# Patient Record
Sex: Male | Born: 1935 | Race: White | Hispanic: No | Marital: Married | Smoking: Former smoker
Health system: Southern US, Community
[De-identification: ages and names within clinical notes are randomized; demographics above are authoritative.]

## PROBLEM LIST (undated history)

## (undated) DIAGNOSIS — R011 Cardiac murmur, unspecified: Secondary | ICD-10-CM

## (undated) DIAGNOSIS — K219 Gastro-esophageal reflux disease without esophagitis: Secondary | ICD-10-CM

## (undated) DIAGNOSIS — D332 Benign neoplasm of brain, unspecified: Secondary | ICD-10-CM

## (undated) DIAGNOSIS — E785 Hyperlipidemia, unspecified: Secondary | ICD-10-CM

## (undated) DIAGNOSIS — I10 Essential (primary) hypertension: Secondary | ICD-10-CM

## (undated) DIAGNOSIS — J302 Other seasonal allergic rhinitis: Secondary | ICD-10-CM

## (undated) DIAGNOSIS — M1990 Unspecified osteoarthritis, unspecified site: Secondary | ICD-10-CM

## (undated) DIAGNOSIS — J449 Chronic obstructive pulmonary disease, unspecified: Secondary | ICD-10-CM

## (undated) HISTORY — DX: Benign neoplasm of brain, unspecified: D33.2

## (undated) HISTORY — PX: ORIF ANKLE FRACTURE: SUR919

## (undated) HISTORY — PX: HERNIA REPAIR: SHX51

## (undated) HISTORY — DX: Hyperlipidemia, unspecified: E78.5

---

## 2002-01-05 LAB — HM COLONOSCOPY

## 2006-10-29 ENCOUNTER — Emergency Department: Payer: Self-pay | Admitting: Emergency Medicine

## 2006-10-29 ENCOUNTER — Other Ambulatory Visit: Payer: Self-pay

## 2006-12-09 ENCOUNTER — Emergency Department: Payer: Self-pay | Admitting: Emergency Medicine

## 2008-07-22 ENCOUNTER — Ambulatory Visit: Payer: Self-pay | Admitting: Family Medicine

## 2008-10-18 ENCOUNTER — Ambulatory Visit: Payer: Self-pay | Admitting: Family Medicine

## 2012-03-15 ENCOUNTER — Ambulatory Visit: Payer: Self-pay | Admitting: Internal Medicine

## 2012-03-20 ENCOUNTER — Ambulatory Visit: Payer: Self-pay | Admitting: Family Medicine

## 2012-03-21 ENCOUNTER — Inpatient Hospital Stay: Payer: Self-pay | Admitting: Student

## 2012-03-21 ENCOUNTER — Ambulatory Visit: Payer: Self-pay | Admitting: Family Medicine

## 2012-03-21 LAB — CBC WITH DIFFERENTIAL/PLATELET
Eosinophil %: 1.4 %
Lymphocyte %: 14.4 %
Monocyte %: 8.2 %
Neutrophil %: 75.6 %
WBC: 8.6 10*3/uL (ref 3.8–10.6)

## 2012-03-21 LAB — URINALYSIS, COMPLETE
Bilirubin,UR: NEGATIVE
Glucose,UR: NEGATIVE mg/dL (ref 0–75)
Nitrite: NEGATIVE
Ph: 8 (ref 4.5–8.0)
Protein: NEGATIVE
Specific Gravity: 1.004 (ref 1.003–1.030)
WBC UR: 1 /HPF (ref 0–5)

## 2012-03-21 LAB — HEPATIC FUNCTION PANEL A (ARMC)
Albumin: 3.3 g/dL — ABNORMAL LOW (ref 3.4–5.0)
Alkaline Phosphatase: 98 U/L (ref 50–136)
Bilirubin, Direct: 0.2 mg/dL (ref 0.00–0.20)
Bilirubin,Total: 0.6 mg/dL (ref 0.2–1.0)
SGOT(AST): 16 U/L (ref 15–37)
SGPT (ALT): 12 U/L
Total Protein: 7.4 g/dL (ref 6.4–8.2)

## 2012-03-21 LAB — BASIC METABOLIC PANEL
Calcium, Total: 8.6 mg/dL (ref 8.5–10.1)
Chloride: 103 mmol/L (ref 98–107)
Co2: 27 mmol/L (ref 21–32)
Creatinine: 2.17 mg/dL — ABNORMAL HIGH (ref 0.60–1.30)
EGFR (African American): 33 — ABNORMAL LOW
Osmolality: 282 (ref 275–301)
Potassium: 3.5 mmol/L (ref 3.5–5.1)

## 2012-03-22 ENCOUNTER — Ambulatory Visit: Payer: Self-pay | Admitting: Urology

## 2012-03-22 LAB — CBC WITH DIFFERENTIAL/PLATELET
Eosinophil %: 2.5 %
Lymphocyte %: 23.5 %
MCH: 30 pg (ref 26.0–34.0)
Monocyte #: 0.9 x10 3/mm (ref 0.2–1.0)
Monocyte %: 11.4 %
Neutrophil #: 4.7 10*3/uL (ref 1.4–6.5)
Platelet: 149 10*3/uL — ABNORMAL LOW (ref 150–440)

## 2012-03-22 LAB — BASIC METABOLIC PANEL
Anion Gap: 9 (ref 7–16)
Calcium, Total: 8.5 mg/dL (ref 8.5–10.1)
Chloride: 106 mmol/L (ref 98–107)
EGFR (African American): 33 — ABNORMAL LOW
EGFR (Non-African Amer.): 28 — ABNORMAL LOW
Glucose: 94 mg/dL (ref 65–99)
Osmolality: 281 (ref 275–301)

## 2012-03-23 LAB — BASIC METABOLIC PANEL
Chloride: 109 mmol/L — ABNORMAL HIGH (ref 98–107)
Co2: 23 mmol/L (ref 21–32)
EGFR (African American): 33 — ABNORMAL LOW
EGFR (Non-African Amer.): 29 — ABNORMAL LOW
Potassium: 3.4 mmol/L — ABNORMAL LOW (ref 3.5–5.1)

## 2012-03-24 LAB — BASIC METABOLIC PANEL
Anion Gap: 9 (ref 7–16)
BUN: 25 mg/dL — ABNORMAL HIGH (ref 7–18)
Chloride: 107 mmol/L (ref 98–107)
Co2: 25 mmol/L (ref 21–32)
EGFR (African American): 32 — ABNORMAL LOW
EGFR (Non-African Amer.): 28 — ABNORMAL LOW
Glucose: 97 mg/dL (ref 65–99)
Osmolality: 286 (ref 275–301)
Potassium: 3.7 mmol/L (ref 3.5–5.1)
Sodium: 141 mmol/L (ref 136–145)

## 2012-03-24 LAB — PSA: PSA: 2.3 ng/mL (ref 0.0–4.0)

## 2012-03-25 ENCOUNTER — Other Ambulatory Visit: Payer: Self-pay | Admitting: Gastroenterology

## 2012-03-25 LAB — BASIC METABOLIC PANEL
Anion Gap: 7 (ref 7–16)
Chloride: 107 mmol/L (ref 98–107)
EGFR (Non-African Amer.): 28 — ABNORMAL LOW
Glucose: 108 mg/dL — ABNORMAL HIGH (ref 65–99)
Potassium: 3.8 mmol/L (ref 3.5–5.1)

## 2012-03-26 LAB — BASIC METABOLIC PANEL
Anion Gap: 8 (ref 7–16)
Osmolality: 281 (ref 275–301)
Potassium: 4.1 mmol/L (ref 3.5–5.1)

## 2012-03-27 ENCOUNTER — Encounter (HOSPITAL_COMMUNITY)
Admission: RE | Disposition: A | Discharge status: Home / Self Care | Payer: Self-pay | Source: Ambulatory Visit | Attending: Gastroenterology

## 2012-03-27 ENCOUNTER — Ambulatory Visit (HOSPITAL_COMMUNITY)
Admission: RE | Admit: 2012-03-27 | Discharge: 2012-03-27 | Disposition: A | Discharge status: Home / Self Care | Payer: Medicare Other | Source: Ambulatory Visit | Attending: Gastroenterology | Admitting: Gastroenterology

## 2012-03-27 ENCOUNTER — Encounter (HOSPITAL_COMMUNITY): Payer: Self-pay | Admitting: *Deleted

## 2012-03-27 ENCOUNTER — Ambulatory Visit (HOSPITAL_COMMUNITY): Payer: Medicare Other | Admitting: *Deleted

## 2012-03-27 ENCOUNTER — Encounter (HOSPITAL_COMMUNITY): Payer: Self-pay | Admitting: Gastroenterology

## 2012-03-27 DIAGNOSIS — R109 Unspecified abdominal pain: Secondary | ICD-10-CM | POA: Insufficient documentation

## 2012-03-27 DIAGNOSIS — K219 Gastro-esophageal reflux disease without esophagitis: Secondary | ICD-10-CM | POA: Insufficient documentation

## 2012-03-27 DIAGNOSIS — K869 Disease of pancreas, unspecified: Secondary | ICD-10-CM | POA: Insufficient documentation

## 2012-03-27 DIAGNOSIS — I10 Essential (primary) hypertension: Secondary | ICD-10-CM | POA: Insufficient documentation

## 2012-03-27 DIAGNOSIS — R634 Abnormal weight loss: Secondary | ICD-10-CM | POA: Insufficient documentation

## 2012-03-27 DIAGNOSIS — J4489 Other specified chronic obstructive pulmonary disease: Secondary | ICD-10-CM | POA: Insufficient documentation

## 2012-03-27 DIAGNOSIS — J449 Chronic obstructive pulmonary disease, unspecified: Secondary | ICD-10-CM | POA: Insufficient documentation

## 2012-03-27 DIAGNOSIS — R933 Abnormal findings on diagnostic imaging of other parts of digestive tract: Secondary | ICD-10-CM

## 2012-03-27 DIAGNOSIS — I779 Disorder of arteries and arterioles, unspecified: Secondary | ICD-10-CM | POA: Insufficient documentation

## 2012-03-27 HISTORY — DX: Cardiac murmur, unspecified: R01.1

## 2012-03-27 HISTORY — PX: FINE NEEDLE ASPIRATION: SHX5430

## 2012-03-27 HISTORY — DX: Unspecified osteoarthritis, unspecified site: M19.90

## 2012-03-27 HISTORY — PX: EUS: SHX5427

## 2012-03-27 HISTORY — DX: Chronic obstructive pulmonary disease, unspecified: J44.9

## 2012-03-27 HISTORY — DX: Gastro-esophageal reflux disease without esophagitis: K21.9

## 2012-03-27 HISTORY — DX: Other seasonal allergic rhinitis: J30.2

## 2012-03-27 HISTORY — DX: Essential (primary) hypertension: I10

## 2012-03-27 LAB — COMPREHENSIVE METABOLIC PANEL
ALT: 12 U/L (ref 0–53)
AST: 14 U/L (ref 0–37)
Alkaline Phosphatase: 79 U/L (ref 39–117)
CO2: 22 mEq/L (ref 19–32)
GFR calc Af Amer: 32 mL/min — ABNORMAL LOW (ref >90)
Glucose, Bld: 106 mg/dL — ABNORMAL HIGH (ref 70–99)
Potassium: 3.8 mEq/L (ref 3.5–5.1)
Sodium: 136 mEq/L (ref 135–145)
Total Protein: 7.5 g/dL (ref 6.0–8.3)

## 2012-03-27 LAB — CBC
Hemoglobin: 11.4 g/dL — ABNORMAL LOW (ref 13.0–17.0)
RBC: 3.85 MIL/uL — ABNORMAL LOW (ref 4.22–5.81)

## 2012-03-27 SURGERY — UPPER ENDOSCOPIC ULTRASOUND (EUS) LINEAR
Anesthesia: Monitor Anesthesia Care

## 2012-03-27 MED ORDER — LIDOCAINE HCL (CARDIAC) 20 MG/ML IV SOLN
INTRAVENOUS | Status: DC | PRN
  Administered 2012-03-27: 30 mg via INTRAVENOUS

## 2012-03-27 MED ORDER — ONDANSETRON HCL 4 MG/2ML IJ SOLN
INTRAMUSCULAR | Status: DC | PRN
  Administered 2012-03-27: 4 mg via INTRAVENOUS

## 2012-03-27 MED ORDER — KETAMINE HCL 10 MG/ML IJ SOLN
INTRAMUSCULAR | Status: DC | PRN
  Administered 2012-03-27 (×7): 5 mg via INTRAVENOUS
  Administered 2012-03-27: 10 mg via INTRAVENOUS
  Administered 2012-03-27 (×3): 5 mg via INTRAVENOUS
  Administered 2012-03-27: 15 mg via INTRAVENOUS
  Administered 2012-03-27 (×4): 5 mg via INTRAVENOUS
  Administered 2012-03-27: 30 mg via INTRAVENOUS

## 2012-03-27 MED ORDER — PROPOFOL 10 MG/ML IV EMUL
INTRAVENOUS | Status: DC | PRN
  Administered 2012-03-27: 300 ug/kg/min via INTRAVENOUS

## 2012-03-27 MED ORDER — LACTATED RINGERS IV SOLN
INTRAVENOUS | Status: DC | PRN
  Administered 2012-03-27 (×2): via INTRAVENOUS

## 2012-03-27 MED ORDER — BUTAMBEN-TETRACAINE-BENZOCAINE 2-2-14 % EX AERO
INHALATION_SPRAY | CUTANEOUS | Status: DC | PRN
  Administered 2012-03-27: 2 via TOPICAL

## 2012-03-27 MED ORDER — GLYCOPYRROLATE 0.2 MG/ML IJ SOLN
INTRAMUSCULAR | Status: DC | PRN
  Administered 2012-03-27 (×4): .025 mg via INTRAVENOUS

## 2012-03-27 MED ORDER — LACTATED RINGERS IV SOLN
INTRAVENOUS | Status: DC
  Administered 2012-03-27: 1000 mL via INTRAVENOUS

## 2012-03-27 NOTE — Anesthesia Postprocedure Evaluation (Signed)
  Anesthesia Post-op Note  Patient: Mark Sanchez  Procedure(s) Performed: Procedure(s) (LRB): UPPER ENDOSCOPIC ULTRASOUND (EUS) LINEAR (N/A) FINE NEEDLE ASPIRATION (FNA) LINEAR (N/A)  Patient Location: PACU  Anesthesia Type: MAC  Level of Consciousness: awake and alert   Airway and Oxygen Therapy: Patient Spontanous Breathing  Post-op Pain: mild  Post-op Assessment: Post-op Vital signs reviewed, Patient's Cardiovascular Status Stable, Respiratory Function Stable, Patent Airway and No signs of Nausea or vomiting  Post-op Vital Signs: stable  Complications: No apparent anesthesia complications

## 2012-03-27 NOTE — Discharge Instructions (Signed)

## 2012-03-27 NOTE — Anesthesia Preprocedure Evaluation (Addendum)
Anesthesia Evaluation  Patient identified by MRN, date of birth, ID band Patient awake    Reviewed: Allergy & Precautions, H&P , NPO status , Patient's Chart, lab work & pertinent test results  Airway Mallampati: III TM Distance: >3 FB Neck ROM: Full    Dental No notable dental hx.    Pulmonary neg pulmonary ROS, COPD breath sounds clear to auscultation  Pulmonary exam normal       Cardiovascular hypertension, Pt. on medications negative cardio ROS  Rhythm:Regular Rate:Normal     Neuro/Psych negative neurological ROS  negative psych ROS   GI/Hepatic negative GI ROS, Neg liver ROS, GERD-  ,  Endo/Other  negative endocrine ROS  Renal/GU negative Renal ROS  negative genitourinary   Musculoskeletal negative musculoskeletal ROS (+)   Abdominal   Peds negative pediatric ROS (+)  Hematology negative hematology ROS (+)   Anesthesia Other Findings Crowns upper front  Reproductive/Obstetrics negative OB ROS                           Anesthesia Physical Anesthesia Plan  ASA: II  Anesthesia Plan: MAC   Post-op Pain Management:    Induction: Intravenous  Airway Management Planned: Simple Face Mask  Additional Equipment:   Intra-op Plan:   Post-operative Plan:   Informed Consent: I have reviewed the patients History and Physical, chart, labs and discussed the procedure including the risks, benefits and alternatives for the proposed anesthesia with the patient or authorized representative who has indicated his/her understanding and acceptance.   Dental advisory given  Plan Discussed with: CRNA  Anesthesia Plan Comments:         Anesthesia Quick Evaluation

## 2012-03-27 NOTE — Op Note (Signed)
[facility] [address] [place], [place]  [address]  ENDOSCOPIC ULTRASOUND PROCEDURE REPORT  PATIENT:  Mark Sanchez, Mark Sanchez  MR#:  [id number] BIRTHDATE:  05/16/1933  GENDER:  male ENDOSCOPIST:  Rachael Fee, MD REFERRED BY:  Janese Banks, M.D. PROCEDURE DATE:  03/27/2012 PROCEDURE:  Upper EUS w/FNA ASA CLASS:  Class III INDICATIONS:  abd pain for 2-3 months, weight loss, recent PET scan showing PET avid soft tissue around aorta and PET avid "mass" in body, tail of pancreas MEDICATIONS:   MAC sedation, administered by CRNA  DESCRIPTION OF PROCEDURE:   After the risks benefits and alternatives of the procedure were  explained, informed consent was obtained. The patient was then placed in the left, lateral, decubitus postion and IV sedation was administered. Throughout the procedure, the patient's blood pressure, pulse and oxygen saturations were monitored continuously.  Under direct visualization, the 110036 endoscope was introduced through the mouth and advanced to the second portion of the duodenum.  Water was used as necessary to provide an acoustic interface.  Upon completion of the imaging, water was removed and the patient was sent to the recovery room in satisfactory condition. <<PROCEDUREIMAGES>>  Endoscopic findings (with radial and linear echoendoscopes): 1. Normal UGI tract  EUS findings: 1. Poorly defined hypoechoic mass in body, tail of pancreas. This measures at least 2.5cm but perhaps as much as 5cm (borders of mass very ill-defined).  This surround splenic vessles but does not clearly involve the portal vein, SMV, celiac trunk or SMA. The mass was sampled with 5 passes of a 19 gauge BS EUS FNA needle using color doppler to avoid significant vessels. 2. Main pancreatic duct was normal; non-dilated 3. No peripancreatic or celiac adenopathy 4. There was soft tissue surrounding abdominal aorta, 6-31mm thick. 5. Limited views of liver, spleen, portal  and splenic vessels were all normal  Impression: Poorly defined mass in body, tail of pancreas that measures 2.5cm to 5cm (indistinct borers).  This surrounds splenic vesses but does not appear to involve SMV, PV, SMA, or celiac trunk.  No peripancreatic or celiac adenopathy. Preliminary cytology review shows no clearly malignant cells. Await final cytology.  ______________________________ Rachael Fee, MD  n. eSIGNED:   Rachael Fee at 03/27/2012 10:26 AM  [name], [id number]

## 2012-03-27 NOTE — Transfer of Care (Signed)
Immediate Anesthesia Transfer of Care Note  Patient: Mark Sanchez  Procedure(s) Performed: Procedure(s) (LRB): UPPER ENDOSCOPIC ULTRASOUND (EUS) LINEAR (N/A)  Patient Location: PACU and Endoscopy Unit  Anesthesia Type: MAC  Level of Consciousness: sedated, ventilating well with OAW  Airway & Oxygen Therapy: Patient Spontanous Breathing  Post-op Assessment: Report given to PACU RN, Post -op Vital signs reviewed and stable and Patient moving all extremities  Post vital signs: Reviewed and stable  Complications: No apparent anesthesia complications

## 2012-03-27 NOTE — Preoperative (Signed)
Beta Blockers   Reason not to administer Beta Blockers:Not Applicable, pt took BB at home this am

## 2012-03-27 NOTE — H&P (Signed)
  HPI: This is a nice 78 yo man with abd pain (3-4 months) weight loss, abnormal PET uptake in pancreas    Past Medical History  Diagnosis Date  . Hypertension   . COPD (chronic obstructive pulmonary disease)   . Seasonal allergies   . Heart murmur   . GERD (gastroesophageal reflux disease)   . Arthritis     left ankle    Past Surgical History  Procedure Date  . Hernia repair   . Orif ankle fracture     Current Facility-Administered Medications  Medication Dose Route Frequency Provider Last Rate Last Dose  . lactated ringers infusion   Intravenous Continuous Rachael Fee, MD 20 mL/hr at 03/27/12 0744 1,000 mL at 03/27/12 0744    Allergies as of 03/25/2012  . (Not on File)    No family history on file.  History   Social History  . Marital Status: Married    Spouse Name: N/A    Number of Children: N/A  . Years of Education: N/A   Occupational History  . Not on file.   Social History Main Topics  . Smoking status: Not on file  . Smokeless tobacco: Not on file  . Alcohol Use:   . Drug Use:   . Sexually Active:    Other Topics Concern  . Not on file   Social History Narrative  . No narrative on file      Physical Exam: BP 153/76  Temp 98.2 F (36.8 C) (Oral)  Resp 15  Ht 5\' 11"  (1.803 m)  Wt 136 lb (61.689 kg)  BMI 18.97 kg/m2  SpO2 99% Constitutional: generally well-appearing Psychiatric: alert and oriented x3 Abdomen: soft, nontender, nondistended, no obvious ascites, no peritoneal signs, normal bowel sounds     Assessment and plan: 78 y.o. male with PET avid panacreas, weight loss, abd pain   for upper EUS, fna today

## 2012-03-28 ENCOUNTER — Encounter (HOSPITAL_COMMUNITY): Payer: Self-pay | Admitting: Gastroenterology

## 2012-04-01 ENCOUNTER — Ambulatory Visit: Payer: Self-pay | Admitting: Internal Medicine

## 2012-04-01 LAB — COMPREHENSIVE METABOLIC PANEL
Bilirubin,Total: 0.3 mg/dL (ref 0.2–1.0)
Co2: 26 mmol/L (ref 21–32)
EGFR (Non-African Amer.): 46 — ABNORMAL LOW
Potassium: 4.4 mmol/L (ref 3.5–5.1)
SGPT (ALT): 20 U/L
Total Protein: 7.5 g/dL (ref 6.4–8.2)

## 2012-04-01 LAB — CBC CANCER CENTER
Basophil %: 0.8 %
Eosinophil #: 0.7 x10 3/mm (ref 0.0–0.7)
Eosinophil %: 7 %
HCT: 33.1 % — ABNORMAL LOW (ref 40.0–52.0)
Lymphocyte #: 1.4 x10 3/mm (ref 1.0–3.6)
Lymphocyte %: 15.1 %
MCH: 29.5 pg (ref 26.0–34.0)
MCHC: 32.6 g/dL (ref 32.0–36.0)
MCV: 91 fL (ref 80–100)
Monocyte #: 0.9 x10 3/mm (ref 0.2–1.0)
Monocyte %: 9.9 %
Neutrophil #: 6.3 x10 3/mm (ref 1.4–6.5)
Neutrophil %: 67.2 %
Platelet: 200 x10 3/mm (ref 150–440)
WBC: 9.4 x10 3/mm (ref 3.8–10.6)

## 2012-04-01 LAB — SEDIMENTATION RATE: Erythrocyte Sed Rate: 33 mm/hr — ABNORMAL HIGH (ref 0–20)

## 2012-04-01 LAB — APTT: Activated PTT: 35.6 secs (ref 23.6–35.9)

## 2012-04-02 LAB — CEA: CEA: 0.7 ng/mL (ref 0.0–4.7)

## 2012-04-04 ENCOUNTER — Ambulatory Visit: Payer: Self-pay | Admitting: General Surgery

## 2012-04-07 LAB — PATHOLOGY REPORT

## 2012-04-14 ENCOUNTER — Ambulatory Visit: Payer: Self-pay | Admitting: Internal Medicine

## 2012-06-23 ENCOUNTER — Ambulatory Visit: Payer: Self-pay | Admitting: Rheumatology

## 2012-07-03 ENCOUNTER — Ambulatory Visit: Payer: Self-pay | Admitting: Urology

## 2012-07-03 LAB — CBC WITH DIFFERENTIAL/PLATELET
Bands: 10 %
Comment - H1-Com1: NORMAL
HGB: 12.3 g/dL — ABNORMAL LOW (ref 13.0–18.0)
Lymphocytes: 8 %
MCH: 31.3 pg (ref 26.0–34.0)
Metamyelocyte: 2 %
Monocytes: 1 %
Myelocyte: 3 %
Platelet: 164 10*3/uL (ref 150–440)
RBC: 3.94 10*6/uL — ABNORMAL LOW (ref 4.40–5.90)
Segmented Neutrophils: 76 %
WBC: 13.6 10*3/uL — ABNORMAL HIGH (ref 3.8–10.6)

## 2012-07-03 LAB — BASIC METABOLIC PANEL
Anion Gap: 7 (ref 7–16)
BUN: 23 mg/dL — ABNORMAL HIGH (ref 7–18)
Creatinine: 0.73 mg/dL (ref 0.60–1.30)
Glucose: 118 mg/dL — ABNORMAL HIGH (ref 65–99)
Potassium: 4 mmol/L (ref 3.5–5.1)

## 2012-07-07 ENCOUNTER — Ambulatory Visit: Payer: Self-pay | Admitting: Urology

## 2012-07-08 ENCOUNTER — Ambulatory Visit: Payer: Self-pay | Admitting: Internal Medicine

## 2012-07-15 ENCOUNTER — Ambulatory Visit: Payer: Self-pay | Admitting: Internal Medicine

## 2012-08-15 ENCOUNTER — Ambulatory Visit: Payer: Self-pay | Admitting: Internal Medicine

## 2012-09-19 ENCOUNTER — Ambulatory Visit: Payer: Self-pay | Admitting: Rheumatology

## 2013-01-05 ENCOUNTER — Ambulatory Visit: Payer: Self-pay | Admitting: Oncology

## 2013-01-06 LAB — CBC CANCER CENTER
Basophil #: 0.1 x10 3/mm (ref 0.0–0.1)
Basophil %: 0.6 %
Eosinophil %: 4.3 %
HCT: 39.6 % — ABNORMAL LOW (ref 40.0–52.0)
HGB: 13.5 g/dL (ref 13.0–18.0)
Lymphocyte %: 16.8 %
MCH: 30.2 pg (ref 26.0–34.0)
MCHC: 34 g/dL (ref 32.0–36.0)
MCV: 89 fL (ref 80–100)
Monocyte #: 0.6 x10 3/mm (ref 0.2–1.0)
Neutrophil #: 6.4 x10 3/mm (ref 1.4–6.5)
Platelet: 162 x10 3/mm (ref 150–440)
WBC: 8.9 x10 3/mm (ref 3.8–10.6)

## 2013-01-06 LAB — COMPREHENSIVE METABOLIC PANEL
Alkaline Phosphatase: 88 U/L (ref 50–136)
Anion Gap: 6 — ABNORMAL LOW (ref 7–16)
Bilirubin,Total: 0.4 mg/dL (ref 0.2–1.0)
Calcium, Total: 8.7 mg/dL (ref 8.5–10.1)
Chloride: 105 mmol/L (ref 98–107)
EGFR (African American): >60
EGFR (Non-African Amer.): >60
Osmolality: 281 (ref 275–301)
Potassium: 4.6 mmol/L (ref 3.5–5.1)
SGOT(AST): 17 U/L (ref 15–37)
SGPT (ALT): 22 U/L (ref 12–78)
Sodium: 139 mmol/L (ref 136–145)
Total Protein: 7.2 g/dL (ref 6.4–8.2)

## 2013-01-13 ENCOUNTER — Ambulatory Visit: Payer: Self-pay | Admitting: Oncology

## 2013-07-24 ENCOUNTER — Ambulatory Visit: Payer: Self-pay | Admitting: Rheumatology

## 2013-08-27 IMAGING — US ULTRASOUND AORTA
1 series · 13 of 21 positions shown · non-contrast
Comparison: None

REASON FOR EXAM: CR  [id number]  Post Void Residual Volume for US Kidney Abn
CT Eval Aortic Aneu...
COMMENTS:

PROCEDURE:     KYROLLOS - KYROLLOS AORTA  - March 21, 2012 [DATE]
RESULT:     Ultrasound aorta
INDICATION: Evaluate for AAA
TECHNIQUE: Multiple gray-scale and color Doppler images of the abdominal
aorta obtained.

[Series 1: ultrasound aorta · 0.28mm/px · 13 of 21 slices shown]
[im 1/21]
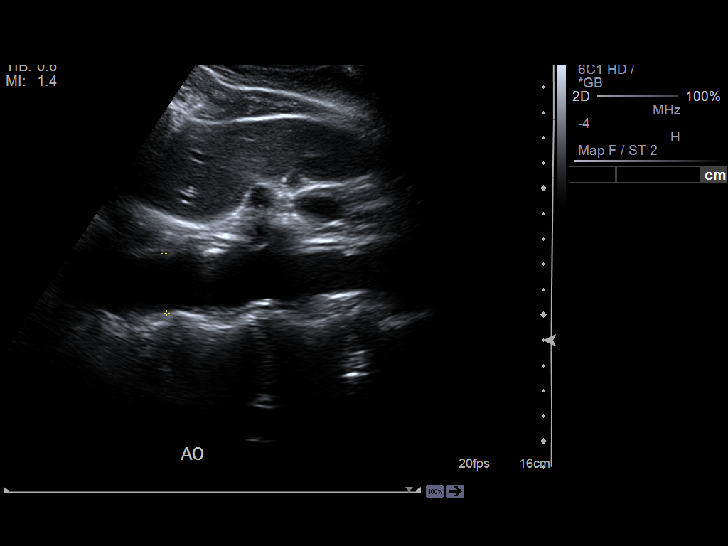
[im 3/21]
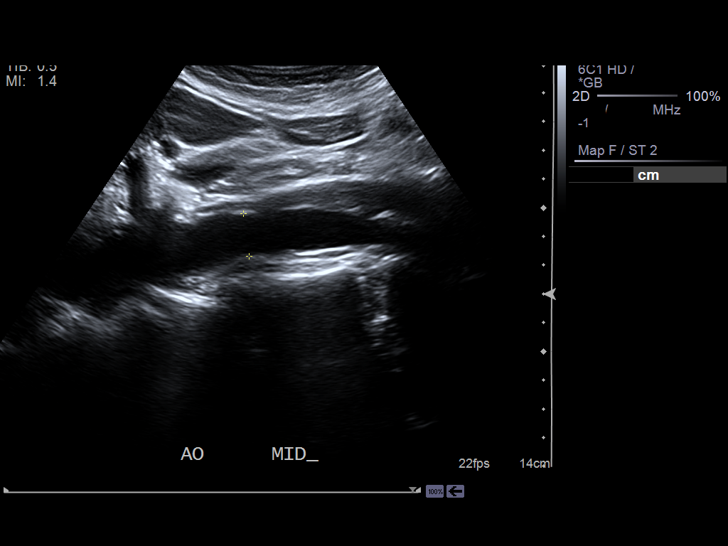
[im 5/21]
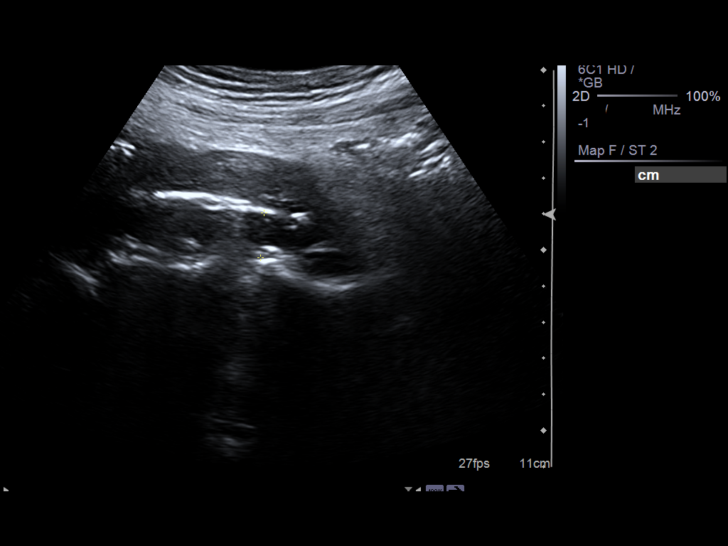
[im 6/21]
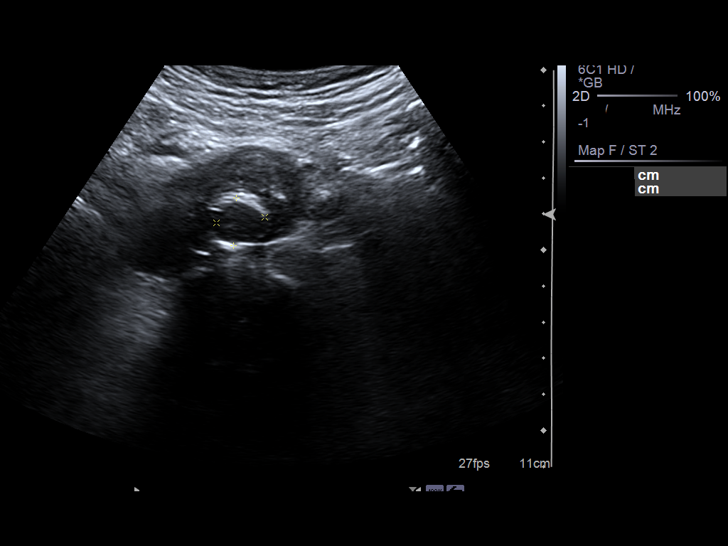
[im 8/21]
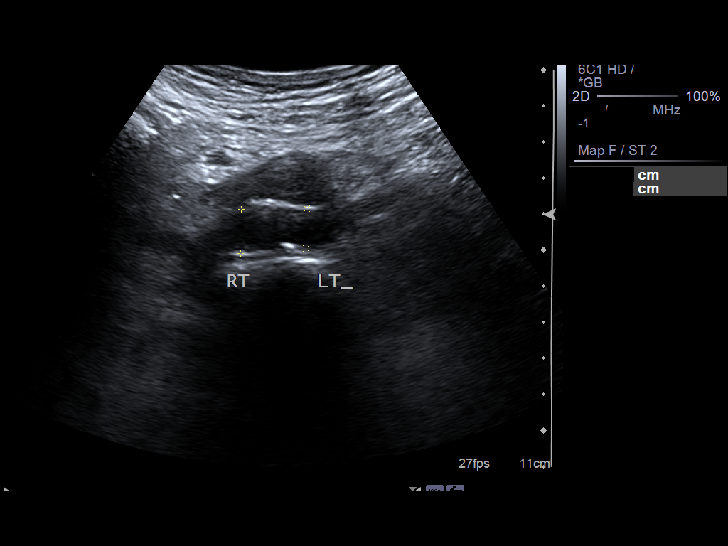
[im 9/21]
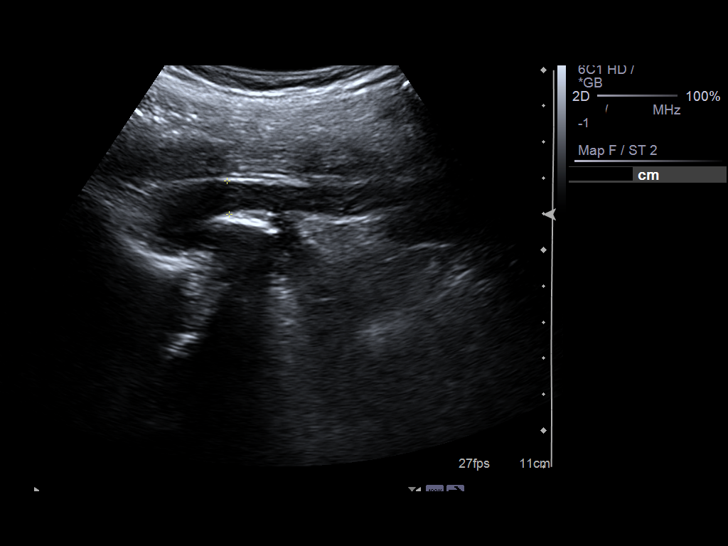
[im 11/21]
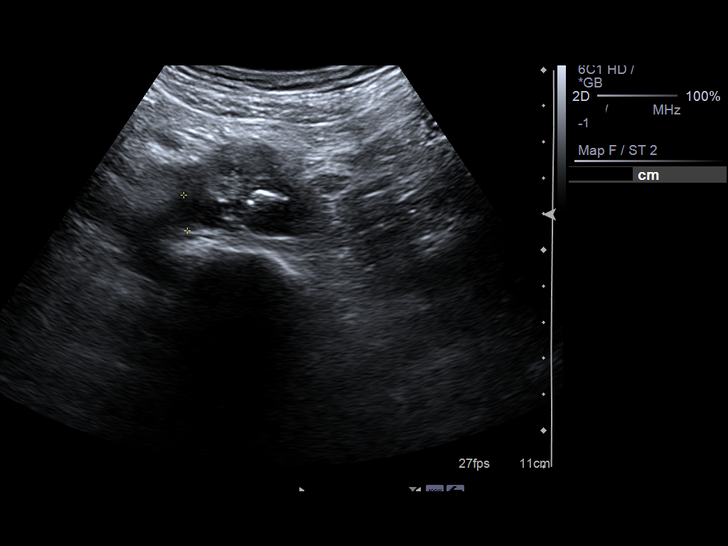
[im 13/21]
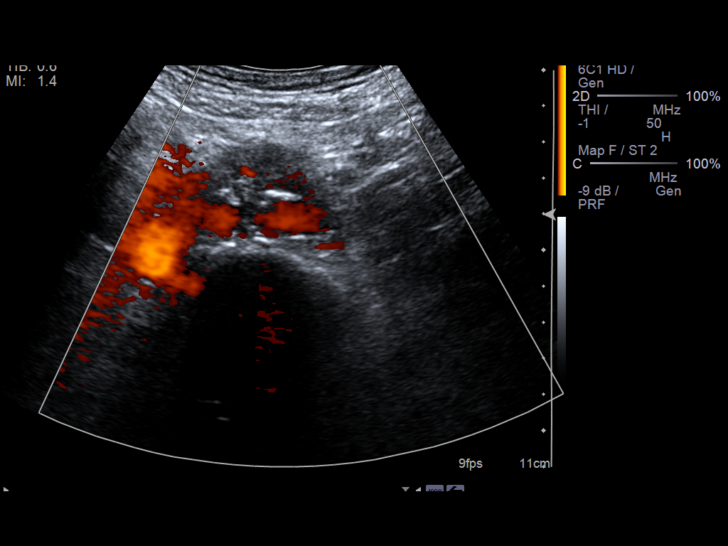
[im 14/21]
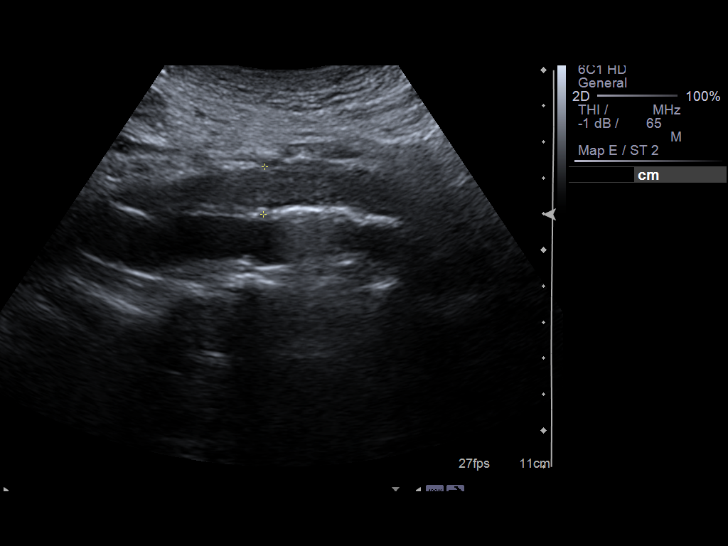
[im 16/21]
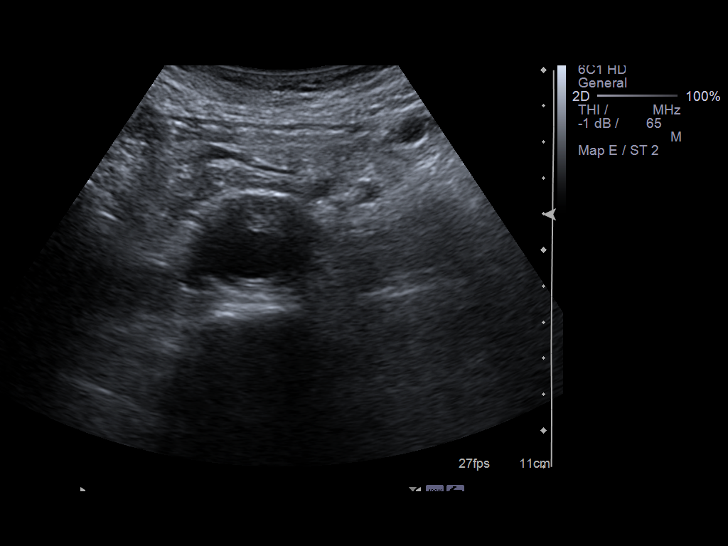
[im 17/21]
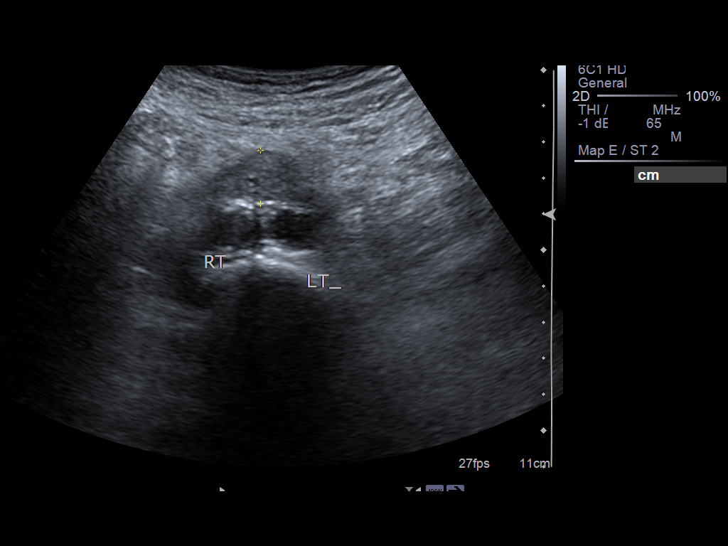
[im 19/21]
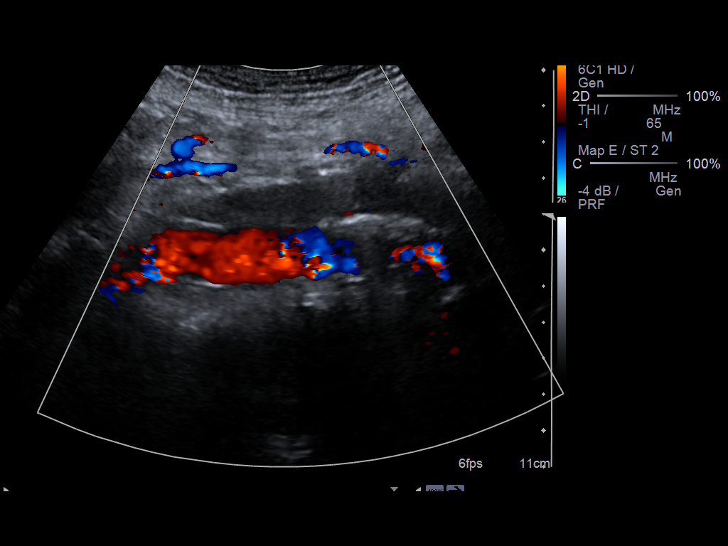
[im 21/21]
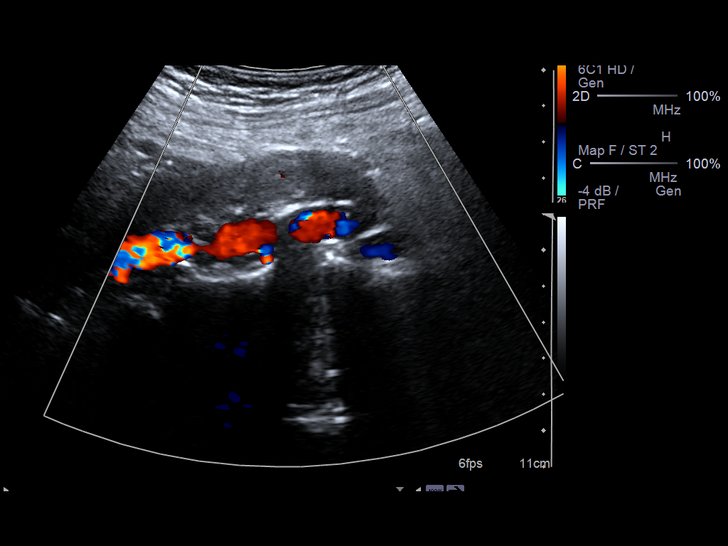

[13 of 21 positions shown; findings below may reference images not displayed]

FINDINGS: There is no evidence of abdominal aortic aneurysm. The proximal
segment of the abdominal aorta measures 2.3 x 2.5 cm, the mid abdominal
aorta measures 1.5 x 1.9 cm, and the distal abdominal aorta measures 1.2 x
1.3 cm in axial dimensions.

The common iliac arteries are normal in caliber without evidence of focal
aneurysmal dilatation. The right common iliac artery measures 1.2 cm. The
left common iliac artery measures 1.1 cm.

There is hypoechoic soft tissue a in oriented in a longitudinal fashion
surrounding the aorta down to the level of the iliac bifurcation with a
relative homogeneous appearance.
IMPRESSION: 1. No sonographic evidence of abdominal aortic aneurysm or aneurysmal
dilatation of the iliac arteries.

2. There is hypoechoic soft tissue a in oriented in a longitudinal fashion
surrounding the aorta down to the level of the iliac bifurcation with a
relative homogeneous appearance. This appearance can be seen with
retroperitoneal fibrosis versus lymphadenopathy. There is mild-moderate
bilateral hydronephrosis which may be resulting from this process.

## 2013-08-30 IMAGING — CT NM PET TUM IMG LTD AREA
5 series · 23 of 25 positions shown · non-contrast
Comparison: none

REASON FOR EXAM: retroperitoneal lymphadenopathy, unintentional weight
loss
COMMENTS:

[Series 3: ct wb 3.0 b30f · axial · 3.0mm · 0.98mm/px · z∈[-1482,-496]mm · 10 of 494 slices shown]
[im 1/494  soft-tissue]
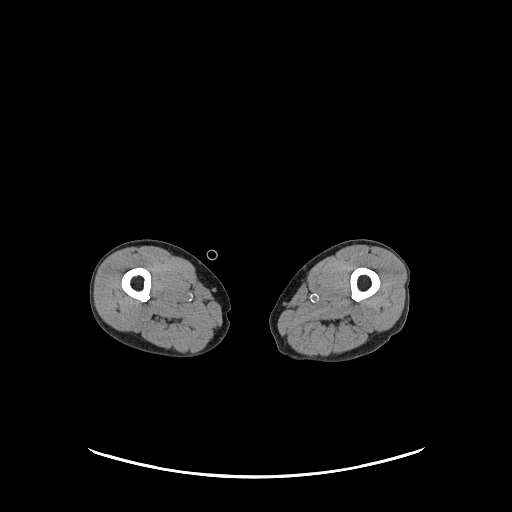
[im 50/494  soft-tissue]
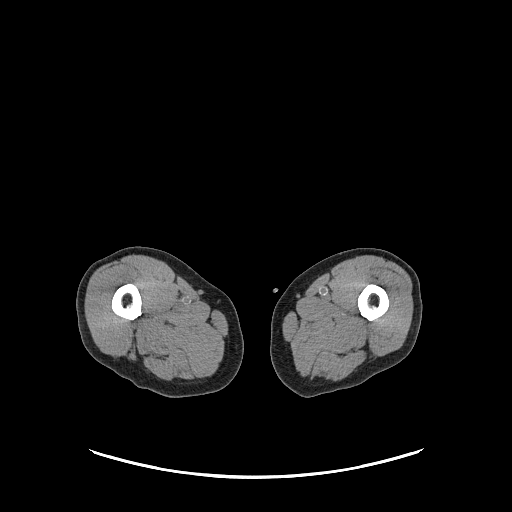
[im 99/494  soft-tissue]
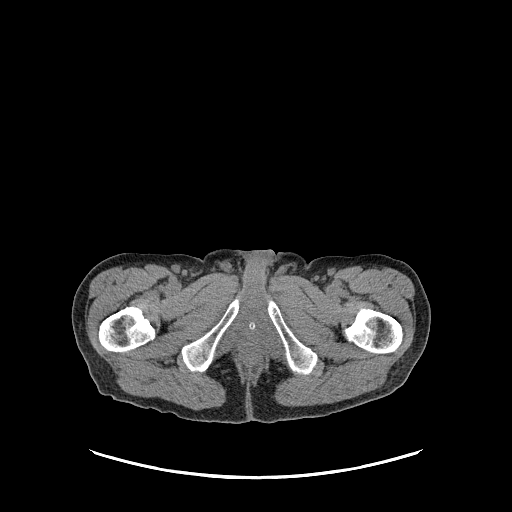
[im 148/494  soft-tissue]
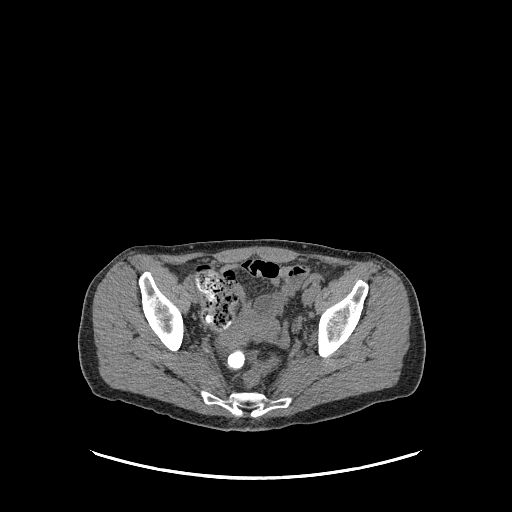
[im 198/494  soft-tissue]
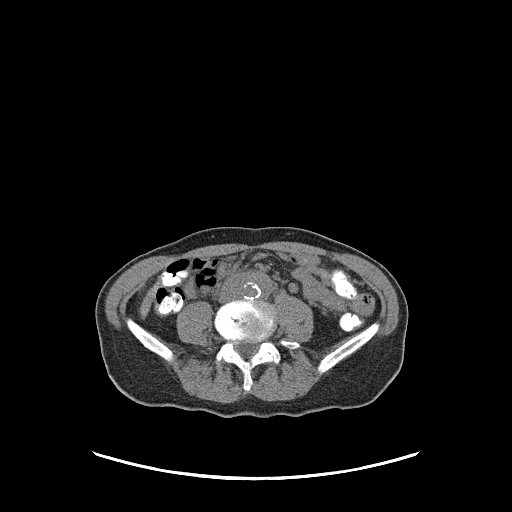
[im 247/494  soft-tissue]
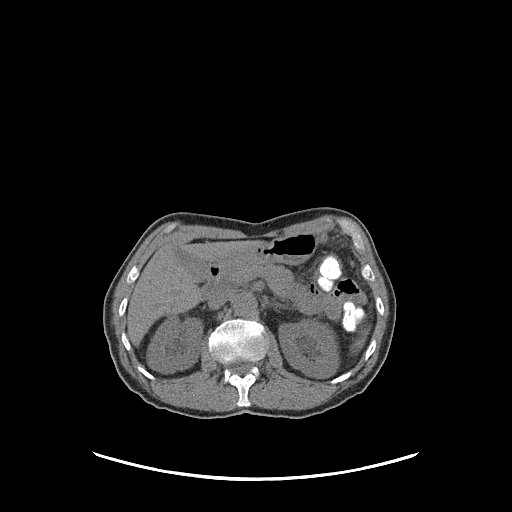
[im 346/494  soft-tissue]
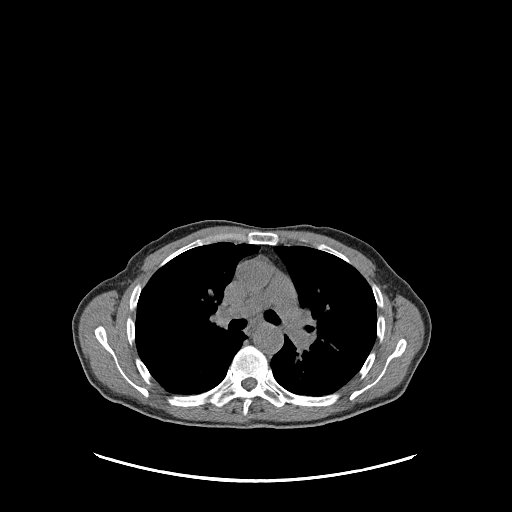
[im 395/494  soft-tissue]
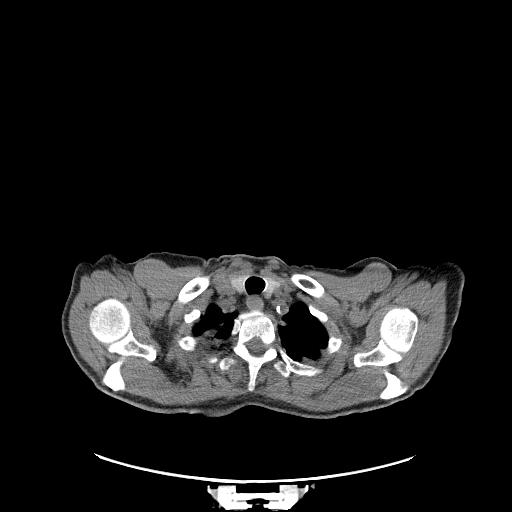
[im 444/494  soft-tissue]
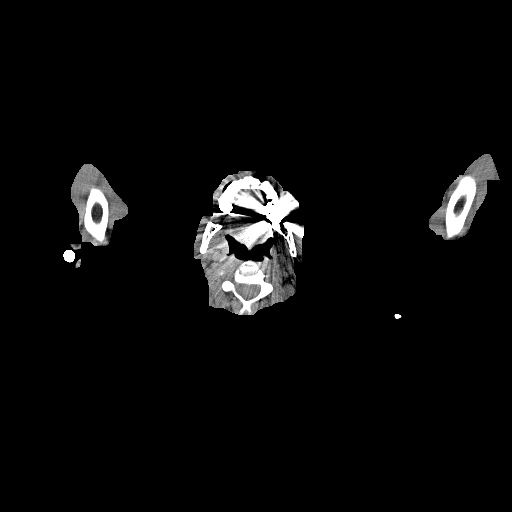
[im 494/494  soft-tissue]
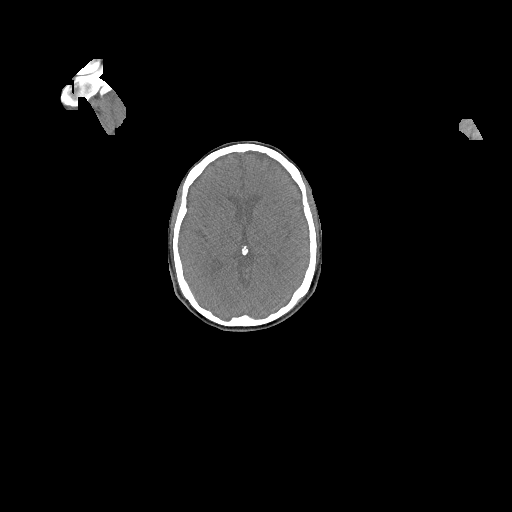

[Series 102: pet wb · axial · 5.0mm · 4.07mm/px · z∈[-1480,-496]mm · 7 of 329 slices shown]
[im 1/329]
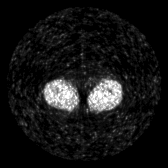
[im 55/329]
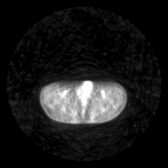
[im 110/329]
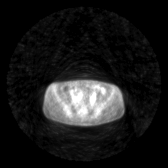
[im 165/329]
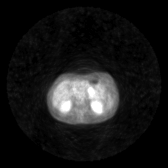
[im 219/329]
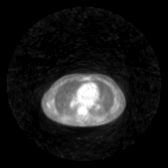
[im 274/329]
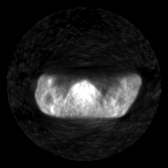
[im 329/329]
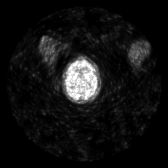

[Series 803: pet axial · 3 of 193 slices shown]
[im 65/193]
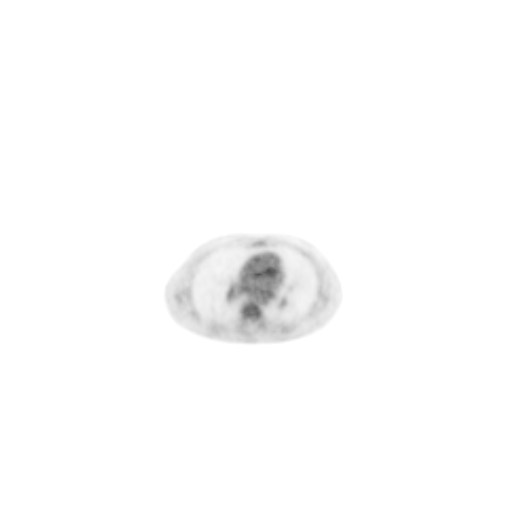
[im 129/193]
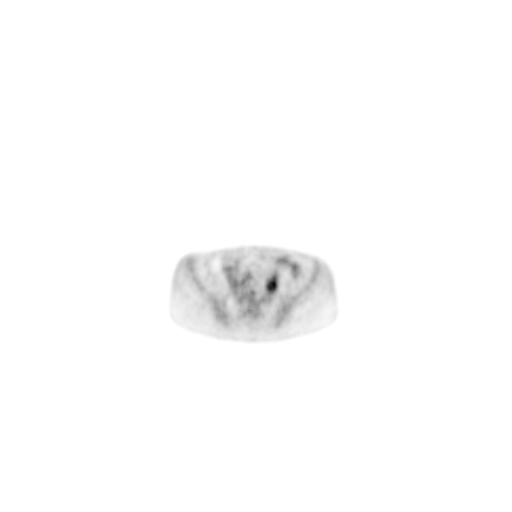
[im 193/193]
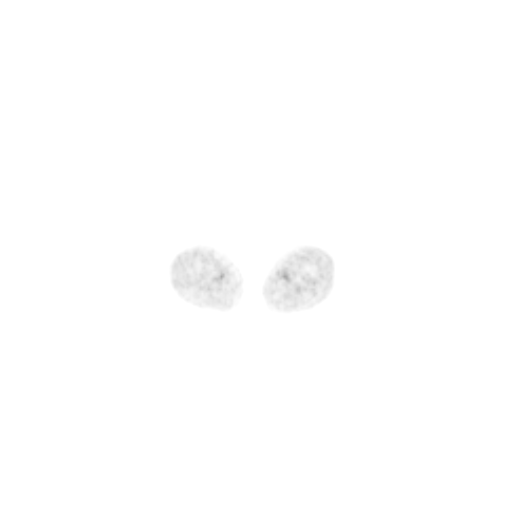

[Series 804: pet coronal · 1 of 59 slices shown]
[im 1/59]
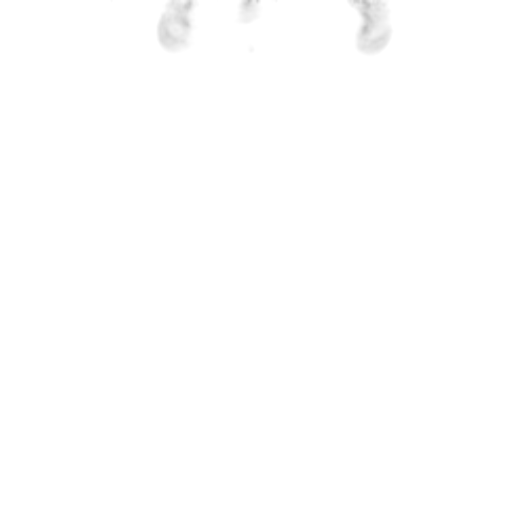

[Series 805: pet sagittal · 2 of 69 slices shown]
[im 1/69]
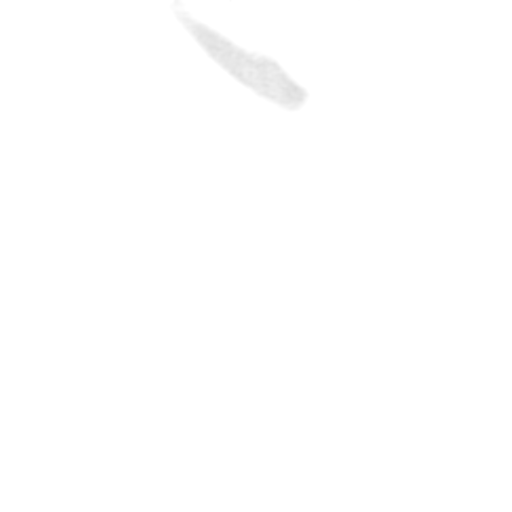
[im 69/69]
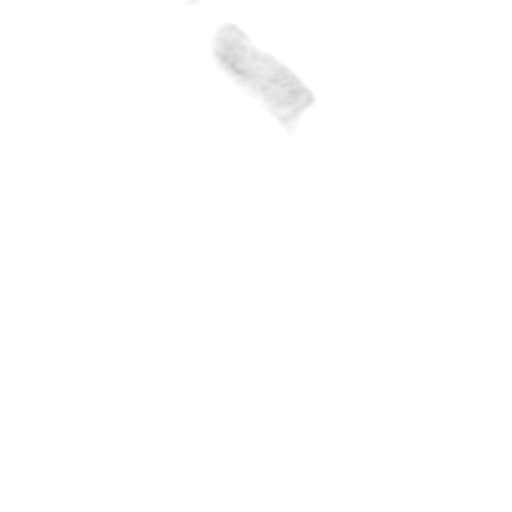

[23 of 25 positions shown; findings below may reference images not displayed]

PROCEDURE:     PET - PET/CT DX LYMPHOMA  - March 24, 2012  [DATE]

RESULT:     The patient has a fasting blood glucose level of 117 mg/dL. The
patient received an injection of 12.82 mCi of fluorine 18 labeled
fluorodeoxyglucose in the left forearm at [DATE] p.m. with imaging obtained
from the base of the brain into the thighs between [DATE] p.m. and [DATE] p.m.
Noncontrast low-dose CT is performed over the same regions for the purposes
of attenuation correction and fusion. Correlation is made with previous
diagnostic images of the chest, abdomen and pelvis dated March 20, 2012.

The periaortic soft tissue density shows accumulation of F-18 FDG consistent
with adenopathy. This extends into the common iliac regions to a mild
degree. Foley balloon is seen within the urinary bladder. There is some
persistent prominence of the renal collecting systems and ureters. SUV
representative measurement in the periaortic region shows a maximum uptake
of 4.85 with a mean of 4.31. Uptake is also present bilaterally in the
inguinal region. Coils are present from previous hernia repairs. The area of
uptake is superior to the inguinal lymph nodes. This could be artifact from
the coils in this region. There is also abnormal localization within the
pancreatic tail and body region. This has a maximum uptake value of
with a mean of 4.68 SUV. The neck and thoracic regions appear unremarkable.
There is some minimal uptake along the posterior aspect of the ascending
thoracic aorta with a maximum Hounsfield reading of 3.95 and mean of
SUV. This could be within a lymph node in the area or could be along the
wall of the aorta in an area of active atherosclerotic inflammation. No
significant hilar abnormal localization is seen. No axillary abnormal
localization is evident.
IMPRESSION: Soft tissue density in the periaortic and common iliac region shows evidence
of F-18 FDG accumulation. There is also abnormal accumulation within the
pancreatic body and tail concerning for malignancy rather than inflammation
but inflammation is not completely excluded. Peripancreatic fatty tissues
appear unremarkable.

[REDACTED]

## 2013-10-30 LAB — LIPID PANEL
Cholesterol: 153 mg/dL (ref 0–200)
HDL: 58 mg/dL (ref 35–70)
LDL Cholesterol: 90 mg/dL
Triglycerides: 61 mg/dL (ref 40–160)

## 2013-10-30 LAB — BASIC METABOLIC PANEL
BUN: 15 mg/dL (ref 4–21)
Creatinine: 0.8 mg/dL (ref 0.6–1.3)
GLUCOSE: 98 mg/dL
Potassium: 3.9 mmol/L (ref 3.4–5.3)
Sodium: 139 mmol/L (ref 137–147)

## 2013-10-30 LAB — HEPATIC FUNCTION PANEL
ALT: 20 U/L (ref 10–40)
AST: 11 U/L — AB (ref 14–40)
Alkaline Phosphatase: 93 U/L (ref 25–125)
Bilirubin, Total: 0.5 mg/dL

## 2013-10-30 LAB — CBC AND DIFFERENTIAL
HEMATOCRIT: 43 % (ref 41–53)
HEMOGLOBIN: 13.8 g/dL (ref 13.5–17.5)
Platelets: 160 10*3/uL (ref 150–399)
WBC: 8.4 10*3/mL

## 2013-10-30 LAB — TSH: TSH: 2.7 u[IU]/mL (ref 0.41–5.90)

## 2014-01-08 ENCOUNTER — Ambulatory Visit: Payer: Self-pay | Admitting: Oncology

## 2014-01-08 LAB — PSA: PSA: 2.8

## 2014-01-11 LAB — CBC CANCER CENTER
Basophil #: 0.1 x10 3/mm (ref 0.0–0.1)
Basophil %: 0.8 %
EOS ABS: 1 x10 3/mm — AB (ref 0.0–0.7)
Eosinophil %: 10.8 %
HCT: 42.2 % (ref 40.0–52.0)
HGB: 13.8 g/dL (ref 13.0–18.0)
LYMPHS ABS: 2.7 x10 3/mm (ref 1.0–3.6)
LYMPHS PCT: 28.7 %
MCH: 29.5 pg (ref 26.0–34.0)
MCHC: 32.6 g/dL (ref 32.0–36.0)
MCV: 91 fL (ref 80–100)
MONO ABS: 0.8 x10 3/mm (ref 0.2–1.0)
Monocyte %: 8.8 %
NEUTROS ABS: 4.7 x10 3/mm (ref 1.4–6.5)
Neutrophil %: 50.9 %
Platelet: 185 x10 3/mm (ref 150–440)
RBC: 4.66 10*6/uL (ref 4.40–5.90)
RDW: 13.9 % (ref 11.5–14.5)
WBC: 9.3 x10 3/mm (ref 3.8–10.6)

## 2014-01-11 LAB — COMPREHENSIVE METABOLIC PANEL
ANION GAP: 7 (ref 7–16)
AST: 15 U/L (ref 15–37)
Albumin: 3.5 g/dL (ref 3.4–5.0)
Alkaline Phosphatase: 81 U/L
BUN: 15 mg/dL (ref 7–18)
Bilirubin,Total: 0.5 mg/dL (ref 0.2–1.0)
Calcium, Total: 8.8 mg/dL (ref 8.5–10.1)
Chloride: 105 mmol/L (ref 98–107)
Co2: 29 mmol/L (ref 21–32)
Creatinine: 0.93 mg/dL (ref 0.60–1.30)
GLUCOSE: 95 mg/dL (ref 65–99)
OSMOLALITY: 282 (ref 275–301)
POTASSIUM: 4 mmol/L (ref 3.5–5.1)
SGPT (ALT): 16 U/L (ref 12–78)
Sodium: 141 mmol/L (ref 136–145)
Total Protein: 7.2 g/dL (ref 6.4–8.2)

## 2014-01-12 LAB — PROT IMMUNOELECTROPHORES(ARMC)

## 2014-01-12 LAB — KAPPA/LAMBDA FREE LIGHT CHAINS (ARMC)

## 2014-01-13 ENCOUNTER — Ambulatory Visit: Payer: Self-pay | Admitting: Oncology

## 2014-02-05 ENCOUNTER — Ambulatory Visit: Payer: Self-pay | Admitting: Rheumatology

## 2014-11-08 DIAGNOSIS — I340 Nonrheumatic mitral (valve) insufficiency: Secondary | ICD-10-CM | POA: Insufficient documentation

## 2014-11-08 DIAGNOSIS — K2100 Gastro-esophageal reflux disease with esophagitis, without bleeding: Secondary | ICD-10-CM | POA: Insufficient documentation

## 2014-11-19 DIAGNOSIS — I480 Paroxysmal atrial fibrillation: Secondary | ICD-10-CM | POA: Insufficient documentation

## 2014-11-21 DIAGNOSIS — I071 Rheumatic tricuspid insufficiency: Secondary | ICD-10-CM | POA: Insufficient documentation

## 2015-01-07 ENCOUNTER — Ambulatory Visit
Admit: 2015-01-07 | Disposition: A | Discharge status: Home / Self Care | Payer: Self-pay | Attending: Oncology | Admitting: Oncology

## 2015-01-10 LAB — CBC CANCER CENTER
BASOS PCT: 0.6 %
Basophil #: 0.1 x10 3/mm (ref 0.0–0.1)
Eosinophil #: 0.8 x10 3/mm — ABNORMAL HIGH (ref 0.0–0.7)
Eosinophil %: 8.6 %
HCT: 41.7 % (ref 40.0–52.0)
HGB: 14.1 g/dL (ref 13.0–18.0)
Lymphocyte #: 2.1 x10 3/mm (ref 1.0–3.6)
Lymphocyte %: 23.7 %
MCH: 30.2 pg (ref 26.0–34.0)
MCHC: 33.8 g/dL (ref 32.0–36.0)
MCV: 89 fL (ref 80–100)
Monocyte #: 0.7 x10 3/mm (ref 0.2–1.0)
Monocyte %: 7.6 %
NEUTROS PCT: 59.5 %
Neutrophil #: 5.3 x10 3/mm (ref 1.4–6.5)
Platelet: 190 x10 3/mm (ref 150–440)
RBC: 4.66 10*6/uL (ref 4.40–5.90)
RDW: 14.1 % (ref 11.5–14.5)
WBC: 8.8 x10 3/mm (ref 3.8–10.6)

## 2015-01-10 LAB — COMPREHENSIVE METABOLIC PANEL
ALK PHOS: 65 U/L
ANION GAP: 5 — AB (ref 7–16)
Albumin: 3.8 g/dL
BILIRUBIN TOTAL: 0.6 mg/dL
BUN: 18 mg/dL
CALCIUM: 8.4 mg/dL — AB
CO2: 26 mmol/L
CREATININE: 0.97 mg/dL
Chloride: 107 mmol/L
EGFR (Non-African Amer.): >60
Glucose: 106 mg/dL — ABNORMAL HIGH
Potassium: 3.8 mmol/L
SGOT(AST): 24 U/L
SGPT (ALT): 18 U/L
Sodium: 138 mmol/L
Total Protein: 6.7 g/dL

## 2015-01-10 LAB — LACTATE DEHYDROGENASE: LDH: 74 U/L — AB

## 2015-01-11 LAB — PROT IMMUNOELECTROPHORES(ARMC)

## 2015-01-14 ENCOUNTER — Ambulatory Visit
Admit: 2015-01-14 | Disposition: A | Discharge status: Home / Self Care | Payer: Self-pay | Attending: Oncology | Admitting: Oncology

## 2015-02-01 NOTE — Op Note (Signed)
PATIENT NAME:  Mark Sanchez, Mark Sanchez MR#:  [id number] DATE OF BIRTH:  Mar 02, 1933  DATE OF PROCEDURE:  07/07/2012  PREOPERATIVE DIAGNOSIS: Retroperitoneal fibrosis.   POSTOPERATIVE DIAGNOSIS: Retroperitoneal fibrosis.  PROCEDURE: Cystoscopy with bilateral stent retrieval.   SURGEON: Otelia Limes. Yves Dill, MD   ANESTHETISTS: Dr. Kayleen Memos and surgeon    ANESTHETIC METHOD: General per Dr. Kayleen Memos and local per Dr. Yves Dill    INDICATIONS: See the dictated history and physical. After informed consent, the patient requests the above procedure.   OPERATIVE SUMMARY: After adequate general anesthesia had been obtained, the patient was placed into dorsal lithotomy position and the perineum was prepped and draped in the usual fashion. The 21 French cystoscope was coupled with the camera and then visually advanced into the bladder. Both ureteral orifices were identified and had stents present. No bladder lesions were identified. At this point alligator forceps were passed through the scope and the right stent was removed. Next, the left stent was removed. Bladder was drained. 10 mL of viscous Xylocaine was instilled within the urethra and the bladder. The procedure was then terminated and the patient was transferred to the recovery room in stable condition.   ____________________________ Otelia Limes. Yves Dill, MD [email or web address] D: 07/07/2012 11:58:39 ET T: 07/07/2012 12:29:41 ET JOB#: [id number]  cc: Otelia Limes. Yves Dill, MD, <Dictator> Royston Cowper MD ELECTRONICALLY SIGNED 07/07/2012 13:42

## 2015-02-01 NOTE — H&P (Signed)
PATIENT NAME:  Mark, Sanchez MR#:  [id number] DATE OF BIRTH:  1933-09-22  DATE OF ADMISSION:  07/07/2012  CHIEF COMPLAINT: Bilateral hydronephrosis.   HISTORY OF PRESENT ILLNESS: Mark Sanchez is a 81 year old white male with retroperitoneal  fibrosis which was managed with stent placement and steroids. His fibrosis has resolved sufficiently that a rheumatologist has recommended stent retrieval. Creatinine is also normal now. He has no complaints of flank pain at this time.   ALLERGIES: No drug allergies.   CURRENT MEDICATIONS: Prednisone, Travatan, brimonidine eyedrops, amlodipine, aspirin, dorzolamide, timolol, fluticasone, latanoprost, losartan, metoprolol, ranitidine and terazosin.    PAST SURGICAL HISTORY:  1. Repair of dislocated left shoulder.  2. Left ankle fracture repair.  3. Bilateral inguinal herniorrhaphies.   SOCIAL HISTORY: The patient quit smoking cigars 10 years ago. He denied cigarette use. He consumes beer occasionally.   FAMILY HISTORY: Noncontributory.   PAST AND CURRENT MEDICAL CONDITIONS:  1. Retroperitoneal fibrosis with hydronephrosis.  2. Hypertension.  3. Osteoarthritis.  4. Benign prostatic hypertrophy. 5. Varicose veins.  6. Gastroesophageal reflux disease. 7. Right bundle branch block with supraventricular tachycardia.  8. Glaucoma. 9. Chronic obstructive pulmonary disease. 10. Allergic rhinitis.  REVIEW OF SYSTEMS: The patient denied shortness of breath, chest pain, diabetes, or stroke.   PHYSICAL EXAMINATION:  GENERAL: Well-nourished white male in no acute distress.   HEENT: Sclerae were clear. Pupils were equally round and reactive to light and accommodation. Extraocular movements were intact.   NECK: Supple. No palpable cervical adenopathy.   LUNGS: Clear to auscultation.   CARDIOVASCULAR: Regular rate and rhythm without audible murmurs.   ABDOMEN: Soft, nontender abdomen.   NEUROMUSCULAR: Alert and oriented x3. Nonfocal.    IMPRESSION: Bilateral hydronephrosis secondary to retroperitoneal fibrosis.   PLAN: Cystoscopy with stent retrieval.    ____________________________ Otelia Limes. Yves Dill, MD [email or web address] D: 07/03/2012 11:43:11 ET T: 07/03/2012 12:20:52 ET JOB#: [id number]  cc: Otelia Limes. Yves Dill, MD, <Dictator> Royston Cowper MD ELECTRONICALLY SIGNED 07/04/2012 9:36

## 2015-02-06 NOTE — Consult Note (Signed)
PATIENT NAME:  Mark Sanchez, Mark Sanchez MR#:  [id number] DATE OF BIRTH:  08-21-1933  DATE OF CONSULTATION:  03/22/2012  REFERRING PHYSICIAN:  Vivien Presto, MD CONSULTING PHYSICIAN:  Darcella Cheshire, MD  PRIMARY CARE PHYSICIAN: Miguel Aschoff, MD  REASON FOR CONSULTATION: Bilateral hydronephrosis with acute on chronic renal insufficiency.   HISTORY OF PRESENT ILLNESS: The patient is a 81 year old white male who was admitted to [facility] on 03/21/2012 for THE further evaluation and management of progressive weight loss, abdominal pain, and hydronephrosis with a retroperitoneal mass on an abdominal CT scan. Six weeks prior to admission, the patient reports a 48 hour "stomach virus" with marked nausea and vomiting with diarrhea. Since then the patient has developed a progressive sense of abdominal fullness with anorexia with intermittent nausea and hypersalivation. For the past two weeks, the patient has developed progressive lower back pain radiating to the abdomen diffusely described as gassy in nature with nocturnal exacerbations reaching a 9 on a scale of 10 lasting for up to 90 minutes. The pain occurs only at night when he is supine. The patient denies any pain during the daytime when he is upright. At night, when the patient experiences the pain, he reports that it is relieved with walking around and belching or passing flatus if possible. The patient also reports a one week history of constipation requiring suppositories x2 with what sounds like magnesium citrate p.o. x2 two to 3 days prior to admission with resultant small liquid brown stools with relief of rectal urgency. The patient denies any mucus or blood in the liquid stool. The patient does report one episode of emesis following his CT scan of the abdomen, on 03/20/2012. The patient also reports a six month history of an intermittent decrease in the caliber of his stools but denies any gross blood per rectum or dark tarry stools.  The patient reports a 14 pound weight loss over the past six months going from 150 pounds to a current weight of 136 pounds. The patient denies any dysuria, gross hematuria, fevers or chills, night sweats. The patient does endorse a slight increase in urinary frequency with voids at approximately every two hours with a progressive sense of incomplete bladder emptying. The patient had a CT scan on 03/20/2012 of the chest, abdomen and pelvis without contrast due to renal insufficiency. There was evidence of emphysema on the chest CT. On the abdominal CT, there was mild prominence of the collecting systems bilaterally with a moderate amount of urine in the bladder raising the question of voiding insufficiency. There was also a soft tissue density noted in the periaortic areas raising the question of a possible aneurysm versus adenopathy. 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 Mild to moderate hydronephrosis was appreciated along with a 2 cm interpolar left renal complex cystic mass. Bladder volume calculation prevoid was 45 mL with 0.6 mL post void. Upon admission, on 03/21/2012, an initial bladder scan was obtained which reported 354 mL, however, on an in and out catheterization only 80 mL of urine was obtained. Follow-up bladder scan at 8:50 p.m. revealed 115 mL and another follow-up bladder scan at 9:10 a.m., on 03/22/2012, revealed 53 mL. Serum creatinine on admission was 2.17, which was elevated compared to 1.56 in Dr. Alben Spittle office, on  03/17/2012. Follow-up creatinine on 03/22/2012, at 4:55 a.m., was 2.19. The patient was noted to be anemic on admission with a hematocrit of 33.5 and a white blood cell count of 8.6 thousand and a platelet count of 157. Follow up  hematocrit on 03/22/2012 was 32.6 with a white count of 7.6 thousand.   PAST MEDICAL HISTORY:  1. Hypertension with an acute increase with difficulty obtaining control over the past three months requiring the recent addition of losartan.  2. Post traumatic osteoarthritis of the right ankle.  3. History of benign prostatic hyperplasia.  4. Varicose veins.  5. Gastroesophageal reflux disease.  6. History of right bundle branch block with supraventricular tachycardia treated with metoprolol.  7. Glaucoma managed with three eyedrops.  8. Chronic obstructive pulmonary disease managed with a steroid inhaler at bedtime.  9. Allergic rhinitis.   MEDICATIONS:  1. Travatan ophthalmic at bedtime, 0.004%.  2. Brimonidine tartrate ophthalmic solution 0.2% one drop to each eye three times daily. 3. Amlodipine 5 mg p.o. daily.  4. Aspirin 81 mg p.o. daily.  5. Dorzolamide/timolol ophthalmic solution 2% twice a day, one drop.  6. Fluticasone 50 mcg one spray nasally at bedtime.  7. Latanoprost 0.005% ophthalmic solution one drop to each eye at bedtime.  8. Losartan 100 mg p.o. daily. (Held on admission). 9. Metoprolol tartrate 12.5 mg p.o. b.i.d.  10. Ranitidine 150 mg p.o. b.i.d.  11. Terazosin 5 mg p.o. at bedtime.  DRUG ALLERGIES: The patient reports a history of a possible rash to sulfa in the past   PAST SURGICAL HISTORY:  1. Left shoulder dislocation reduction under anesthesia.  2. Left ankle fracture repair utilizing bone graft 15 years prior to admission.  3. Bilateral inguinal hernia repairs 10 years prior to admission.   FAMILY HISTORY: Significant for prostate cancer in his father who was diagnosed at 25.   SOCIAL HISTORY: Tobacco - the patient is a former cigar smoker with 2 to 3 cigars a week for 40 years, which was discontinued 10 years ago. The patient denies any cigarette smoking. Alcohol - the patient reports having one half of beer per week. 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 14 pound weight loss over the past six months. HEENT: The patient reports occasional headaches, but no recent upper respiratory infections. CARDIOVASCULAR: The patient denies any chest pain or dyspnea on exertion. RESPIRATORY: The patient reports a history of mild chronic obstructive pulmonary disease with exertional cough, but no shortness of breath. GASTROINTESTINAL: As per the history of present illness. GENITOURINARY: As per the history of present illness plus the patient reports a history of urolithiasis 10 years prior to admission. MUSCULOSKELETAL: Chronic post traumatic arthritis of the left ankle. NEUROLOGIC: The patient denies any lateralizing weakness or history of stroke or transient ischemic attacks. PSYCHIATRIC: The patient denies any anxiety or depression. HEMATOLOGIC: The patient denies any abnormal bruising or bleeding tendency. LYMPHATIC: The patient denies any adenopathy. SKIN: The patient denies any rashes or lesions.   PHYSICAL EXAMINATION:   VITAL SIGNS: At 2:06 p.m. temperature 98.2 degrees Fahrenheit, pulse 89 and regular, respirations 20 and unlabored, blood pressure 130/57, and pulse oximetry 95% on room air.   GENERAL: The patient is a well-developed, well-nourished white male in no apparent distress.   HEENT: Normocephalic and atraumatic. Anicteric. Extraocular movements intact.   NECK: No masses or bruits.   LUNGS: Clear to auscultation  with normal respiratory effort.   HEART: Regular rate and rhythm with a grade 3/6 systolic murmur heard best at the left lower sternal border. No gallops or rubs. 1+ radial pulses bilaterally. Abdominal bruits are auscultated in the periumbilical region.   ABDOMEN: Soft and nontender and nondistended. No palpable abdominal masses. No costovertebral angle tenderness.   GENITOURINARY: Normal circumcised phallus without lesion or discharge.  Bilaterally descended testes, soft, nontender, and without masses. No inguinal hernias appreciated. Digital rectal examination of the prostate, normal sphincter tone, normal size prostate, nontender without nodules, and empty rectal fossa without masses or stool.   EXTREMITIES: Trace ankle edema bilaterally.   NEUROLOGIC: Nonfocal.   PSYCHIATRIC: Alert and oriented x4, pleasant and appropriate.   SKIN: No rashes or lesions about the head or neck.   LYMPHATIC: No cervical or supraclavicular or axillary or inguinal adenopathy.   LABS/STUDIES: Laboratory, diagnostic and radiological data as per the history of present illness   ASSESSMENT:  1. Bilateral hydronephrosis secondary to a retroperitoneal mass of uncertain etiology. The patient has bilateral periaortic soft tissue densities on CT scan and abdominal ultrasound consistent with either retroperitoneal fibrosis or lymphoma. The patient does have mild to moderate hydronephrosis with ureterectasis to the level of the soft tissue density with the abdominal CT scan being reviewed personally by myself. Currently the patient has no abdominal or flank pain or nausea or vomiting.  2. Acute on chronic renal insufficiency. This is likely multifactorial with the patient being started on losartan last week in addition to his amlodipine and metoprolol for difficult to control hypertension. The patient does have bilateral abdominal bruits raising the question of possible renal artery stenosis. The patient's creatinine was 1.56 on 03/17/2012 at the time of initiation of the losartan. The patient has also had two weeks of diffuse abdominal pain with anorexia and may have been dehydrated prior to admission. The possibility of intermittent bladder outlet obstruction with bladder distention was raised by appearance of the patient's bladder on CT scanning and the postvoid residual of 354 on admission, however, no significant residual urine was encountered on in and out  catheterization and subsequent bladder scans.  3. Lower urinary tract symptoms without palpable enlargement on examination of the prostate. The patient has been on terazosin 5 mg p.o. at bedtime for the past six years for nocturia with a good response with improvement in the nocturia decreasing from 3 to 4 episodes per night to 1 to 2. The patient does report though a recent increase in his urinary frequency to approximately every two hours with an increase sense of incomplete bladder emptying.   RECOMMENDATIONS:  1. Agree with holding the losartan.  2. We will place a Foley catheter to straight drainage while monitoring the patient's acute renal insufficiency.  3. If the renal insufficiency progresses despite holding the losartan and the Foley catheter decompression, consideration for bilateral ureteral stents may be entertained.  4. We will defer management of the retroperitoneal mass to oncology.  5. Long-term urology follow-up and management per Dr. Maryan Puls whom the patient has identified as his preferred urologist in the local area as Dr. Yves Dill has treated the patient in the past.   Thank you very much for the opportunity to participate in the care of this most pleasant gentleman.  ____________________________ Darcella Cheshire, MD [email or web address] D: 03/22/2012 15:05:23 ET T: 03/23/2012 14:00:17 ET JOB#: [id number]  cc: Darcella Cheshire, MD, <Dictator> Darcella Cheshire MD ELECTRONICALLY SIGNED 04/06/2012 12:58

## 2015-02-06 NOTE — Op Note (Signed)
PATIENT NAME:  Mark Sanchez, Mark Sanchez MR#:  [id number] DATE OF BIRTH:  Dec 29, 1932  DATE OF PROCEDURE:  04/04/2012  PREOPERATIVE DIAGNOSIS: Bilateral hydronephrosis.   POSTOPERATIVE DIAGNOSIS: Bilateral hydronephrosis.   PROCEDURES:  1. Cystoscopy with bilateral double pigtail stent placement.  2. Fluoroscopy.   SURGEON: Otelia Limes. Yves Dill, MD   ANESTHESIOLOGIST: Dr. Kayleen Memos   ANESTHETIC METHOD: General.   INDICATIONS: See the dictated history and physical. After informed consent, the patient requests the above procedure.   OPERATIVE SUMMARY: After adequate general anesthesia had been obtained, the patient was placed into dorsal lithotomy position and the perineum was prepped and draped in the usual fashion. A C-arm was brought into the surgical field. The 17 French cystoscope was then coupled with the camera and then visually advanced into the bladder. Bladder was thoroughly inspected. No bladder tumors were identified. Both ureteral orifices were identified and had clear efflux. At this point, a 0.035 glidewire was advanced up the right ureter under fluoroscopic guidance. Cystoscope was removed and the sheath exchanged for a 21 French sheath. A 6 x 26 cm double pigtail stent was then advanced over the guidewire and positioned in the ureter under fluoroscopic guidance. Guidewire was then removed taking care to leave the stent in position. The procedure was repeated in an identical fashion on the left side. At this point bladder was drained and cystoscope was removed. This portion of the procedure was then terminated and the patient tolerated procedure well.   ____________________________ Otelia Limes. Yves Dill, MD [id number] D: 04/04/2012 08:23:12 ET T: 04/04/2012 12:46:15 ET JOB#: [id number] Otelia Limes WOLFF MD ELECTRONICALLY SIGNED 04/07/2012 7:33

## 2015-02-06 NOTE — Consult Note (Signed)
PATIENT NAME:  Mark Sanchez, Mark Sanchez MR#:  [id number] DATE OF BIRTH:  [birth year]  DATE OF CONSULTATION:  03/25/2012  REFERRING PHYSICIAN:  Vivien Presto, MD CONSULTING PHYSICIAN:  Otelia Limes. Yves Dill, MD  REASON FOR CONSULTATION: Bilateral hydronephrosis with acute on chronic renal insufficiency.   HISTORY OF PRESENT ILLNESS: Mark Sanchez is a 81 year old Caucasian male admitted to the hospital on 03/21/2012 for further evaluation of weight loss, abdominal pain, and retroperitoneal mass with hydronephrosis. He was initially seen in consultation by Dr. Delma Officer on 03/22/2012 and he very eloquently  documented the present illness and implications. I reviewed Dr. Julianne Rice consultation as well as further laboratory studies and radiographic studies in the chart.   IMPRESSION:  1. Bilateral hydronephrosis.  2. 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  3. Acute on chronic renal insufficiency with creatinine relatively stable for the last 3 to 4 days.   RECOMMENDATIONS/PLAN: I had a 60 minute discussion with the patient, his wife and son regarding hydronephrosis in the face of a retroperitoneal mass and possible placement of stents. They are undecided whether to proceed with stents or even chemotherapy in the future. I would suggest reconsulting urology if they wish to proceed with bilateral stents.  ____________________________ Otelia Limes. Yves Dill, MD [email or web address] D: 03/25/2012 13:01:40 ET T: 03/25/2012 13:23:07 ET JOB#: [id number]  cc: Otelia Limes. Yves Dill, MD, <Dictator> Royston Cowper MD ELECTRONICALLY SIGNED 03/25/2012 14:42

## 2015-02-06 NOTE — Consult Note (Signed)
PATIENT NAME:  Mark Sanchez, Mark Sanchez MR#:  [id number] DATE OF BIRTH:  1933-06-15  DATE OF CONSULTATION:  03/26/2012  REFERRING PHYSICIAN:  Vivien Presto, MD CONSULTING PHYSICIAN:  Norva Bowe Lilian Kapur, MD  REASON FOR CONSULTATION: Acute renal failure with history of hydronephrosis.   HISTORY OF PRESENT ILLNESS: The patient is a very pleasant 81 year old Caucasian male with past medical history of hypertension, osteoarthritis, benign prostatic hypertrophy, varicose veins, gastroesophageal reflux disease, history of right bundle branch block, glaucoma, asthma, and allergic rhinitis who presented to [facility] with weight loss, abdominal pain, and abnormal CT scan. He is normally followed by Dr. Rosanna Randy as an outpatient. He reports to me that he has had a 15 pound weight loss over the past 2 to 3 weeks. He has also had some mid epigastric abdominal pain. He has also had some nausea and decreased appetite. These symptoms led to a CT scan of the abdomen and pelvis which demonstrated abnormal periaortic region below the renal arteries. This subsequently led to a PET scan which demonstrated abnormal appearance of the pancreas as well as FDG positive periaortic lymph nodes. The patient will apparently be having an endoscopic ultrasound at [facility] tomorrow. We are asked to see the patient for acute renal failure in the setting of known hydronephrosis. It appears that his baseline creatinine is 0.8 from June 2008. His creatinine now is 2.2. Upon admission, on 03/21/2012, his creatinine was 2.17. The patient has a Foley catheter in place. The patient has also been seen by Dr. Yves Dill. The patient has decided with proceeding with bilateral ureteral stents to treat his underlying obstruction. The patient has also been seen by oncology.   PAST MEDICAL HISTORY:  1. Hypertension.  2. Osteoarthritis.  3. Benign prostatic hypertrophy.  4. Varicose veins.  5. Gastroesophageal reflux  disease. 6. History of right bundle branch block.  7. Glaucoma.  8. Asthma.  9. Allergic rhinitis.  10. Retroperitoneal lymphadenopathy.  11. Abnormal appearance of pancreas on PET scan.  12. Bilateral mild to moderate hydronephrosis.  13. Recent acute renal failure.   ALLERGIES: Sulfa drugs.   CURRENT INPATIENT MEDICATIONS:  1. D5 half normal saline at 50 mL/h.  2. Tylenol 650 mg p.o. every four hours p.r.n. pain.  3. Amlodipine 5 mg daily.  4. Aspirin 81 mg daily.  5. Brimonidine 0.2% ophthalmic solution one drop to both eyes three times daily. 6. Dorzolamide one drop to both eyes three times daily. 7. Labetalol 10 mg IV every four hours p.r.n. hypertension.  8. Latanoprost 0.005% one drop to both eyes at bedtime.  9. Losartan 100 mg p.o. daily, which is currently on hold.  10. Metoprolol 12.5 mg p.o. twice a day. 11. Asmanex one puff inhaled at bedtime.  12. Zofran 4 mg IV every four hours p.r.n.  13. Pravachol 20 mg p.o. at bedtime.  14. Zantac 150 mg p.o. twice a day. 15. Hytrin 5 mg p.o. at bedtime.  16. Fluticasone one spray to both nostrils at bedtime.  17. Levofloxacin 500 mg IV x1.   SOCIAL HISTORY: The patient is married. He has a son who works as a Software engineer. The patient currently works as a Art gallery manager. The patient smokes cigars. He quit about 10 years ago. The patient drinks alcohol occasionally. No illicit drug use.   FAMILY HISTORY: The patient's sister had history of breast cancer. No reported family history of pancreatic cancer.   REVIEW OF SYSTEMS: CONSTITUTIONAL: The patient denies fevers or chills. The patient does  report malaise and weight loss. EYES: Denies diplopia currently. He has a history of glaucoma. HEENT: Denies headaches and hearing loss. Denies epistaxis or sore throat. PULMONARY: Denies cough, shortness of breath, or hemoptysis. CARDIOVASCULAR: Denies chest pain, palpitations, PND, or orthopnea. GASTROINTESTINAL: Reports nausea, abdominal pain, and  weight loss. GU: Denies frequency or urgency. Reports that he is still passing urine. NEUROLOGIC: Denies focal extremity numbness, weakness, or tingling. MUSCULOSKELETAL: Denies joint pain, swelling, or redness. ENDOCRINE: Denies polyuria, polydipsia, or polyphagia. HEMATOLOGIC/LYMPHATIC: Denies easy bruisability, bleeding, or swollen lymph nodes. Does have retroperitoneal lymphadenopathy. INTEGUMENTARY: Denies skin rashes or lesions. PSYCHIATRIC: Denies depression or bipolar disorder. ALLERGY/IMMUNOLOGIC: The patient does have history of seasonal allergies and allergic rhinitis. Denies history of immunodeficiency.   PHYSICAL EXAMINATION:   VITAL SIGNS: Temperature 97.6, pulse 78, respirations 18, blood pressure 146/64, and pulse oximetry 96%.   GENERAL: 58 Caucasian male who appears his stated age, currently in no acute distress.   HEENT: Normocephalic, atraumatic. Extraocular movements are intact. Pupils equal, round, and reactive to light. No scleral icterus. Conjunctivae are pink. No epistaxis noted. Gross hearing intact. Oral mucosa moist.   NECK: Supple and without JVD or lymphadenopathy.   LUNGS: Clear to auscultation bilaterally with normal respiratory effort.   HEART: S1 and S2 regular rate and rhythm. No murmurs, rubs, or gallops appreciated.   ABDOMEN: Soft, nontender, and nondistended. Bowel sounds positive. No rebound or guarding. No obvious hepatosplenomegaly appreciated at this time.   EXTREMITIES: No clubbing, cyanosis, or edema.   NEUROLOGIC: The patient is alert and oriented to time, person, and place. Strength is 5/5 in both upper and lower extremities.   MUSCULOSKELETAL: No joint redness, swelling, or tenderness appreciated.   SKIN: Warm and dry. No rashes noted.   GU: No suprapubic tenderness is noted at this time.   PSYCHIATRIC: The patient is with appropriate affect and appears to have good insight into his current illness.   LABORATORY, DIAGNOSTIC AND  RADIOLOGIC DATA: Sodium 139, potassium 4.1, chloride 107, CO2 24, BUN 23, creatinine 2.2, and glucose 99. CA-19-9 was 13.  PET scan showed soft tissue density in the periaortic and common iliac regions that showed evidence of F-18 FDG accumulation. There was also abnormal accumulation within the pancreatic body and tail concerning for malignancy rather than inflammation.   PSA 2.3. CBC shows WBC 7.6, hemoglobin 10.8, hematocrit 32, and platelets 149. LDH 139. LFTs: Total bilirubin 0.6, direct bilirubin 0.2, alkaline phosphatase 98, ALT 12, AST 16, total protein 7.4, and albumin 3.3.   Urinalysis shows no protein in the urine, 1 RBC per high-power field, and 1 WBC per high-power field.   Renal ultrasound shows mild to moderate bilateral hydronephrosis. There was also a small 2 cm complex cystic left renal mass.   IMPRESSION: This is a 81 year old Caucasian male with past medical history of hypertension, osteoarthritis, benign prostatic hypertrophy, varicose veins, GERD, history of right bundle branch block, glaucoma, asthma, and allergic rhinitis who has been experiencing weight loss, abdominal pain. The patient ultimately was found to have an abnormal PET scan with abnormal appearing pancreas along with periaortic lymph nodes. The patient was also found to have bilateral hydronephrosis and mild anemia.   PROBLEM LIST:  1. Acute renal failure, creatinine 2.2.  2. Bilateral hydronephrosis, likely secondary to periaortic lymphadenopathy.  3. Periaortic lymphadenopathy.  4. Abnormal PET appearance of body and tail of the pancreas.  5. Anemia, not otherwise specified.  6. 2 cm complex cystic left renal mass seen on ultrasound  but not visualized on CT scan of the abdomen and pelvis.   PLAN: The patient presents with a very interesting case. He has had some weight loss as well as abdominal pain. He has an abnormal PET scan that seems to involve the body and tail of his pancreas and also appears to have  lymphadenopathy in the periaortic region. The periaortic lymphadenopathy is likely leading to his mild to moderate hydronephrosis now. The hydronephrosis is the likely cause of his underlying acute renal failure. However, we recommend further work-up which can be performed as an outpatient including SPEP, UPEP, ANA, and urine for eosinophils. I agree with plans for endoscopic ultrasound with biopsy to determine what the etiology of the pancreatic lesion is. These findings are suspicious for malignancy, however. The patient also appears to be mildly anemic. Dr. Leia Alf is following. As above, however, we would recommend SPEP and UPEP for further work-up of the anemia. We will plan to see the patient back in the office in one week. We will be in touch with the patient and his family for this appointment.   I would like to thank Dr. Bridgette Habermann for this kind referral. Further plan as the patient progresses. ____________________________ Tama High, MD [email or web address] D: 03/26/2012 15:15:26 ET T: 03/26/2012 15:43:47 ET JOB#: [id number]  cc: Tama High, MD, <Dictator> Tama High MD ELECTRONICALLY SIGNED 04/29/2012 19:31

## 2015-02-06 NOTE — Discharge Summary (Signed)
PATIENT NAME:  Mark Sanchez, Mark Sanchez MR#:  [id number] DATE OF BIRTH:  Apr 20, 1933  DATE OF ADMISSION:  03/21/2012 DATE OF DISCHARGE:  03/26/2012  PRIMARY CARE PHYSICIAN: Dr. Rosanna Randy   CONSULTANTS:  1. Dr. Maudie Mercury and Dr. Yves Dill, urology. 2. Dr. Ma Hillock, oncology.  3. Dr. Holley Raring, nephrology.   CHIEF COMPLAINT: Referred for direct admission by her PCP for weight loss, abdominal pain, and abnormal CT scan.   DISCHARGE DIAGNOSES:  1. 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  2. Urinary retention and hydronephrosis.  3. Renal failure, likely acute.  4. Hypokalemia.   SECONDARY DIAGNOSES:  1. Hypertension.  2. Osteoarthritis.  3. Benign prostatic hypertrophy.  4. History of varicose veins.  5. Gastroesophageal reflux disease.  6. History of right bundle branch block.  7. Glaucoma.  8. Asthma.  9. Allergic rhinitis.   DISCHARGE MEDICATIONS:  1. Metoprolol 12.5 mg 2 times a day.  2. Asmanex 220 mcg inhaled aerosol powder 1 puff once a day in the evening.  3. Dorzolamide/timolol 2.23%/0.68% ophthalmic solution, one drop to each affected eye two times a day. 4. Terazosin 5 mg at bedtime.  5. Fluticasone 50 mcg nasal spray once a day at bedtime.  6. Ranitidine 150 mg 2 times a day.  7. Amlodipine 5 mg daily.  8. Bayer Aspirin 81 mg daily, hold until ureteral stenting is done by Dr. Yves Dill on Monday. Brimonidine 0.2% ophthalmic solution, one drop to each affected eye three times daily. 9. Latanoprost 0.005% ophthalmic solution, one drop to each affected eye once a day.  10. Ambien 5 mg p.o. at bedtime as needed for sleep aid.  The patient will be discharged with a Foley leg bag until seen by Dr. Yves Dill.   DIET: Low sodium, renal diet.   ACTIVITY: As tolerated.   FOLLOWUP:  1. Please follow up with Dr. Ardis Hughs tomorrow at 7:00 a.m. for your endoscopic ultrasound.  2. Please follow up with Dr. Yves Dill tomorrow at 2:15  p.m.  3. Please follow up with Dr. Ma Hillock and Dr. Rosanna Randy within 1 to 2 weeks and check a BMP within a week.  4. Please follow up with Dr. Holley Raring within 1 to 2 weeks. An appointment will be made by him for you and given to you.   DISPOSITION: Home.   HISTORY OF PRESENT ILLNESS: For full details of the history and physical, please see the dictation on 03/21/2012 by Dr. Bridgette Habermann, but briefly this is a pleasant 81 year old male with glaucoma, hypertension, and other medical conditions described above who has been having weight loss and abdominal pain. He was being worked up as an outpatient by his primary care physician. He had a CT of the chest, abdomen, and pelvis showing possible abdominal aneurysm versus adenopathy with some urinary retention and was referred to the hospital for further evaluation and management. 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 He did have mild bilateral hydronephrosis at that time. On arrival, he was found to have renal failure with creatinine above 2 and admitted to the hospitalist service.   LABORATORY, DIAGNOSTIC, AND RADIOLOGICAL DATA: Creatinine has been stable in  the low 2s.  GFR has been 28-29. Initial sodium 139, potassium 3.5, chloride 103. LFTs: Albumin 3.3, otherwise within normal limits. Initial hemoglobin 11.3, hematocrit 33.5, platelets 157, WBC 8.6. Urinalysis not suggestive of infection. PSA is 2.3, within normal limits. CA19-9 is 13. CT of the chest, abdomen, and pelvis done on 06/06,  the day prior to admission, showing abnormal appearance of the periaortic region. Aneurysm or adenopathy are differential considerations. Moderate amount of urine in the urinary bladder with fullness in both renal collecting system and ureters, suggesting mild obstructive change. Bilateral inguinal hernia repair. Granulomatosis calcification in the liver. PET scan done on 06/10 showing soft tissue density in the periaortic and common  iliac regions showing evidence of accumulation, abnormal accumulation within the pancreatic body and tail concerning for malignancy rather than inflammation, but inflammation is not completely excluded. Ultrasound of the aorta on the day of admission as well as the kidneys bilaterally showing no abdominal aortic aneurysm or aneurysmal dilation of the iliac arteries. There is hypoechoic soft tissue density seen with retroperitoneal fibrosis versus lymphadenopathy. Mild to moderate bilateral hydronephrosis, which may be resulting from this process.   HOSPITAL COURSE:  1. The patient was admitted to the hospitalist service and oncology services were consulted. The patient was seen by Dr. Ma Hillock while in the hospital. The patient was started on IV fluids and p.r.n. nausea medicines. The patient does have abdominal symptoms including weight loss with the CT scan showing possible mass, and therefore a PET scan was ordered. The PET scan did show accumulation in the periaortic region as well as pancreas and this could be seen from a malignancy. LDH, PSA, and CA19-9 have not been significantly elevated. At this point, he has an appointment with Dr. Ardis Hughs in [place] for endoscopic ultrasound and biopsy of the pancreatic lesion.  2. Urinary retention: The patient did have significant urinary retention on arrival and although he does have the ability to urinate, did need placement of Foley per recommendation from urologist. He was seen by Dr. Maudie Mercury and then followed by Dr. Yves Dill.  The hydronephrosis seems to be possibly secondary to the abdominal masses. He has an appointment with Dr. Yves Dill tomorrow and is scheduled for ureteral stenting on Monday. He did have renal failure. His creatinine has been stable around 2.2, without worsening in the last several days through hospitalization. Nephrology consult has been made and he was seen by Dr. Holley Raring. Per verbal conversation with him, he could be discharged with outpatient  followup and lab work if needed. Of note, the patient's creatinine was in the mid 1s last week per outpatient records. Here he did not have significant improvement with a Foley as well as his IV fluids. His ARB has been held.   While hospitalized his other outpatient medications were continued including his eyedrops and allergic rhinitis medications. At this point he will be discharged with outpatient followup as described above.   CODE STATUS: FULL CODE.   DISPOSITION: Home.  TOTAL TIME SPENT: 40 minutes.    ____________________________ Vivien Presto, MD sa:bjt D: 03/26/2012 14:45:43 ET T: 03/27/2012 10:38:45 ET JOB#: [id number]  cc: Vivien Presto, MD, <Dictator> Richard L. Rosanna Randy, MD Tama High, MD Sandeep R. Ma Hillock, MD Otelia Limes. Yves Dill, MD Vivien Presto MD ELECTRONICALLY SIGNED 04/04/2012 64:33

## 2015-02-06 NOTE — H&P (Signed)
PATIENT NAME:  Mark Sanchez, Mark Sanchez MR#:  [id number] DATE OF BIRTH:  08-27-1933  DATE OF ADMISSION:  03/21/2012  REFERRING PHYSICIAN: Dr. Rosanna Randy   PRIMARY CARE PHYSICIAN: Dr. Rosanna Randy   REASON FOR ADMISSION: Weight loss, abdominal pain, abnormal CAT scan.   HISTORY OF PRESENT ILLNESS: The patient is a 81 year old pleasant Caucasian male with history of glaucoma and hypertension who was referred to [facility] for direct admission by Dr. Alben Spittle office. Apparently the patient has been having weight loss of up to 13 pounds in the last month and a half with abdominal pain that is off and on, can last several hours without any alleviating or aggravating factors. He initially had a bout of what he describes as a viral gastroenteritis with nausea, vomiting, and diarrhea a month and a half ago which subsequently resolved after several days. He has had bouts of nausea more recently in the last week or so. He cannot keep it his food down depending on what he eats. He has had no fevers, chills, or night sweats but he does feel cold. He has no UTI symptoms. There is no radiation of his abdominal pain. Of note, there were CAT scans and ultrasounds done in the last two days as an outpatient. The CAT scan of the chest, abdomen, and pelvis without contrast shows possible abdominal aneurysm versus adenopathy with some urinary retention. An ultrasound of aorta and kidneys bilaterally was performed which showed possible homogeneous appearance in the area of the aorta which could be retroperitoneal fibrosis versus lymphadenopathy and mild to moderate bilateral hydronephrosis. Therefore, he was asked to be admitted for further observation and evaluation.   PAST MEDICAL HISTORY:  1. Hypertension. 2. Osteoarthritis. 3. Benign prostatic hypertrophy. 4. Varicose veins.  5. Gastroesophageal reflux disease.  6. History of right bundle branch block. 7. Glaucoma. 8. Asthma. 9. Allergic rhinitis.  MEDICATIONS: 1. Travatan  ophthalmic at bedtime 0.004% solution. 2. Dorzolamide 2% solution ophthalmic apply to eyes. 3. Brimonidine tartrate ophthalmic solution 0.2% one drop to each affected eye three times a day. 4. Amlodipine 5 mg daily.  5. Aspirin 81 mg daily.  6. Dorzolamide/timolol ophthalmic two times a day apply to eyes one drop. 7. Fluticasone 50 mcg one spray nasally at bedtime.  8. Latanoprost 0.005% ophthalmic solution one drop each affected eye once a day at bedtime.  9. Losartan 100 mg daily.  10. Metoprolol tartrate 12.5 mg b.i.d.  11. Ranitidine 150 mg 2 times a day.  12. Terazosin 5 mg at bedtime.   ALLERGIES: No known drug allergies.   PAST SURGICAL HISTORY:  1. Shoulder surgery. 2. Ankle fracture surgery. 3. Bilateral inguinal hernia repair.   SOCIAL HISTORY: No current tobacco, however, he did smoke cigar 2 to 3 times a week for a long time, quit a decade ago. Occasional alcohol. No drug use. He is a Hospital doctor, actively works currently.   REVIEW OF SYSTEMS: CONSTITUTIONAL: No fever. There is overall weakness and 13 pound weight loss. EYES: Has glaucoma. ENT: Occasional headaches which is sinus-like. No sore throat or runny nose. He has allergic rhinitis. CARDIOVASCULAR: Has a history of right bundle branch block. No chest pains. No orthopnea. No lower extremity edema. Has history of hypertension with uncontrolled blood pressure more recently. RESPIRATORY: No cough, wheezing, shortness of breath, or COPD. ABDOMEN: Has got bloating. Has off and on abdominal pain more in the lower epigastric area and below the umbilical area without radiation. No black tarry stools. Has had nausea and vomiting  episodes in the last week. No diarrhea. No constipation. No melena. GU: Has history of benign prostatic hypertrophy. Denies dysuria or hematuria. MUSCULOSKELETAL: Has chronic arthritis. NEUROLOGIC: Denies focal weakness or numbness. No history of stroke or TIA. PSYCH: No anxiety or insomnia. HEME/LYMPH:  Denies anemia or easy bruising.   PHYSICAL EXAMINATION:   VITAL SIGNS: Temperature 99, respiratory rate 20, blood pressure 159/70, pulse 80, oxygen sat 97% on room air.   GENERAL: The patient is a very pleasant Caucasian male sitting in bed in no obvious distress talking in full sentences.   HEENT: Normocephalic, atraumatic. Pupils are equal and reactive. Dry mucous membranes. Anicteric sclerae.   NECK: Supple. No thyroid tenderness. No cervical lymphadenopathy.   CARDIOVASCULAR: S1, S2 regular rhythm and rate. No murmurs, rubs, or gallops.   LUNGS: Clear to auscultation without wheezing, rhonchi, or rales.   ABDOMEN: Soft, nontender, nondistended. No rebound or guarding. No organomegaly appreciated.   EXTREMITIES: No significant lower extremity edema.   NEUROLOGICAL: Cranial nerves II through XII grossly intact. Strength 5/5 in all extremities.   PSYCH: Awake, alert, oriented x3, pleasant, conversant, cooperative.  SKIN: Warm. No cyanosis or clubbing.   LABORATORY, DIAGNOSTIC, AND RADIOLOGICAL DATA: No recent labs but CT of chest, abdomen, and pelvis without contrast from yesterday shows abnormal appearance of the periaortic region. Aneurysm or adenopathy are differential considerations. Moderate amount of urine in the urinary bladder with fullness in both renal collecting systems and ureter suggesting of mild obstructive change or possible urinary retention. Bilateral inguinal hernia repair. Granulomatous calcification in the liver. Mildly thickened loops of small bowel, correlate for signs of enteritis. Ultrasound of the aorta and kidneys bilaterally showing no sonographic evidence of abdominal aortic aneurysm or aneurysmal dilation of the iliac arteries. There is hypoechoic soft tissue oriented in a longitudinal fashion surrounding the aorta down to the level of iliac bifurcation with relatively homogeneous appearance, lymphadenopathy versus retroperitoneal fibrosis. Also, mild to  moderate bilateral hydronephrosis which may be resulting from this process.   ASSESSMENT AND PLAN: We have a 81 year old pleasant Caucasian male with abdominal complaints of ongoing nausea, vomiting, off and on abdominal pain with CT evidence of possible lymphadenopathy versus peritoneal fibrosis. He will be directly admitted per primary care physician's request for further follow-up. We would start the patient on gentle IV fluids and Zofran p.r.n. for his nausea and get Oncology consult. I have discussed the case with Dr. Ma Hillock who will see the patient. The patient has had no fevers, chills, night sweats. There are no respiratory or cardiac symptoms. As there are no PET scans over the weekend, one will be ordered for Monday for further work-up. Also, check an LDH in the morning. Get CBC and complete metabolic panel work-up.   In regards to high blood pressure, will continue outpatient medications and add labetalol IV p.r.n.   I would resume his glaucoma medications.   His asthma appears to be stable without any wheezing.   The patient does appear to have some urinary retention currently with a positive bladder scan showing greater than 300 mL postvoid. Would do an in and out cath and if persistent consider a Foley and Urology consult. This likely is compressive effects from the above lymphadenopathy. Would resume the patient's other medications including aspirin, Terazosin, and statin.   CODE STATUS: The patient is FULL CODE.   TOTAL TIME SPENT: 60 minutes.   ____________________________ Vivien Presto, MD sa:drc D: 03/21/2012 16:07:43 ET T: 03/21/2012 16:31:20 ET JOB#: [id number]  cc: [id number]  Bridgette Habermann, MD, <Dictator> Richard L. Rosanna Randy, MD Vivien Presto MD ELECTRONICALLY SIGNED 04/04/2012 20:07

## 2015-02-06 NOTE — Consult Note (Signed)
ONCOLOGY followup note - feels better overall. No N/V/abd painno fevers. No diarrhea.NAD, afebrile          lungs - b/l good air entry          abd - soft, NT          ext - no edema Impression/Recommendations: 81 year old gentleman with CT scan of the abdomen and pelvis shows abnormalities suggestive of retroperitoneal adenopathy versus other etiology like fibrosis or aneurysm. PET scan shows abnormal uptake in retroperitoneal nodes, alongwith PET-positive mass in tail of pancreas. PSA is normal, CA 19-9 pending. Above radiological findings raise possibility of pancreatic malignancy. Per d/w radiology Dr.Cooper they recommend EUS and bx, if patient gets discharged by tomorrow, will try to get appt with Dr.Daniel Ardis Hughs on 6/13. Will follow-up after this is done. The patient and wife were explained above, agreeable to this plan.    Electronic Signatures: Jonn Shingles (MD)  (Signed on 11-Jun-13 23:53)  Authored  Last Updated: 11-Jun-13 23:53 by Jonn Shingles (MD)

## 2015-02-06 NOTE — Consult Note (Signed)
Chief Complaint:   Subjective/Chief Complaint Hosp Day #2, Day #2 Foley Pt without new complaints. Continues to feel better subjectively. No abd/flank pain or nausea. Appetite improving - tolerating regular diet well. Had a nL formed BM this AM ("like a [employer]"). Tolerating foley well.   VITAL SIGNS/ANCILLARY NOTES: **Vital Signs.:   09-Jun-13 04:57   Vital Signs Type Routine   Temperature Temperature (F) 98.1   Celsius 36.7   Temperature Source oral   Pulse Pulse 68   Pulse source per vital sign device   Respirations Respirations 20   Systolic BP Systolic BP 030   Diastolic BP (mmHg) Diastolic BP (mmHg) 81   Mean BP 104   BP Source vital sign device   Pulse Ox % Pulse Ox % 96   Pulse Ox Activity Level  At rest   Oxygen Delivery Room Air/ 21 %  *Intake and Output.:   09-Jun-13 04:56   Grand Totals Intake:   Output:  550    Net:  -550 24 Hr.:  -670   Urine ml     Out:  550   Urinary Method  Foley    06:35   Grand Totals Intake:   Output:      Net:   24 Hr.:  -670   Weight Type daily   Weight Method Bed   Current Weight (lbs) (lbs) 141.7   Current Weight (kg) (kg) 64.2   Height (ft) (feet) 5   Height (in) (in) 11   Height (cm) centimeters 180.3   BSA (m2) 1.8   BMI (kg/m2) 19.7    Shift 07:00   Grand Totals Intake:   Output:  550    Net:  -550 24 Hr.:  -670   Urine ml     Out:  550   Length of Stay Totals Intake:  980 Output:  2155    Net:  -1175    Daily 07:00   Grand Totals Intake:  480 Output:  1150    Net:  -670 24 Hr.:  -670   Oral Intake      In:  480   Urine ml     Out:  1150   Length of Stay Totals Intake:  980 Output:  2155    Net:  -0923    08:15   Grand Totals Intake:  240 Output:      Net:  300 76 Hr.:  240   Oral Intake      In:  240   Percentage of Meal Eaten  100    Shift 15:00   Grand Totals Intake:  240 Output:      Net:  226 33 Hr.:  240   Oral Intake      In:  240   Length of Stay Totals Intake:  1220 Output:   2155    Net:  -354   Brief Assessment:   Respiratory normal resp effort    Gastrointestinal Normal    Gastrointestinal details normal Soft  Nontender  Nondistended   Lab Results: Hepatic:  07-Jun-13 17:31    Bilirubin, Total 0.6   Bilirubin, Direct 0.2 (Result(s) reported on 21 Mar 2012 at 06:12PM.)   Alkaline Phosphatase 98   SGPT (ALT) 12 (12-78 NOTE: NEW REFERENCE RANGE 09/07/2011)   SGOT (AST) 16   Total Protein, Serum 7.4   Albumin, Serum  3.3  Routine Chem:  07-Jun-13 17:31    Glucose, Serum  141   BUN 18   Creatinine (comp)  2.17   Sodium, Serum 139   Potassium, Serum 3.5   Chloride, Serum 103   CO2, Serum 27   Calcium (Total), Serum 8.6   Anion Gap 9   Osmolality (calc) 282   eGFR (African American)  33   eGFR (Non-African American)  29 (eGFR values <32mL/min/1.73 m2 may be an indication of chronic kidney disease (CKD). Calculated eGFR is useful in patients with stable renal function. The eGFR calculation will not be reliable in acutely ill patients when serum creatinine is changing rapidly. It is not useful in  patients on dialysis. The eGFR calculation may not be applicable to patients at the low and high extremes of body sizes, pregnant women, and vegetarians.)  08-Jun-13 04:55    Glucose, Serum 94   BUN  19   Creatinine (comp)  2.19   Sodium, Serum 140   Potassium, Serum 3.6   Chloride, Serum 106   CO2, Serum 25   Calcium (Total), Serum 8.5   Anion Gap 9   Osmolality (calc) 281   eGFR (African American)  33   eGFR (Non-African American)  28 (eGFR values <13mL/min/1.73 m2 may be an indication of chronic kidney disease (CKD). Calculated eGFR is useful in patients with stable renal function. The eGFR calculation will not be reliable in acutely ill patients when serum creatinine is changing rapidly. It is not useful in  patients on dialysis. The eGFR calculation may not be applicable to patients at the low and high extremes of body sizes,  pregnant women, and vegetarians.)   LDH, Serum 139 (Result(s) reported on 22 Mar 2012 at 07:13AM.)  09-Jun-13 05:56    Glucose, Serum 95   BUN  20   Creatinine (comp)  2.17   Sodium, Serum 143   Potassium, Serum  3.4   Chloride, Serum  109   CO2, Serum 23   Calcium (Total), Serum  8.3   Anion Gap 11   Osmolality (calc) 287   eGFR (African American)  33   eGFR (Non-African American)  29 (eGFR values <62mL/min/1.73 m2 may be an indication of chronic kidney disease (CKD). Calculated eGFR is useful in patients with stable renal function. The eGFR calculation will not be reliable in acutely ill patients when serum creatinine is changing rapidly. It is not useful in  patients on dialysis. The eGFR calculation may not be applicable to patients at the low and high extremes of body sizes, pregnant women, and vegetarians.)  Routine Hem:  07-Jun-13 17:31    WBC (CBC) 8.6   RBC (CBC)  3.74   Hemoglobin (CBC)  11.3   Hematocrit (CBC)  33.5   Platelet Count (CBC) 157   MCV 90   MCH 30.3   MCHC 33.8   RDW 13.2   Neutrophil % 75.6   Lymphocyte % 14.4   Monocyte % 8.2   Eosinophil % 1.4   Basophil % 0.4   Neutrophil # 6.5   Lymphocyte # 1.2   Monocyte # 0.7   Eosinophil # 0.1   Basophil # 0.0 (Result(s) reported on 21 Mar 2012 at 05:54PM.)  08-Jun-13 04:55    WBC (CBC) 7.6   RBC (CBC)  3.60   Hemoglobin (CBC)  10.8   Hematocrit (CBC)  32.6   Platelet Count (CBC)  149   MCV 90   MCH 30.0   MCHC 33.2   RDW 13.4   Neutrophil % 61.8   Lymphocyte % 23.5   Monocyte % 11.4   Eosinophil %  2.5   Basophil % 0.8   Neutrophil # 4.7   Lymphocyte # 1.8   Monocyte # 0.9   Eosinophil # 0.2   Basophil # 0.1 (Result(s) reported on 22 Mar 2012 at 06:59AM.)   Assessment/Plan:  Invasive Device Daily Assessment of Necessity:   Does the patient currently have any of the following indwelling devices? foley    Indwelling Urinary Catheter continued, requirement due to    Reason to  continue Indwelling Urinary Catheter for strict Intake/Output monitoring for hemodynamic instability  inability to void as documented by bladder scanning   Assessment/Plan:   Assessment 1. 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 Acute on CRI -stable Cr off Losartan and with foley in place with good urine output  3. LUTS (nL-sized prostate on DRE) with recent increase in freq/sense of incomplete emptying -see #2    Plan 1. Remains without indication for acute urologic intervention. -may require stenting if renal function worsens or if improvement in the retroperitoneal process is not anticipated over the next several weeks or if treatment of the retroperitoneal process requires maximization of renal function  2. Evaluation of the retroperitoneal mass ongoing per Oncology.  3. Pt requests Dr. Maryan Puls (was treated for prostatitis by Dr. Yves Dill in the past) for long-term Urologic care/management.   Electronic Signatures: Darcella Cheshire (MD)  (Signed 09-Jun-13 10:41)  Authored: Chief Complaint, VITAL SIGNS/ANCILLARY NOTES, Brief Assessment, Lab Results, Assessment/Plan   Last Updated: 09-Jun-13 10:41 by Darcella Cheshire (MD)

## 2015-02-06 NOTE — Consult Note (Signed)
PATIENT NAME:  Mark Sanchez, LLAMAS MR#:  [id number] DATE OF BIRTH:  11-May-1933  DATE OF CONSULTATION:  03/22/2012  REFERRING PHYSICIAN:  Vivien Presto, MD CONSULTING PHYSICIAN:  Preston Garabedian R. Ma Hillock, MD  REASON FOR CONSULTATION: Abdominal lymphadenopathy and weight loss.   HISTORY OF PRESENT ILLNESS: The patient is a 81 year old gentleman with past medical history significant for hypertension and glaucoma who has been having 13 pound unintentional weight loss in the last 1-1/2 months along with persistent abdominal pain, accompanied by intermittent nausea and now admitted with dehydration. The patient states that he had a stomach virus about two months ago when he had an episode of nausea, vomiting, and diarrhea, although symptoms  improved but currently he is not eating well and is losing weight. He has also noticed some low back pain. CT scan of the abdomen/pelvis is suggestive of retroperitoneal lymphadenopathy versus retroperitoneal fibrosis. The patient denies any known history of malignancy in the past, denies any cough, chest pain, or hemoptysis. Otherwise he remains physically active and ambulatory.   PAST MEDICAL HISTORY/PAST SURGICAL HISTORY:  1. Hypertension. 2. Glaucoma.  3. Osteoarthritis.  4. Benign prostate hypertrophy.  5. Varicose veins.  6. Gastroesophageal reflux disease.  7. History of right bundle branch block.  8. Asthma. 9. Allergic rhinitis.   FAMILY HISTORY: Two sisters had breast cancer, one in her late 41s and another in her 3s. Denies history of colon or prostate cancer.   SOCIAL HISTORY: Denies smoking, but did smoke a cigar two times a week for long time, quit a decade ago. Occasional alcohol intake with it. Denies recreational drug usage. Continues to work actively.   ALLERGIES: No known drug allergies.   HOME MEDICATIONS:  1. Amlodipine 5 mg daily. 2. Aspirin 81 mg daily. 3. Fluticasone 50 mcg one spray nasally at bedtime.  4. Dorzolamide/timolol eyedrops  twice daily to each eye, one drop. 5. Travatan 0.004% eye drops at bedtime.  6. Latanoprost 0.005% ophthalmic solution one drop to each eye at bedtime. 7. Losartan 100 mg daily. 8. Metoprolol tartrate 12.5 mg twice a day. 9. Zantac 150 mg twice a day. 10. Terazosin 5 mg at bedtime.   REVIEW OF SYSTEMS: CONSTITUTIONAL: As in history of present illness. No fevers or night sweats. No chills. HEENT: No headaches or dizziness. No epistaxis, ear or jaw pain. CARDIAC: No angina, palpitations, orthopnea, or paroxysmal nocturnal dyspnea. LUNGS: No new dyspnea on exertion, cough, chest pain, or hemoptysis. GASTROINTESTINAL: As in history of present illness. No bright blood in stools or melena. GENITOURINARY: No dysuria or hematuria. HEMATOLOGIC: Denies any obvious bleeding symptoms. SKIN: No new rashes or pruritus. MUSCULOSKELETAL: Has back pain. No other new bone pains. EXTREMITIES: No new swelling or pain. NEUROLOGIC: No new focal weakness, loss of consciousness, or seizures. ENDOCRINE: No polyuria or polydipsia. Has unintentional weight loss, borderline appetite.   PHYSICAL EXAMINATION:   GENERAL: The patient is a moderately built and thin individual, alert and oriented and converses appropriately, no acute distress. No icterus or pallor.   VITAL SIGNS: 99, 81, 20, 141/58, 95% on room air.   HEENT: Normocephalic, atraumatic. Extraocular movements intact. Sclera anicteric.   NECK: Supple without lymphadenopathy.   CARDIOVASCULAR: S1 and S2, regular rate and rhythm.   LUNGS: Bilateral good air entry, no crepitations or rhonchi noted.   ABDOMEN: Soft and nontender. No hepatosplenomegaly or masses palpable clinically.   LYMPHATICS: No adenopathy in the axillary or inguinal areas.   EXTREMITIES: No major edema or cyanosis.   SKIN: No  generalized rashes or major bruising.   NEUROLOGIC: No focal weakness, cranial nerves are intact.   MUSCULOSKELETAL: No obvious joint deformity or swelling.    LAB RESULTS: Hemoglobin 10.8, WBC 7600, normal differential, platelets 149. Creatinine 2.19, potassium 3.6, calcium 8.5, glucose 94. LDH normal at 139.   IMPRESSION AND RECOMMENDATION: A 81 year old gentleman with past medical history as described above admitted with intermittent nausea, unintentional weight loss, and borderline poor appetite. Noncontrast CT scan of the abdomen and pelvis shows abnormalities suggestive of retroperitoneal adenopathy versus other etiology like fibrosis or aneurysm. The prostate is mildly enlarged with some punctate calcifications, no other findings to suggest primary malignancy. The patient has elevated creatinine and CT scan was done without contrast. Given above symptoms and CT scan findings, I have explained to the patient that we need to get further evaluation to rule out underlying malignancy like lymphoma. We will therefore obtain PET scan evaluation on Monday, 03/24/2012. We will also check serum PSA. Otherwise clinically he is improving, recommend continuing supportive care and IV hydration, he is eating better also. We will follow-up after PET scan is done.   The patient and wife were explained above, agreeable to this plan.   Thank you for the referral. Please feel to contact me if any additional questions. ____________________________ Rhett Bannister Ma Hillock, MD srp:slb D: 03/22/2012 22:55:59 ET    T: 03/23/2012 08:21:33 ET        JOB#: [id number] cc: Trevor Iha R. Ma Hillock, MD, <Dictator> Alveta Heimlich MD ELECTRONICALLY SIGNED 03/26/2012 16:18

## 2015-02-06 NOTE — Consult Note (Signed)
Brief Consult Note: Diagnosis: Bilateral hydronephrosis. Retroperitoneal mass.   Patient was seen by consultant.   Consult note dictated.   Recommend further assessment or treatment.   Discussed with Attending MD.   Comments: Patient and family are not sure that they want stents or chemotherapy. Reconsult if they want to proceed.  Electronic Signatures: Royston Cowper (MD)  (Signed 11-Jun-13 12:58)  Authored: Brief Consult Note   Last Updated: 11-Jun-13 12:58 by Royston Cowper (MD)

## 2015-02-06 NOTE — Consult Note (Signed)
Brief Urology Consultation Report: for Consultation: b/L Hydro with Acute on Chronic Renal Insufficiency MD: Darcella Cheshire, M.D.Urologist: Maryan Puls, M.D. (treated pt for prostatitis in the past - pt requests Dr. Yves Dill for long-term management) MD: Vivien Presto, M.D.Dr. Rosanna Randy  b/L Hydronephrosis secondary to retroperitoneal mass of uncertain etiologyperi-aortic soft tissue tissue density on CT and Abd [place] (?RPF vs Lymphoma)hydro with ureterectasis to the level of the ST densitycurrent abd/flank pain Acute on Chronic Renal Insufficiency - likely multifactorialwas added to amlodipine and metoprolol last week for difficult to control HTN x 17months -b/L abd bruits on exam; Cr 1.56 on 6/3/2013due to 2 weeks of diffuse abd pain with anorexiadistention on CT and 341mL on Bladder Scan, however, with only 17mL obtained w/I&O cath -pt endorses an increased sense on incomplete bladder emptying with urinary freq q2hrs on terazosin 5mg  po qHS x 6 yrs LUTS (nL-sized prostate on DRE)increase in urinary freq with sense of incomplete bladder emptying (see #2)terazosin 5mg  po qHS x 6 yrs for nocturia with good response (decreased to 1-2 from 3-4/night)  Agree with holding LosartanPlace foley catheter to straight drain while monitoring acute renal insufficiencyIf the renal insufficiency progresses despite holding Losartan and foley catheter decompression, may need to consider b/L ureteral stentsLong-term urology follow up with Dr. Maryan Puls, who will assume care on Monday, March 24, 2012 you for the opportunity to participate in the care of this most pleasant gentleman.  Electronic Signatures: Darcella Cheshire (MD)  (Signed on 08-Jun-13 14:36)  Authored  Last Updated: 08-Jun-13 14:36 by Darcella Cheshire (MD)

## 2015-08-04 DIAGNOSIS — H409 Unspecified glaucoma: Secondary | ICD-10-CM | POA: Insufficient documentation

## 2015-08-04 DIAGNOSIS — J45909 Unspecified asthma, uncomplicated: Secondary | ICD-10-CM | POA: Insufficient documentation

## 2015-08-04 DIAGNOSIS — K219 Gastro-esophageal reflux disease without esophagitis: Secondary | ICD-10-CM | POA: Insufficient documentation

## 2015-08-04 DIAGNOSIS — M1990 Unspecified osteoarthritis, unspecified site: Secondary | ICD-10-CM | POA: Insufficient documentation

## 2015-08-04 DIAGNOSIS — J3089 Other allergic rhinitis: Secondary | ICD-10-CM | POA: Insufficient documentation

## 2015-08-04 DIAGNOSIS — N400 Enlarged prostate without lower urinary tract symptoms: Secondary | ICD-10-CM | POA: Insufficient documentation

## 2015-08-04 DIAGNOSIS — I8390 Asymptomatic varicose veins of unspecified lower extremity: Secondary | ICD-10-CM | POA: Insufficient documentation

## 2015-08-04 DIAGNOSIS — I10 Essential (primary) hypertension: Secondary | ICD-10-CM | POA: Insufficient documentation

## 2015-08-04 DIAGNOSIS — E785 Hyperlipidemia, unspecified: Secondary | ICD-10-CM | POA: Insufficient documentation

## 2015-08-04 DIAGNOSIS — F09 Unspecified mental disorder due to known physiological condition: Secondary | ICD-10-CM | POA: Insufficient documentation

## 2015-08-04 DIAGNOSIS — J449 Chronic obstructive pulmonary disease, unspecified: Secondary | ICD-10-CM | POA: Insufficient documentation

## 2015-08-04 DIAGNOSIS — J309 Allergic rhinitis, unspecified: Secondary | ICD-10-CM | POA: Insufficient documentation

## 2015-08-16 ENCOUNTER — Ambulatory Visit (INDEPENDENT_AMBULATORY_CARE_PROVIDER_SITE_OTHER): Payer: PPO | Admitting: Family Medicine

## 2015-08-16 ENCOUNTER — Encounter: Payer: Self-pay | Admitting: Family Medicine

## 2015-08-16 DIAGNOSIS — I471 Supraventricular tachycardia: Secondary | ICD-10-CM | POA: Insufficient documentation

## 2015-08-16 DIAGNOSIS — Z0000 Encounter for general adult medical examination without abnormal findings: Secondary | ICD-10-CM

## 2015-08-16 DIAGNOSIS — N189 Chronic kidney disease, unspecified: Secondary | ICD-10-CM | POA: Insufficient documentation

## 2015-08-16 DIAGNOSIS — Z1211 Encounter for screening for malignant neoplasm of colon: Secondary | ICD-10-CM

## 2015-08-16 LAB — IFOBT (OCCULT BLOOD): IMMUNOLOGICAL FECAL OCCULT BLOOD TEST: NEGATIVE

## 2015-08-16 MED ORDER — FLUTICASONE PROPIONATE 50 MCG/ACT NA SUSP: 2.0000 | Freq: Every day | NASAL | Status: DC

## 2015-08-16 MED ORDER — HYDROCODONE-ACETAMINOPHEN 10-325 MG PO TABS
1.0000 | ORAL_TABLET | Freq: Four times a day (QID) | ORAL | Status: DC | PRN
Start: 2015-08-16 — End: 2016-11-09

## 2015-08-16 NOTE — Progress Notes (Signed)
Patient ID: Minerva Ends Sr., male   DOB: 1933-02-01, 81 y.o.   MRN: [id number] Patient: Chidi Shirer., Male    DOB: 1933-04-04, 81 y.o.   MRN: [id number] Visit Date: 08/16/2015  Today's Provider: Wilhemena Durie, MD   Chief Complaint  Patient presents with  . Annual Exam   Subjective:   JERMARCUS MCFADYEN Sr. is a 81 y.o. male who presents today for his Subsequent Annual Wellness Visit. He feels well. He reports exercising as much as he can. He reports he is sleeping well. Pt reports that he had his flu vaccine about 3 weeks ago at the [place].  Colonoscopy- 01/05/02 EKG- 05/04/14 Td-01/24/04  Review of Systems  Constitutional: Negative.   HENT: Positive for hearing loss and tinnitus.   Eyes: Positive for discharge.  Respiratory: Negative.   Cardiovascular: Negative.   Gastrointestinal: Negative.   Endocrine: Negative.   Genitourinary: Negative.   Musculoskeletal: Negative.   Skin: Negative.   Allergic/Immunologic: Negative.   Neurological: Negative.   Hematological: Negative.   Psychiatric/Behavioral: Negative.     Patient Active Problem List   Diagnosis Date Noted  . Chronic kidney disease 08/16/2015  . Supraventricular tachycardia ([place]) 08/16/2015  . Allergic rhinitis 08/04/2015  . Airway hyperreactivity 08/04/2015  . Benign fibroma of prostate 08/04/2015  . CAFL (chronic airflow limitation) ([place]) 08/04/2015  . Essential (primary) hypertension 08/04/2015  . Acid reflux 08/04/2015  . Glaucoma 08/04/2015  . HLD (hyperlipidemia) 08/04/2015  . Mild cognitive disorder 08/04/2015  . Arthritis, degenerative 08/04/2015  . Asymptomatic varicose veins 08/04/2015  . TI (tricuspid incompetence) 11/21/2014  . Paroxysmal atrial fibrillation ([place]) 11/19/2014  . Esophagitis, reflux 11/08/2014  . MI (mitral incompetence) 11/08/2014  . Nonspecific (abnormal) findings on radiological and other examination of gastrointestinal tract 03/27/2012    Social History   Social  History  . Marital Status: Married    Spouse Name: N/A  . Number of Children: N/A  . Years of Education: N/A   Occupational History  . Not on file.   Social History Main Topics  . Smoking status: Former Smoker    Types: Cigars  . Smokeless tobacco: Not on file     Comment: quit about 15 years ago  . Alcohol Use: Yes     Comment: occasionally  . Drug Use: No  . Sexual Activity: Not on file   Other Topics Concern  . Not on file   Social History Narrative    Past Surgical History  Procedure Laterality Date  . Hernia repair    . Orif ankle fracture    . Eus  03/27/2012    Procedure: UPPER ENDOSCOPIC ULTRASOUND (EUS) LINEAR;  Surgeon: Milus Banister, MD;  Location: [facility];  Service: Endoscopy;  Laterality: N/A;  [place] EUS  . Fine needle aspiration  03/27/2012    Procedure: FINE NEEDLE ASPIRATION (FNA) LINEAR;  Surgeon: Milus Banister, MD;  Location: [facility];  Service: Endoscopy;  Laterality: N/A;    His family history includes Arthritis in his mother; Breast cancer in his sister and sister; Heart disease in his mother; Hypertension in his mother.    Outpatient Prescriptions Prior to Visit  Medication Sig Dispense Refill  . albuterol (PROAIR HFA) 108 (90 BASE) MCG/ACT inhaler Inhale into the lungs.    Marland Kitchen aspirin 81 MG tablet Take 81 mg by mouth daily.    . brimonidine (ALPHAGAN) 0.2 % ophthalmic solution 1 drop 3 (three) times daily.    . dorzolamide (TRUSOPT) 2 %  ophthalmic solution 1 drop 3 (three) times daily.    . fluticasone (FLONASE) 50 MCG/ACT nasal spray Place 2 sprays into the nose daily.    Marland Kitchen latanoprost (XALATAN) 0.005 % ophthalmic solution 1 drop at bedtime.    . metoprolol tartrate (LOPRESSOR) 25 MG tablet Take 25 mg by mouth 2 (two) times daily.    . mometasone (ASMANEX) 220 MCG/INH inhaler Inhale 2 puffs into the lungs daily.    Marland Kitchen omeprazole (PRILOSEC) 40 MG capsule Take by mouth.    . pravastatin (PRAVACHOL) 20 MG tablet Take by mouth.    .  terazosin (HYTRIN) 5 MG capsule Take 5 mg by mouth at bedtime.    . Travoprost, BAK Free, (TRAVATAN Z) 0.004 % SOLN ophthalmic solution Apply to eye.    Marland Kitchen amLODipine (NORVASC) 5 MG tablet Take 5 mg by mouth daily.    Marland Kitchen HYDROcodone-acetaminophen (NORCO) 10-325 MG tablet Take by mouth.    . promethazine (PHENERGAN) 25 MG tablet Take by mouth.    . ranitidine (ZANTAC) 150 MG tablet Take 150 mg by mouth 2 (two) times daily.    Marland Kitchen zolpidem (AMBIEN) 5 MG tablet Take 5 mg by mouth at bedtime as needed.     No facility-administered medications prior to visit.    Allergies  Allergen Reactions  . Iodinated Diagnostic Agents Other (See Comments)  . Sulfa Antibiotics Itching and Rash    Allergic reaction was over 30years ago. Patient unsure of exact reaction    Patient Care Team: Jerrol Banana., MD as PCP - General (Unknown Physician Specialty)  Objective:   Vitals: There were no vitals filed for this visit.  Physical Exam  Constitutional: He is oriented to person, place, and time. He appears well-developed and well-nourished.  HENT:  Head: Normocephalic and atraumatic.  Right Ear: External ear normal.  Left Ear: External ear normal.  Nose: Nose normal.  Mouth/Throat: Oropharynx is clear and moist.  Eyes: Conjunctivae and EOM are normal. Pupils are equal, round, and reactive to light.  Neck: Normal range of motion. Neck supple.  Cardiovascular: Normal rate, regular rhythm, normal heart sounds and intact distal pulses.   Pulmonary/Chest: Effort normal and breath sounds normal.  Abdominal: Soft. Bowel sounds are normal.  Genitourinary: Rectum normal, prostate normal and penis normal. Guaiac negative stool.  Musculoskeletal:  Chronic swelling/stiffness  of the medial left ankle.  Neurological: He is alert and oriented to person, place, and time. He has normal reflexes.  Skin: Skin is warm and dry.  Psychiatric: He has a normal mood and affect. His behavior is normal. Judgment and  thought content normal.    Activities of Daily Living In your present state of health, do you have any difficulty performing the following activities: 08/16/2015 08/16/2015  Hearing? Y N  Vision? N N  Difficulty concentrating or making decisions? N N  Walking or climbing stairs? N N  Dressing or bathing? N N  Doing errands, shopping? N N    Fall Risk Assessment Fall Risk  08/16/2015  Falls in the past year? No     Depression Screen PHQ 2/9 Scores 08/16/2015 08/16/2015  PHQ - 2 Score 0 0    Cognitive Testing - 6-CIT    Year: 0 points  Month: 0 points  Memorize "Pia Mau, [address], [place]"  Time (within 1 hour:) 0 points  Count backwards from 20: 0  points  Name months of year: 0  points  Repeat Address: 6 points   Total Score:  6/28  Interpretation : Normal (0-7) Abnormal (8-28)    Assessment & Plan:     Annual Wellness Visit  Reviewed patient's Family Medical History Reviewed and updated list of patient's medical providers Assessment of cognitive impairment was done Assessed patient's functional ability Established a written schedule for health screening [place] Completed and Reviewed  Exercise Activities and Dietary recommendations Goals    None      Immunization History  Administered Date(s) Administered  . Td 01/24/2004    Health Maintenance  Topic Date Due  . ZOSTAVAX  04/15/1995  . PNA vac Low Risk Adult (1 of 2 - PCV13) 04/14/2000  . TETANUS/TDAP  01/23/2014  . INFLUENZA VACCINE  08/15/2016 (Originally 05/16/2015)   Needs Tdap--he will f[facility]u at [facility].   Discussed health benefits of physical activity, and encouraged him to engage in regular exercise appropriate for his age and condition.   OA/Chronic Pain Refill Norco. AR Refill Flonase. Retroperitoneal Fibrosis I have done the exam and reviewed the above chart and it is accurate to the best of my knowledge.   Miguel Aschoff MD [facility] 08/16/2015 8:57 AM  ------------------------------------------------------------------------------------------------------------

## 2015-12-05 DIAGNOSIS — X32XXXA Exposure to sunlight, initial encounter: Secondary | ICD-10-CM | POA: Diagnosis not present

## 2015-12-05 DIAGNOSIS — L570 Actinic keratosis: Secondary | ICD-10-CM | POA: Diagnosis not present

## 2015-12-05 DIAGNOSIS — L821 Other seborrheic keratosis: Secondary | ICD-10-CM | POA: Diagnosis not present

## 2015-12-09 DIAGNOSIS — E782 Mixed hyperlipidemia: Secondary | ICD-10-CM | POA: Diagnosis not present

## 2015-12-09 DIAGNOSIS — I480 Paroxysmal atrial fibrillation: Secondary | ICD-10-CM | POA: Diagnosis not present

## 2015-12-09 DIAGNOSIS — I351 Nonrheumatic aortic (valve) insufficiency: Secondary | ICD-10-CM | POA: Diagnosis not present

## 2015-12-09 DIAGNOSIS — I10 Essential (primary) hypertension: Secondary | ICD-10-CM | POA: Diagnosis not present

## 2016-01-18 ENCOUNTER — Encounter: Payer: Self-pay | Admitting: Family Medicine

## 2016-02-13 ENCOUNTER — Ambulatory Visit (INDEPENDENT_AMBULATORY_CARE_PROVIDER_SITE_OTHER): Payer: PPO | Admitting: Family Medicine

## 2016-02-13 VITALS — BP 126/50 | HR 60 | Temp 97.7°F | Resp 16 | Wt 160.0 lb

## 2016-02-13 DIAGNOSIS — I10 Essential (primary) hypertension: Secondary | ICD-10-CM

## 2016-02-13 DIAGNOSIS — E785 Hyperlipidemia, unspecified: Secondary | ICD-10-CM

## 2016-02-13 DIAGNOSIS — K2100 Gastro-esophageal reflux disease with esophagitis, without bleeding: Secondary | ICD-10-CM

## 2016-02-13 NOTE — Progress Notes (Signed)
Patient ID: Mark Ends Sr., male   DOB: Oct 24, 1932, 82 y.o.   MRN: [id number]   [name]  MRN: [id number] DOB: [birth year]  Subjective:  HPI   1. Essential (primary) hypertension The patient is an 82 year old male who presents for follow up of his blood pressure.  His last visit was on 08/16/15 and his blood pressure at that time was 124/60.  He checks his blood pressure outside of the office ans states it usually runs good.  He states he is very sensitive and can usually tell when his pressure goes up by the way he feels.  He also notes that on a rare occasion he gets a low reading.  He states that he finds this when he feels tired and sluggish.  2. HLD (hyperlipidemia) The patient was due to have his lipids checked today.  However he had them checked in October at the [place] and the readings were as follows: Trig-72, Chol-164, LDL-102, HDL-48.  3. Esophagitis, reflux Patient states that his acid reflux is well controlled on his present medicine.  He states that he as indigestion of occasion and takes a Rolaid for this and gets good results.  He said he does not have any heartburn or acid that comes up in his throat.  The patient is due to have other labs today.   Patient Active Problem List   Diagnosis Date Noted  . Chronic kidney disease 08/16/2015  . Supraventricular tachycardia ([place]) 08/16/2015  . Allergic rhinitis 08/04/2015  . Airway hyperreactivity 08/04/2015  . Benign fibroma of prostate 08/04/2015  . CAFL (chronic airflow limitation) ([place]) 08/04/2015  . Essential (primary) hypertension 08/04/2015  . Acid reflux 08/04/2015  . Glaucoma 08/04/2015  . HLD (hyperlipidemia) 08/04/2015  . Mild cognitive disorder 08/04/2015  . Arthritis, degenerative 08/04/2015  . Asymptomatic varicose veins 08/04/2015  . TI (tricuspid incompetence) 11/21/2014  . Paroxysmal atrial fibrillation ([place]) 11/19/2014  . Esophagitis, reflux 11/08/2014  . MI (mitral incompetence) 11/08/2014    . Nonspecific (abnormal) findings on radiological and other examination of gastrointestinal tract 03/27/2012    Past Medical History  Diagnosis Date  . Hypertension   . COPD (chronic obstructive pulmonary disease) ([place])   . Seasonal allergies   . Heart murmur   . GERD (gastroesophageal reflux disease)   . Arthritis     left ankle    Social History   Social History  . Marital Status: Married    Spouse Name: N/A  . Number of Children: N/A  . Years of Education: N/A   Occupational History  . Not on file.   Social History Main Topics  . Smoking status: Former Smoker    Types: Cigars  . Smokeless tobacco: Not on file     Comment: quit about 15 years ago  . Alcohol Use: Yes     Comment: occasionally  . Drug Use: No  . Sexual Activity: Not on file   Other Topics Concern  . Not on file   Social History Narrative    Outpatient Prescriptions Prior to Visit  Medication Sig Dispense Refill  . albuterol (PROAIR HFA) 108 (90 BASE) MCG/ACT inhaler Inhale into the lungs.    Marland Kitchen amLODipine (NORVASC) 5 MG tablet Take 5 mg by mouth daily.    Marland Kitchen aspirin 81 MG tablet Take 81 mg by mouth daily.    . brimonidine (ALPHAGAN) 0.2 % ophthalmic solution 1 drop 3 (three) times daily.    . dorzolamide (TRUSOPT) 2 % ophthalmic  solution 1 drop 3 (three) times daily.    . fluticasone (FLONASE) 50 MCG/ACT nasal spray Place 2 sprays into both nostrils daily. 48 g 3  . HYDROcodone-acetaminophen (NORCO) 10-325 MG tablet Take 1 tablet by mouth 4 (four) times daily as needed. 100 tablet 0  . latanoprost (XALATAN) 0.005 % ophthalmic solution 1 drop at bedtime.    . metoprolol tartrate (LOPRESSOR) 25 MG tablet Take 25 mg by mouth 2 (two) times daily.    . mometasone (ASMANEX) 220 MCG/INH inhaler Inhale 2 puffs into the lungs daily.    . Multiple Vitamin (MULTI-VITAMINS) TABS Take by mouth.    Marland Kitchen omeprazole (PRILOSEC) 40 MG capsule Take by mouth.    . pravastatin (PRAVACHOL) 20 MG tablet Take by mouth.     . terazosin (HYTRIN) 5 MG capsule Take 5 mg by mouth at bedtime.    . Travoprost, BAK Free, (TRAVATAN Z) 0.004 % SOLN ophthalmic solution Apply to eye.     No facility-administered medications prior to visit.    Allergies  Allergen Reactions  . Iodinated Diagnostic Agents Other (See Comments)  . Sulfa Antibiotics Itching and Rash    Allergic reaction was over 30years ago. Patient unsure of exact reaction    Review of Systems  Constitutional: Negative for fever and malaise/fatigue.  Respiratory: Negative for cough, shortness of breath and wheezing.   Cardiovascular: Negative for chest pain, palpitations, orthopnea, claudication, leg swelling and PND.  Gastrointestinal: Negative for heartburn, nausea, vomiting, abdominal pain, diarrhea, constipation, blood in stool and melena.       Some indigestion.  Neurological: Negative for dizziness, weakness and headaches.   Objective:  BP 126/50 mmHg  Pulse 60  Temp(Src) 97.7 F (36.5 C) (Oral)  Resp 16  Wt 160 lb (72.576 kg)  Physical Exam  Constitutional: He is oriented to person, place, and time and well-developed, well-nourished, and in no distress.  HENT:  Head: Normocephalic.  Eyes: Pupils are equal, round, and reactive to light.  Neck: Normal range of motion.  Cardiovascular: Normal rate, regular rhythm and normal heart sounds.   Pulmonary/Chest: Effort normal and breath sounds normal.  Neurological: He is alert and oriented to person, place, and time. Gait normal.  Skin: Skin is warm and dry.  Psychiatric: Mood, memory, affect and judgment normal.    Assessment and Plan :  1. Essential (primary) hypertension CBC with Differential/Platelet - Comprehensive metabolic panel - TSH  2. HLD (hyperlipidemia)   3. Esophagitis, reflux Controlled 4. Retroperitoneal fibrosis Presently  Asymptomatic. I have done the exam and reviewed the above chart and it is accurate to the best of my knowledge.     Miguel Aschoff  MD [facility] 02/13/2016 8:16 AM

## 2016-02-14 ENCOUNTER — Telehealth: Payer: Self-pay

## 2016-02-14 LAB — CBC WITH DIFFERENTIAL/PLATELET
BASOS ABS: 0.1 10*3/uL (ref 0.0–0.2)
Basos: 1 %
EOS (ABSOLUTE): 0.7 10*3/uL — ABNORMAL HIGH (ref 0.0–0.4)
EOS: 8 %
HEMATOCRIT: 41.3 % (ref 37.5–51.0)
HEMOGLOBIN: 13.7 g/dL (ref 12.6–17.7)
IMMATURE GRANULOCYTES: 1 %
Immature Grans (Abs): 0.1 10*3/uL (ref 0.0–0.1)
LYMPHS: 23 %
Lymphocytes Absolute: 2 10*3/uL (ref 0.7–3.1)
MCH: 29.5 pg (ref 26.6–33.0)
MCHC: 33.2 g/dL (ref 31.5–35.7)
MCV: 89 fL (ref 79–97)
Monocytes Absolute: 0.8 10*3/uL (ref 0.1–0.9)
Monocytes: 9 %
NEUTROS PCT: 58 %
Neutrophils Absolute: 5.1 10*3/uL (ref 1.4–7.0)
Platelets: 209 10*3/uL (ref 150–379)
RBC: 4.64 x10E6/uL (ref 4.14–5.80)
RDW: 13.8 % (ref 12.3–15.4)
WBC: 8.7 10*3/uL (ref 3.4–10.8)

## 2016-02-14 LAB — COMPREHENSIVE METABOLIC PANEL
ALBUMIN: 4.1 g/dL (ref 3.5–4.7)
ALK PHOS: 76 IU/L (ref 39–117)
ALT: 15 IU/L (ref 0–44)
AST: 16 IU/L (ref 0–40)
Albumin/Globulin Ratio: 1.6 (ref 1.2–2.2)
BUN / CREAT RATIO: 18 (ref 10–24)
BUN: 16 mg/dL (ref 8–27)
Bilirubin Total: 0.5 mg/dL (ref 0.0–1.2)
CALCIUM: 8.7 mg/dL (ref 8.6–10.2)
CO2: 20 mmol/L (ref 18–29)
Chloride: 103 mmol/L (ref 96–106)
Creatinine, Ser: 0.91 mg/dL (ref 0.76–1.27)
GFR calc Af Amer: 92 mL/min/{1.73_m2} (ref >59)
GFR, EST NON AFRICAN AMERICAN: 79 mL/min/{1.73_m2} (ref >59)
GLOBULIN, TOTAL: 2.5 g/dL (ref 1.5–4.5)
GLUCOSE: 104 mg/dL — AB (ref 65–99)
Potassium: 4.2 mmol/L (ref 3.5–5.2)
SODIUM: 141 mmol/L (ref 134–144)
Total Protein: 6.6 g/dL (ref 6.0–8.5)

## 2016-02-14 LAB — TSH: TSH: 5 u[IU]/mL — AB (ref 0.450–4.500)

## 2016-02-14 NOTE — Telephone Encounter (Signed)
-----   Message from Jerrol Banana., MD sent at 02/14/2016  8:06 AM EDT ----- Labs okay. Check TSH on next visit. Mildly hypothyroid.

## 2016-02-14 NOTE — Telephone Encounter (Signed)
LMTCB ED 

## 2016-02-14 NOTE — Telephone Encounter (Signed)
Advised patient of results.  

## 2016-02-23 ENCOUNTER — Encounter: Payer: Self-pay | Admitting: Family Medicine

## 2016-05-17 ENCOUNTER — Encounter: Payer: Self-pay | Admitting: Family Medicine

## 2016-05-17 ENCOUNTER — Ambulatory Visit
Admission: RE | Admit: 2016-05-17 | Discharge: 2016-05-17 | Disposition: A | Discharge status: Home / Self Care | Payer: PPO | Source: Ambulatory Visit | Attending: Family Medicine | Admitting: Family Medicine

## 2016-05-17 ENCOUNTER — Ambulatory Visit (INDEPENDENT_AMBULATORY_CARE_PROVIDER_SITE_OTHER): Payer: PPO | Admitting: Family Medicine

## 2016-05-17 VITALS — BP 88/42 | HR 68 | Temp 99.0°F | Resp 14 | Wt 157.0 lb

## 2016-05-17 DIAGNOSIS — I9589 Other hypotension: Secondary | ICD-10-CM

## 2016-05-17 DIAGNOSIS — R05 Cough: Secondary | ICD-10-CM | POA: Diagnosis not present

## 2016-05-17 DIAGNOSIS — J101 Influenza due to other identified influenza virus with other respiratory manifestations: Secondary | ICD-10-CM

## 2016-05-17 DIAGNOSIS — R509 Fever, unspecified: Secondary | ICD-10-CM | POA: Diagnosis not present

## 2016-05-17 DIAGNOSIS — R059 Cough, unspecified: Secondary | ICD-10-CM

## 2016-05-17 LAB — POCT INFLUENZA A/B
INFLUENZA B, POC: NEGATIVE
Influenza A, POC: POSITIVE — AB

## 2016-05-17 MED ORDER — OMEPRAZOLE 40 MG PO CPDR: 40.0000 mg | DELAYED_RELEASE_CAPSULE | Freq: Every day | ORAL | 3 refills | Status: DC

## 2016-05-17 MED ORDER — OSELTAMIVIR PHOSPHATE 75 MG PO CAPS: 75.0000 mg | ORAL_CAPSULE | Freq: Two times a day (BID) | ORAL | 0 refills | Status: DC

## 2016-05-17 NOTE — Patient Instructions (Signed)
Do not take Metoprolol until Monday August 7th.

## 2016-05-17 NOTE — Progress Notes (Signed)
Advised  ED 

## 2016-05-17 NOTE — Progress Notes (Signed)
Subjective:  HPI  Patient states he started to feel bad 2 days ago with cold symptoms and then yesterday he felt worse, felt feverish, had nausea and headache. Was not able to check his temperature. He did take Tylenol every 3 and 1/2 hours. He feels a little better today but still feels weak. Also has had runny nose and drainage and slight cough but has not felt congested.  Prior to Admission medications   Medication Sig Start Date End Date Taking? Authorizing Provider  albuterol (PROAIR HFA) 108 (90 BASE) MCG/ACT inhaler Inhale into the lungs. 06/08/14  Yes Historical Provider, MD  aspirin 81 MG tablet Take 81 mg by mouth daily.   Yes Historical Provider, MD  brimonidine (ALPHAGAN) 0.2 % ophthalmic solution 1 drop 3 (three) times daily.   Yes Historical Provider, MD  fluticasone (FLONASE) 50 MCG/ACT nasal spray Place 2 sprays into both nostrils daily. 08/16/15  Yes Richard Maceo Pro., MD  HYDROcodone-acetaminophen [facility]) 10-325 MG tablet Take 1 tablet by mouth 4 (four) times daily as needed. 08/16/15  Yes Richard Maceo Pro., MD  latanoprost (XALATAN) 0.005 % ophthalmic solution 1 drop at bedtime.   Yes Historical Provider, MD  metoprolol tartrate (LOPRESSOR) 25 MG tablet Take 25 mg by mouth 2 (two) times daily.   Yes Historical Provider, MD  mometasone [facility]) 220 MCG/INH inhaler Inhale 2 puffs into the lungs daily.   Yes Historical Provider, MD  Multiple Vitamin (MULTI-VITAMINS) TABS Take by mouth.   Yes Historical Provider, MD  omeprazole (PRILOSEC) 40 MG capsule Take by mouth. 12/17/14  Yes Historical Provider, MD  pravastatin (PRAVACHOL) 20 MG tablet Take by mouth. 03/17/12  Yes Historical Provider, MD  terazosin (HYTRIN) 5 MG capsule Take 5 mg by mouth at bedtime.   Yes Historical Provider, MD    Patient Active Problem List   Diagnosis Date Noted  . Chronic kidney disease 08/16/2015  . Supraventricular tachycardia ([place]) 08/16/2015  . Allergic rhinitis 08/04/2015  . Airway  hyperreactivity 08/04/2015  . Benign fibroma of prostate 08/04/2015  . CAFL (chronic airflow limitation) ([place]) 08/04/2015  . Essential (primary) hypertension 08/04/2015  . Acid reflux 08/04/2015  . Glaucoma 08/04/2015  . HLD (hyperlipidemia) 08/04/2015  . Mild cognitive disorder 08/04/2015  . Arthritis, degenerative 08/04/2015  . Asymptomatic varicose veins 08/04/2015  . TI (tricuspid incompetence) 11/21/2014  . Paroxysmal atrial fibrillation ([place]) 11/19/2014  . Esophagitis, reflux 11/08/2014  . MI (mitral incompetence) 11/08/2014  . Nonspecific (abnormal) findings on radiological and other examination of gastrointestinal tract 03/27/2012    Past Medical History:  Diagnosis Date  . Arthritis    left ankle  . COPD (chronic obstructive pulmonary disease) ([place])   . GERD (gastroesophageal reflux disease)   . Heart murmur   . Hypertension   . Seasonal allergies     Social History   Social History  . Marital status: Married    Spouse name: N/A  . Number of children: N/A  . Years of education: N/A   Occupational History  . Not on file.   Social History Main Topics  . Smoking status: Former Smoker    Types: Cigars  . Smokeless tobacco: Never Used     Comment: quit about 15 years ago  . Alcohol use Yes     Comment: occasionally  . Drug use: No  . Sexual activity: Not Currently   Other Topics Concern  . Not on file   Social History Narrative  . No narrative on file  Allergies  Allergen Reactions  . Iodinated Diagnostic Agents Other (See Comments)  . Sulfa Antibiotics Itching and Rash    Allergic reaction was over 30years ago. Patient unsure of exact reaction    Review of Systems  Constitutional: Positive for fever and malaise/fatigue.  Respiratory: Positive for cough.   Cardiovascular: Negative.   Gastrointestinal: Positive for nausea.  Musculoskeletal: Positive for joint pain.  Neurological: Positive for weakness and headaches.    Immunization History    Administered Date(s) Administered  . Td 01/24/2004   Objective:  BP (!) 88/42   Pulse 68   Temp 99 F (37.2 C)   Resp 14   Wt 157 lb (71.2 kg)   SpO2 96%   BMI 22.21 kg/m   Physical Exam  Constitutional: He is oriented to person, place, and time and well-developed, well-nourished, and in no distress. He has a sickly appearance.  HENT:  Head: Normocephalic and atraumatic.  Right Ear: External ear normal.  Left Ear: External ear normal.  Mouth/Throat: Oropharynx is clear and moist.  Eyes: Conjunctivae are normal. Pupils are equal, round, and reactive to light.  Neck: Normal range of motion. Neck supple.  Cardiovascular: Normal rate, regular rhythm, normal heart sounds and intact distal pulses.   No murmur heard. Pulmonary/Chest: Effort normal and breath sounds normal. No respiratory distress. He has no wheezes.  Abdominal: Soft. He exhibits no distension. There is no tenderness. There is no rebound.  Neurological: He is alert and oriented to person, place, and time.  Skin: Skin is warm and dry.  Psychiatric: Mood, memory, affect and judgment normal.    Lab Results  Component Value Date   WBC 8.7 02/13/2016   HGB 14.1 01/10/2015   HCT 41.3 02/13/2016   PLT 209 02/13/2016   GLUCOSE 104 (H) 02/13/2016   CHOL 153 10/30/2013   TRIG 61 10/30/2013   HDL 58 10/30/2013   LDLCALC 90 10/30/2013   TSH 5.000 (H) 02/13/2016   PSA 2.8 01/08/2014   INR 1.0 04/01/2012    CMP     Component Value Date/Time   NA 141 02/13/2016 0904   NA 138 01/10/2015 1440   K 4.2 02/13/2016 0904   K 3.8 01/10/2015 1440   CL 103 02/13/2016 0904   CL 107 01/10/2015 1440   CO2 20 02/13/2016 0904   CO2 26 01/10/2015 1440   GLUCOSE 104 (H) 02/13/2016 0904   GLUCOSE 106 (H) 01/10/2015 1440   BUN 16 02/13/2016 0904   BUN 18 01/10/2015 1440   CREATININE 0.91 02/13/2016 0904   CREATININE 0.97 01/10/2015 1440   CALCIUM 8.7 02/13/2016 0904   CALCIUM 8.4 (L) 01/10/2015 1440   PROT 6.6 02/13/2016  0904   PROT 6.7 01/10/2015 1440   ALBUMIN 4.1 02/13/2016 0904   ALBUMIN 3.8 01/10/2015 1440   AST 16 02/13/2016 0904   AST 24 01/10/2015 1440   ALT 15 02/13/2016 0904   ALT 18 01/10/2015 1440   ALKPHOS 76 02/13/2016 0904   ALKPHOS 65 01/10/2015 1440   BILITOT 0.5 02/13/2016 0904   BILITOT 0.6 01/10/2015 1440   GFRNONAA 79 02/13/2016 0904   GFRNONAA >60 01/10/2015 1440   GFRAA 92 02/13/2016 0904   GFRAA >60 01/10/2015 1440    Assessment and Plan :  1. Influenza A Positive today. Will treat with Tamiflu. Still want him to get Chest Xray make sure no underlying issues is going on. Rest and fluids  2. Other specified fever - DG Chest 2 View; Future - POCT  Influenza A/B Rule out secondary pneumonia. We'll treat this with doxycycline if there is a pneumonia on chest x-ray. 3. Cough - DG Chest 2 View; Future  4. Hypotension Will hold Metoprolol until Monday August 7th. Follow. 5. Retroperitoneal fibrosis Patient was seen and examined by Dr. Eulas Post and note was scribed by Theressa Millard, [place].    Miguel Aschoff MD [facility] 05/17/2016 11:09 AM

## 2016-05-25 ENCOUNTER — Encounter: Payer: Self-pay | Admitting: Family Medicine

## 2016-05-25 ENCOUNTER — Ambulatory Visit (INDEPENDENT_AMBULATORY_CARE_PROVIDER_SITE_OTHER): Payer: PPO | Admitting: Family Medicine

## 2016-05-25 VITALS — BP 116/66 | HR 60 | Temp 98.2°F | Resp 16 | Wt 158.0 lb

## 2016-05-25 DIAGNOSIS — Z23 Encounter for immunization: Secondary | ICD-10-CM

## 2016-05-25 DIAGNOSIS — S61309A Unspecified open wound of unspecified finger with damage to nail, initial encounter: Secondary | ICD-10-CM

## 2016-05-25 NOTE — Progress Notes (Signed)
Patient: Mark Sanchez. Male    DOB: 28-Jan-1933   82 y.o.   MRN: [id number] Visit Date: 05/25/2016  Today's Provider: Vernie Murders, PA   Chief Complaint  Patient presents with  . Nail Problem    right thumb   Subjective:    HPI Patient reports injury to right thumb nail about 7 years ago. Patient reports that yesterday his nail got caught on something and the nail was pulled off.  Past Medical History:  Diagnosis Date  . Arthritis    left ankle  . COPD (chronic obstructive pulmonary disease) ([place])   . GERD (gastroesophageal reflux disease)   . Heart murmur   . Hypertension   . Seasonal allergies    Past Surgical History:  Procedure Laterality Date  . EUS  03/27/2012   Procedure: UPPER ENDOSCOPIC ULTRASOUND (EUS) LINEAR;  Surgeon: Milus Banister, MD;  Location: [facility];  Service: Endoscopy;  Laterality: N/A;  [place] EUS  . FINE NEEDLE ASPIRATION  03/27/2012   Procedure: FINE NEEDLE ASPIRATION (FNA) LINEAR;  Surgeon: Milus Banister, MD;  Location: [facility] ENDOSCOPY;  Service: Endoscopy;  Laterality: N/A;  . HERNIA REPAIR    . ORIF ANKLE FRACTURE     Family History  Problem Relation Age of Onset  . Heart disease Mother   . Hypertension Mother   . Arthritis Mother   . Breast cancer Sister   . Breast cancer Sister    Allergies  Allergen Reactions  . Iodinated Diagnostic Agents Other (See Comments)  . Sulfa Antibiotics Itching and Rash    Allergic reaction was over 30years ago. Patient unsure of exact reaction   Current Meds  Medication Sig  . albuterol (PROAIR HFA) 108 (90 BASE) MCG/ACT inhaler Inhale into the lungs.  Marland Kitchen aspirin 81 MG tablet Take 81 mg by mouth daily.  . brimonidine (ALPHAGAN) 0.2 % ophthalmic solution 1 drop 3 (three) times daily.  . fluticasone (FLONASE) 50 MCG/ACT nasal spray Place 2 sprays into both nostrils daily.  Marland Kitchen HYDROcodone-acetaminophen (NORCO) 10-325 MG tablet Take 1 tablet by mouth 4 (four) times daily as needed.  .  latanoprost (XALATAN) 0.005 % ophthalmic solution 1 drop at bedtime.  . metoprolol tartrate (LOPRESSOR) 25 MG tablet Take 25 mg by mouth 2 (two) times daily.  . mometasone (ASMANEX) 220 MCG/INH inhaler Inhale 2 puffs into the lungs daily.  . Multiple Vitamin (MULTI-VITAMINS) TABS Take by mouth.  Marland Kitchen omeprazole (PRILOSEC) 40 MG capsule Take 1 capsule (40 mg total) by mouth daily.  . pravastatin (PRAVACHOL) 20 MG tablet Take by mouth.  . terazosin (HYTRIN) 5 MG capsule Take 5 mg by mouth at bedtime.    Review of Systems  HENT: Negative.   Respiratory: Negative.   Cardiovascular: Negative.   Skin:       Blood under the right thumb nail.     Social History  Substance Use Topics  . Smoking status: Former Smoker    Types: Cigars  . Smokeless tobacco: Never Used     Comment: quit about 15 years ago  . Alcohol use Yes     Comment: occasionally   Objective:   BP 116/66 (BP Location: Right Arm, Patient Position: Sitting, Cuff Size: Large)   Pulse 60   Temp 98.2 F (36.8 C) (Oral)   Resp 16   Wt 158 lb (71.7 kg)   SpO2 96%   BMI 22.35 kg/m   Physical Exam  Skin:  Blood under the right  thumb nail. Nail raised away for nail bed back to the matrix. Some dried blood around the edges. No purulent discharge.       Assessment & Plan:     1. Nail avulsion, finger, initial encounter History of damage to right thumb nail 7 years ago with crush injury. Hit the nail yesterday and caught it again last night. Nail is pulled away from nail bed with dried blood around edges. Will soak in warm Epsom saltwater and probably have the nail come off completely in a week. Use Band-Aid dressing after complete drying after soak. Recheck in 7-10 days if needed. - Td vaccine greater than or equal to 7yo preservative free IM  2. Need for TD vaccine - Td vaccine greater than or equal to 7yo preservative free IM       Vernie Murders, PA  [facility]

## 2016-06-08 ENCOUNTER — Ambulatory Visit (INDEPENDENT_AMBULATORY_CARE_PROVIDER_SITE_OTHER): Payer: PPO | Admitting: Physician Assistant

## 2016-06-08 VITALS — BP 132/64 | HR 60 | Temp 97.6°F | Resp 14 | Wt 161.0 lb

## 2016-06-08 DIAGNOSIS — S61209A Unspecified open wound of unspecified finger without damage to nail, initial encounter: Secondary | ICD-10-CM

## 2016-06-08 DIAGNOSIS — IMO0002 Reserved for concepts with insufficient information to code with codable children: Secondary | ICD-10-CM

## 2016-06-08 NOTE — Patient Instructions (Signed)
Nail Avulsion Injury °Nail avulsion means that you have lost the whole, or part of a nail. The nail will usually grow back in 2 to 6 months. If your injury damaged the growth center of the nail, the nail may be deformed, split, or not stuck to the nail bed. Sometimes the avulsed nail is stitched back in place. This provides temporary protection to the nail bed until the new nail grows in.  °HOME CARE INSTRUCTIONS  °· Raise (elevate) your injury as much as possible. °· Protect the injury and cover it with bandages (dressings) or splints as instructed. °· Change dressings as instructed. °SEEK MEDICAL CARE IF:  °· There is increasing pain, redness, or swelling. °· You cannot move your fingers or toes. °  °This information is not intended to replace advice given to you by your health care provider. Make sure you discuss any questions you have with your health care provider. °  °Document Released: 11/08/2004 Document Revised: 12/24/2011 Document Reviewed: 09/02/2009 °Elsevier Interactive Patient Education ©2016 Elsevier Inc. ° °

## 2016-06-08 NOTE — Progress Notes (Signed)
Patient: Mark Sanchez. Male    DOB: 1933-10-04   82 y.o.   MRN: [id number] Visit Date: 06/08/2016  Today's Provider: Mar Daring, PA-C   Chief Complaint  Patient presents with  . Nail Problem    follow up   Subjective:    HPI  Patient is here for follow up after his last visit with Simona Huh on 8/11 for nail avulsion. He was advised to soak nail in epsom salt, bandage it and Td vaccine was updated.  He has been soaking his thumb and applied Neosporyn which helped the redness around the nail. Nail looks better but still not detached. Patient states Simona Huh told him to come back in 10 days if nail has not fallen off yet.   Nail is no longer tender, no bloody discharge, nail bed is not tender, no purulent discharge, no erythema, no warmth to touch.    Allergies  Allergen Reactions  . Iodinated Diagnostic Agents Other (See Comments)  . Sulfa Antibiotics Itching and Rash    Allergic reaction was over 30years ago. Patient unsure of exact reaction   Current Meds  Medication Sig  . albuterol (PROAIR HFA) 108 (90 BASE) MCG/ACT inhaler Inhale into the lungs.  Marland Kitchen aspirin 81 MG tablet Take 81 mg by mouth daily.  . brimonidine (ALPHAGAN) 0.2 % ophthalmic solution 1 drop 3 (three) times daily.  . fluticasone (FLONASE) 50 MCG/ACT nasal spray Place 2 sprays into both nostrils daily.  Marland Kitchen HYDROcodone-acetaminophen (NORCO) 10-325 MG tablet Take 1 tablet by mouth 4 (four) times daily as needed.  . latanoprost (XALATAN) 0.005 % ophthalmic solution 1 drop at bedtime.  . metoprolol tartrate (LOPRESSOR) 25 MG tablet Take 25 mg by mouth 2 (two) times daily.  . mometasone (ASMANEX) 220 MCG/INH inhaler Inhale 2 puffs into the lungs daily.  . Multiple Vitamin (MULTI-VITAMINS) TABS Take by mouth.  Marland Kitchen omeprazole (PRILOSEC) 40 MG capsule Take 1 capsule (40 mg total) by mouth daily.  . pravastatin (PRAVACHOL) 20 MG tablet Take by mouth.  . terazosin (HYTRIN) 5 MG capsule Take 5 mg by mouth  at bedtime.    Review of Systems  Constitutional: Negative.   Respiratory: Negative.   Cardiovascular: Negative.   Musculoskeletal: Negative.   Skin: Positive for wound.       Right thumb nail is blue and partially broken off    Social History  Substance Use Topics  . Smoking status: Former Smoker    Types: Cigars  . Smokeless tobacco: Never Used     Comment: quit about 15 years ago  . Alcohol use Yes     Comment: occasionally   Objective:   BP 132/64   Pulse 60   Temp 97.6 F (36.4 C)   Resp 14   Wt 161 lb (73 kg)   BMI 22.77 kg/m   Physical Exam  Constitutional: He appears well-developed and well-nourished. No distress.  HENT:  Head: Normocephalic and atraumatic.  Neurological: He is alert.  Skin: He is not diaphoretic.  Thumb nail on right hand is almost completely avulsed. Nail is dark in color. Nail bed is hardened slightly. No tenderness to palpation or manipulation. No drainage. No signs of infection. Nail is only attached at medial corner, all other edges detached. Nail has been clipped down to keep from snagging.   Psychiatric: He has a normal mood and affect. His behavior is normal. Judgment and thought content normal.  Vitals reviewed.  Assessment & Plan:     1. Avulsion of nail plate Suspect nail will eventually fall off from nail plate where it remains attached. Nail has darkened appearance like it is not receiving much supply to keep healthy. Advised to continue epsom salt soaks and keep covered when he is at work to keep from hitting nail and having it ripped off, which could cause more damage. Suspect nail to fall off in next 1-2 weeks. He is to call if any signs of infection develop.        Mar Daring, PA-C  [facility]

## 2016-06-11 DIAGNOSIS — I340 Nonrheumatic mitral (valve) insufficiency: Secondary | ICD-10-CM | POA: Diagnosis not present

## 2016-06-11 DIAGNOSIS — I351 Nonrheumatic aortic (valve) insufficiency: Secondary | ICD-10-CM | POA: Diagnosis not present

## 2016-06-11 DIAGNOSIS — E782 Mixed hyperlipidemia: Secondary | ICD-10-CM | POA: Diagnosis not present

## 2016-06-11 DIAGNOSIS — I071 Rheumatic tricuspid insufficiency: Secondary | ICD-10-CM | POA: Diagnosis not present

## 2016-06-11 DIAGNOSIS — I10 Essential (primary) hypertension: Secondary | ICD-10-CM | POA: Diagnosis not present

## 2016-06-11 DIAGNOSIS — I480 Paroxysmal atrial fibrillation: Secondary | ICD-10-CM | POA: Diagnosis not present

## 2016-06-11 DIAGNOSIS — N182 Chronic kidney disease, stage 2 (mild): Secondary | ICD-10-CM | POA: Diagnosis not present

## 2016-06-11 DIAGNOSIS — K210 Gastro-esophageal reflux disease with esophagitis: Secondary | ICD-10-CM | POA: Diagnosis not present

## 2016-07-02 ENCOUNTER — Telehealth: Payer: Self-pay | Admitting: Family Medicine

## 2016-07-02 NOTE — Telephone Encounter (Signed)
Pt's wife is calling for a refill on his eye drops.  She said the [place] was suppose to send them via mail order but they have not received them yet and they can't get in touch with the [place].  She said his eyes are really irritated and he needs the eye drops ASAP.  Brimonidine 0.2% brinzolamid1% oph susp Dispense 1 drop each eye tid.   She would like them sent to [facility].  Their call back is  [phone]  Thanks Con Memos

## 2016-07-03 MED ORDER — BRIMONIDINE TARTRATE 0.2 % OP SOLN: 1.0000 [drp] | Freq: Three times a day (TID) | OPHTHALMIC | 5 refills | Status: DC

## 2016-07-03 NOTE — Telephone Encounter (Signed)
Please review-aa 

## 2016-07-03 NOTE — Telephone Encounter (Signed)
RX sent in, wife Rhae Lerner

## 2016-07-03 NOTE — Telephone Encounter (Signed)
ok 

## 2016-07-12 ENCOUNTER — Ambulatory Visit (INDEPENDENT_AMBULATORY_CARE_PROVIDER_SITE_OTHER): Payer: PPO | Admitting: Family Medicine

## 2016-07-12 VITALS — BP 144/56 | HR 76 | Temp 98.1°F | Resp 16 | Wt 160.0 lb

## 2016-07-12 DIAGNOSIS — H109 Unspecified conjunctivitis: Secondary | ICD-10-CM | POA: Diagnosis not present

## 2016-07-12 MED ORDER — CIPROFLOXACIN HCL 0.3 % OP SOLN: 1.0000 [drp] | OPHTHALMIC | 0 refills | Status: DC

## 2016-07-12 NOTE — Progress Notes (Signed)
[place]  MRN: [id number] DOB: 09-07-1933  Subjective:  HPI   The patient is an 82 year old male who presents for evaluation of his head and chest congestion  He has had symptoms for a few weeks with it getting much worse 3 days ago.  He has had some post nasal drainage, left sided throat irritation, chills, dry cough and hoarseness.  He has started with irritation and redness of his eyes for about 2 days.  This morning they were matted together.  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  He states it is as though there is a blockage.  Patient Active Problem List   Diagnosis Date Noted  . Chronic kidney disease 08/16/2015  . Supraventricular tachycardia ([place]) 08/16/2015  . Allergic rhinitis 08/04/2015  . Airway hyperreactivity 08/04/2015  . Benign fibroma of prostate 08/04/2015  . CAFL (chronic airflow limitation) ([place]) 08/04/2015  . Essential (primary) hypertension 08/04/2015  . Acid reflux 08/04/2015  . Glaucoma 08/04/2015  . HLD (hyperlipidemia) 08/04/2015  . Mild cognitive disorder 08/04/2015  . Arthritis, degenerative 08/04/2015  . Asymptomatic varicose veins 08/04/2015  . TI (tricuspid incompetence) 11/21/2014  . Paroxysmal atrial fibrillation ([place]) 11/19/2014  . Esophagitis, reflux 11/08/2014  . MI (mitral incompetence) 11/08/2014  . Nonspecific (abnormal) findings on radiological and other examination of gastrointestinal tract 03/27/2012    Past Medical History:  Diagnosis Date  . Arthritis    left ankle  . COPD (chronic obstructive pulmonary disease) ([place])   . GERD (gastroesophageal reflux disease)   . Heart murmur   . Hypertension   . Seasonal allergies     Social History   Social History  . Marital status: Married    Spouse name: N/A  . Number of children: N/A  . Years of education: N/A   Occupational History  . Not on file.   Social History Main Topics   . Smoking status: Former Smoker    Types: Cigars  . Smokeless tobacco: Never Used     Comment: quit about 15 years ago  . Alcohol use Yes     Comment: occasionally  . Drug use: No  . Sexual activity: Not Currently   Other Topics Concern  . Not on file   Social History Narrative  . No narrative on file    Outpatient Encounter Prescriptions as of 07/12/2016  Medication Sig Note  . albuterol (PROAIR HFA) 108 (90 BASE) MCG/ACT inhaler Inhale into the lungs. 08/04/2015: Medication taken as needed.  Received from: [facility]  . aspirin 81 MG tablet Take 81 mg by mouth daily.   . Brinzolamide-Brimonidine (SIMBRINZA) 1-0.2 % SUSP Apply to eye.   . fluticasone (FLONASE) 50 MCG/ACT nasal spray Place 2 sprays into both nostrils daily.   Marland Kitchen HYDROcodone-acetaminophen (NORCO) 10-325 MG tablet Take 1 tablet by mouth 4 (four) times daily as needed.   . metoprolol tartrate (LOPRESSOR) 25 MG tablet Take 25 mg by mouth 2 (two) times daily.   . mometasone (ASMANEX) 220 MCG/INH inhaler Inhale 2 puffs into the lungs daily.   . Multiple Vitamin (MULTI-VITAMINS) TABS Take by mouth. 08/16/2015: Received from: [facility]  . omeprazole (PRILOSEC) 40 MG capsule Take 1 capsule (40 mg total) by mouth daily.   . pravastatin (PRAVACHOL) 20 MG tablet Take by mouth. 08/04/2015: Received from: [facility]  . terazosin (HYTRIN)  5 MG capsule Take 5 mg by mouth at bedtime.   . timolol (BETIMOL) 0.25 % ophthalmic solution 1-2 drops 2 (two) times daily.   . [DISCONTINUED] brimonidine (ALPHAGAN) 0.2 % ophthalmic solution Place 1 drop into both eyes 3 (three) times daily.   . [DISCONTINUED] latanoprost (XALATAN) 0.005 % ophthalmic solution 1 drop at bedtime.    No facility-administered encounter medications on file as of 07/12/2016.     Allergies  Allergen Reactions  . Iodinated Diagnostic Agents Other (See Comments)  . Sulfa Antibiotics Itching and Rash     Allergic reaction was over 30years ago. Patient unsure of exact reaction    Review of Systems  Constitutional: Positive for chills and malaise/fatigue. Negative for fever.  HENT: Positive for congestion, sore throat and tinnitus (chronic). Negative for ear discharge, ear pain, hearing loss and nosebleeds.   Eyes: Positive for pain, discharge and redness. Negative for blurred vision, double vision and photophobia.  Respiratory: Positive for cough. Negative for sputum production, shortness of breath and wheezing.   Cardiovascular: Negative for chest pain, palpitations, orthopnea, claudication, leg swelling and PND.  Neurological: Negative for weakness and headaches.   Objective:  BP (!) 144/56   Pulse 76   Temp 98.1 F (36.7 C) (Oral)   Resp 16   Wt 160 lb (72.6 kg)   BMI 22.63 kg/m   Physical Exam  Constitutional: He is oriented to person, place, and time and well-developed, well-nourished, and in no distress.  HENT:  Head: Normocephalic and atraumatic.  Right Ear: External ear normal.  Left Ear: External ear normal.  Nose: Nose normal.  Mouth/Throat: Oropharynx is clear and moist.  Eyes: Right eye exhibits discharge. Left eye exhibits discharge.  Erythematous   Neck: Normal range of motion. Neck supple.  Cardiovascular: Normal rate, regular rhythm, normal heart sounds and intact distal pulses.   Pulmonary/Chest: Effort normal and breath sounds normal.  Neurological: He is alert and oriented to person, place, and time. Gait normal.  Psychiatric: Mood, memory, affect and judgment normal.    Assessment and Plan :  1. Bilateral conjunctivitis  - ciprofloxacin (CILOXAN) 0.3 % ophthalmic solution; Place 1 drop into both eyes every 2 (two) hours. Administer 1 drop, every 2 hours, while awake, for 2 days. Then 1 drop, every 4 hours, while awake, for the next 5 days.  Dispense: 5 mL; Refill: 0 2. URI HPI, Exam and A&P Transcribed under the direction and in the presence of Wilhemena Durie., MD. Electronically Signed: Althea Charon, RMA I have done the exam and reviewed the chart and it is accurate to the best of my knowledge. Miguel Aschoff M.D. [facility]

## 2016-07-16 ENCOUNTER — Ambulatory Visit (INDEPENDENT_AMBULATORY_CARE_PROVIDER_SITE_OTHER): Payer: PPO | Admitting: Family Medicine

## 2016-07-16 VITALS — BP 144/62 | HR 74 | Temp 97.8°F | Resp 16 | Wt 156.0 lb

## 2016-07-16 DIAGNOSIS — J0111 Acute recurrent frontal sinusitis: Secondary | ICD-10-CM | POA: Diagnosis not present

## 2016-07-16 MED ORDER — AMOXICILLIN-POT CLAVULANATE 875-125 MG PO TABS: 1.0000 | ORAL_TABLET | Freq: Two times a day (BID) | ORAL | 0 refills | Status: DC

## 2016-07-16 NOTE — Progress Notes (Signed)
[place]  MRN: [id number] DOB: 17-Sep-1933  Subjective:  HPI  Patient was seen on 07/12/16 and was diagnosed with bilateral conjunctivitis. Treated with Cipro eye drops. Eyes feels better and he is still using drops. HE feels like he is developing sinus infection now. Symptoms are nasal congestion and pressure, head feels full, post nasal drainage present. He does use Netti pot 2 to 3 times daily, Flonase nasal spray and has taking OTC tylenol sinus. Felt chills but no fever that he knows of. Slight cough present at times. Patient Active Problem List   Diagnosis Date Noted  . Chronic kidney disease 08/16/2015  . Supraventricular tachycardia ([place]) 08/16/2015  . Allergic rhinitis 08/04/2015  . Airway hyperreactivity 08/04/2015  . Benign fibroma of prostate 08/04/2015  . CAFL (chronic airflow limitation) ([place]) 08/04/2015  . Essential (primary) hypertension 08/04/2015  . Acid reflux 08/04/2015  . Glaucoma 08/04/2015  . HLD (hyperlipidemia) 08/04/2015  . Mild cognitive disorder 08/04/2015  . Arthritis, degenerative 08/04/2015  . Asymptomatic varicose veins 08/04/2015  . TI (tricuspid incompetence) 11/21/2014  . Paroxysmal atrial fibrillation ([place]) 11/19/2014  . Esophagitis, reflux 11/08/2014  . MI (mitral incompetence) 11/08/2014  . Nonspecific (abnormal) findings on radiological and other examination of gastrointestinal tract 03/27/2012    Past Medical History:  Diagnosis Date  . Arthritis    left ankle  . COPD (chronic obstructive pulmonary disease) ([place])   . GERD (gastroesophageal reflux disease)   . Heart murmur   . Hypertension   . Seasonal allergies     Social History   Social History  . Marital status: Married    Spouse name: N/A  . Number of children: N/A  . Years of education: N/A   Occupational History  . Not on file.   Social History Main Topics  . Smoking status: Former Smoker    Types: Cigars  . Smokeless tobacco: Never Used     Comment:  quit about 15 years ago  . Alcohol use Yes     Comment: occasionally  . Drug use: No  . Sexual activity: Not Currently   Other Topics Concern  . Not on file   Social History Narrative  . No narrative on file    Outpatient Encounter Prescriptions as of 07/16/2016  Medication Sig Note  . albuterol (PROAIR HFA) 108 (90 BASE) MCG/ACT inhaler Inhale into the lungs. 08/04/2015: Medication taken as needed.  Received from: [facility]  . aspirin 81 MG tablet Take 81 mg by mouth daily.   . Brinzolamide-Brimonidine (SIMBRINZA) 1-0.2 % SUSP Apply to eye.   . ciprofloxacin (CILOXAN) 0.3 % ophthalmic solution Place 1 drop into both eyes every 2 (two) hours. Administer 1 drop, every 2 hours, while awake, for 2 days. Then 1 drop, every 4 hours, while awake, for the next 5 days.   . fluticasone (FLONASE) 50 MCG/ACT nasal spray Place 2 sprays into both nostrils daily.   Marland Kitchen HYDROcodone-acetaminophen (NORCO) 10-325 MG tablet Take 1 tablet by mouth 4 (four) times daily as needed.   . metoprolol tartrate (LOPRESSOR) 25 MG tablet Take 25 mg by mouth 2 (two) times daily.   . mometasone (ASMANEX) 220 MCG/INH inhaler Inhale 2 puffs into the lungs daily.   . Multiple Vitamin (MULTI-VITAMINS) TABS Take by mouth. 08/16/2015: Received from: [facility]  . omeprazole (PRILOSEC) 40 MG capsule Take 1 capsule (40 mg total) by mouth daily.   . pravastatin (PRAVACHOL) 20 MG tablet Take by mouth. 08/04/2015: Received from:  [facility]  . terazosin (HYTRIN) 5 MG capsule Take 5 mg by mouth at bedtime.   . timolol (BETIMOL) 0.25 % ophthalmic solution 1-2 drops 2 (two) times daily.    No facility-administered encounter medications on file as of 07/16/2016.     Allergies  Allergen Reactions  . Iodinated Diagnostic Agents Other (See Comments)  . Sulfa Antibiotics Itching and Rash    Allergic reaction was over 30years ago. Patient unsure of exact reaction    Review of  Systems  Constitutional: Positive for chills and malaise/fatigue.  HENT: Positive for congestion.   Eyes: Negative.   Respiratory: Positive for cough.   Cardiovascular: Negative.   Gastrointestinal: Negative.   Musculoskeletal: Negative.   Neurological: Negative.   Endo/Heme/Allergies: Negative.   Psychiatric/Behavioral: Negative.    Objective:  BP (!) 144/62   Pulse 74   Temp 97.8 F (36.6 C)   Resp 16   Wt 156 lb (70.8 kg)   SpO2 98%   BMI 22.07 kg/m   Physical Exam  Constitutional: He is oriented to person, place, and time and well-developed, well-nourished, and in no distress.  HENT:  Head: Normocephalic and atraumatic.  Right Ear: External ear normal.  Left Ear: External ear normal.  Nose: Nose normal.  Mouth/Throat: Oropharynx is clear and moist. No oropharyngeal exudate.  Tender over frontal sinus  Eyes: Conjunctivae are normal. No scleral icterus.  Neck: Neck supple. No thyromegaly present.  Cardiovascular: Normal rate, regular rhythm and normal heart sounds.   Pulmonary/Chest: Effort normal.  Abdominal: Soft.  Lymphadenopathy:    He has no cervical adenopathy.  Neurological: He is alert and oriented to person, place, and time.  Skin: Skin is warm and dry.  Psychiatric: Mood, memory, affect and judgment normal.    Assessment and Plan :  Acute recurrent frontal sinusitis  treat with Augmentin. Follow as needed,. URI Push fluids Conjunctivitis Resolved. I have done the exam and reviewed the chart and it is accurate to the best of my knowledge. Miguel Aschoff M.D. [facility]

## 2016-08-13 DIAGNOSIS — E785 Hyperlipidemia, unspecified: Secondary | ICD-10-CM | POA: Diagnosis not present

## 2016-08-27 ENCOUNTER — Ambulatory Visit (INDEPENDENT_AMBULATORY_CARE_PROVIDER_SITE_OTHER): Payer: PPO | Admitting: Family Medicine

## 2016-08-27 ENCOUNTER — Encounter: Payer: Self-pay | Admitting: Family Medicine

## 2016-08-27 VITALS — BP 158/62 | HR 64 | Temp 97.6°F | Resp 14 | Ht 68.0 in | Wt 163.0 lb

## 2016-08-27 DIAGNOSIS — N135 Crossing vessel and stricture of ureter without hydronephrosis: Secondary | ICD-10-CM | POA: Diagnosis not present

## 2016-08-27 DIAGNOSIS — I471 Supraventricular tachycardia: Secondary | ICD-10-CM

## 2016-08-27 DIAGNOSIS — Z0000 Encounter for general adult medical examination without abnormal findings: Secondary | ICD-10-CM | POA: Diagnosis not present

## 2016-08-27 DIAGNOSIS — E039 Hypothyroidism, unspecified: Secondary | ICD-10-CM | POA: Diagnosis not present

## 2016-08-27 NOTE — Progress Notes (Signed)
Patient: Mark Zirkelbach., Male    DOB: 20-Sep-1933, 82 y.o.   MRN: [id number] Visit Date: 08/27/2016  Today's Provider: Wilhemena Durie, MD   Chief Complaint  Patient presents with  . Medicare Wellness   Subjective:   Mark SHIVER Sr. is a 82 y.o. male who presents today for his Subsequent Annual Wellness Visit. He feels well. He reports exercising some wlaking. He reports he is sleeping well. Overall he feels pretty well.  Immunization History  Administered Date(s) Administered  . Pneumococcal Conjugate-13 08/16/2014  . Pneumococcal Polysaccharide-23 02/15/2015  . Td 01/24/2004, 04/16/2011, 05/25/2016  . Tdap 04/16/2011   Last colonoscopy 01/05/02 normal, except internal hemorrhoids.  Review of Systems  Constitutional: Negative.   HENT: Negative.   Eyes: Negative.   Respiratory: Negative.   Cardiovascular: Negative.   Gastrointestinal: Negative.   Endocrine: Negative.   Genitourinary: Negative.   Musculoskeletal: Negative.   Skin: Negative.   Allergic/Immunologic: Positive for environmental allergies.  Neurological: Negative.   Hematological: Negative.   Psychiatric/Behavioral: Negative.     Patient Active Problem List   Diagnosis Date Noted  . Chronic kidney disease 08/16/2015  . Supraventricular tachycardia ([place]) 08/16/2015  . Allergic rhinitis 08/04/2015  . Airway hyperreactivity 08/04/2015  . Benign fibroma of prostate 08/04/2015  . CAFL (chronic airflow limitation) ([place]) 08/04/2015  . Essential (primary) hypertension 08/04/2015  . Acid reflux 08/04/2015  . Glaucoma 08/04/2015  . HLD (hyperlipidemia) 08/04/2015  . Mild cognitive disorder 08/04/2015  . Arthritis, degenerative 08/04/2015  . Asymptomatic varicose veins 08/04/2015  . TI (tricuspid incompetence) 11/21/2014  . Paroxysmal atrial fibrillation ([place]) 11/19/2014  . Esophagitis, reflux 11/08/2014  . MI (mitral incompetence) 11/08/2014  . Nonspecific (abnormal) findings on radiological and  other examination of gastrointestinal tract 03/27/2012    Social History   Social History  . Marital status: Married    Spouse name: N/A  . Number of children: N/A  . Years of education: N/A   Occupational History  . Not on file.   Social History Main Topics  . Smoking status: Former Smoker    Types: Cigars  . Smokeless tobacco: Never Used     Comment: quit about 15 years ago  . Alcohol use Yes     Comment: occasionally  . Drug use: No  . Sexual activity: Not Currently   Other Topics Concern  . Not on file   Social History Narrative  . No narrative on file    Past Surgical History:  Procedure Laterality Date  . EUS  03/27/2012   Procedure: UPPER ENDOSCOPIC ULTRASOUND (EUS) LINEAR;  Surgeon: Milus Banister, MD;  Location: [facility] ENDOSCOPY;  Service: Endoscopy;  Laterality: N/A;  [place] EUS  . FINE NEEDLE ASPIRATION  03/27/2012   Procedure: FINE NEEDLE ASPIRATION (FNA) LINEAR;  Surgeon: Milus Banister, MD;  Location: [facility] ENDOSCOPY;  Service: Endoscopy;  Laterality: N/A;  . HERNIA REPAIR    . ORIF ANKLE FRACTURE      His family history includes Arthritis in his mother; Breast cancer in his sister and sister; Heart disease in his mother; Hypertension in his mother.     Outpatient Encounter Prescriptions as of 08/27/2016  Medication Sig Note  . aspirin 81 MG tablet Take 81 mg by mouth daily.   . Brinzolamide-Brimonidine (SIMBRINZA) 1-0.2 % SUSP Apply to eye.   . fluticasone (FLONASE) 50 MCG/ACT nasal spray Place 2 sprays into both nostrils daily.   Marland Kitchen HYDROcodone-acetaminophen (NORCO) 10-325 MG tablet Take 1 tablet by mouth  4 (four) times daily as needed.   . metoprolol tartrate (LOPRESSOR) 25 MG tablet Take 25 mg by mouth 2 (two) times daily.   . mometasone (ASMANEX) 220 MCG/INH inhaler Inhale 2 puffs into the lungs daily.   . Multiple Vitamin (MULTI-VITAMINS) TABS Take by mouth. 08/16/2015: Received from: [facility]  . omeprazole (PRILOSEC) 40 MG capsule  Take 1 capsule (40 mg total) by mouth daily.   . pravastatin (PRAVACHOL) 20 MG tablet Take by mouth. 08/04/2015: Received from: [facility]  . terazosin (HYTRIN) 5 MG capsule Take 5 mg by mouth at bedtime.   . timolol (BETIMOL) 0.25 % ophthalmic solution 1-2 drops 2 (two) times daily.   . [DISCONTINUED] ciprofloxacin (CILOXAN) 0.3 % ophthalmic solution Place 1 drop into both eyes every 2 (two) hours. Administer 1 drop, every 2 hours, while awake, for 2 days. Then 1 drop, every 4 hours, while awake, for the next 5 days.   . [DISCONTINUED] albuterol (PROAIR HFA) 108 (90 BASE) MCG/ACT inhaler Inhale into the lungs. 08/04/2015: Medication taken as needed.  Received from: [facility]  . [DISCONTINUED] amoxicillin-clavulanate (AUGMENTIN) 875-125 MG tablet Take 1 tablet by mouth 2 (two) times daily.    No facility-administered encounter medications on file as of 08/27/2016.     Allergies  Allergen Reactions  . Iodinated Diagnostic Agents Other (See Comments)  . Sulfa Antibiotics Itching and Rash    Allergic reaction was over 30years ago. Patient unsure of exact reaction    Patient Care Team: Jerrol Banana., MD as PCP - General (Unknown Physician Specialty)   Objective:   Vitals:  Vitals:   08/27/16 0925  BP: (!) 158/62  Pulse: 64  Resp: 14  Temp: 97.6 F (36.4 C)  Weight: 163 lb (73.9 kg)  Height: 5\' 8"  (1.727 m)    Physical Exam  Constitutional: He is oriented to person, place, and time. He appears well-developed and well-nourished.  HENT:  Head: Normocephalic and atraumatic.  Right Ear: External ear normal.  Left Ear: External ear normal.  Nose: Nose normal.  Eyes: Conjunctivae are normal.  Neck: Neck supple. No thyromegaly present.  Cardiovascular: Normal rate, regular rhythm and intact distal pulses.   Murmur heard. 2/6 systolic murmur.  Pulmonary/Chest: Effort normal and breath sounds normal.  Abdominal: Soft.  Musculoskeletal:  He exhibits edema.  1+ LE edema.  Lymphadenopathy:    He has no cervical adenopathy.  Neurological: He is alert and oriented to person, place, and time. No cranial nerve deficit. He exhibits normal muscle tone. Coordination normal.  Skin: Skin is warm and dry.  Psychiatric: He has a normal mood and affect. His behavior is normal. Judgment and thought content normal.    Activities of Daily Living In your present state of health, do you have any difficulty performing the following activities: 08/27/2016  Hearing? Y  Vision? N  Difficulty concentrating or making decisions? N  Walking or climbing stairs? N  Dressing or bathing? N  Doing errands, shopping? N  Some recent data might be hidden    Fall Risk Assessment Fall Risk  08/27/2016 08/16/2015  Falls in the past year? No No     Depression Screen PHQ 2/9 Scores 08/27/2016 08/16/2015 08/16/2015  PHQ - 2 Score 0 0 0    Cognitive Testing - 6-CIT    Year: 0 4 points  Month: 0 3 points  Memorize "Pia Mau, [address] [place]"  Time (within 1 hour:) 0 3 points  Count  backwards from 20: 0 2 4 points  Name months of year: 0 2 4 points  Repeat Address: 0 2 4 6 8 10  points   Total Score: 6/28  Interpretation : Normal (0-7) Abnormal (8-28)    Assessment & Plan:     Annual Wellness Visit  Reviewed patient's Family Medical History Reviewed and updated list of patient's medical providers Assessment of cognitive impairment was done Assessed patient's functional ability Established a written schedule for health screening [place] Completed and Reviewed  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 Hypothyroid OA GERD Aortic Insufficiency H/o SVT PAD  I have done the exam and reviewed the chart and it is accurate to the best of my knowledge. Development worker, community has been used and  any errors in dictation or transcription are unintentional. Miguel Aschoff M.D. [facility]

## 2016-08-28 ENCOUNTER — Telehealth: Payer: Self-pay

## 2016-08-28 LAB — TSH: TSH: 5.04 u[IU]/mL — AB (ref 0.450–4.500)

## 2016-08-28 NOTE — Telephone Encounter (Signed)
Patients wife was advised, she will be picking up copy at front desk. KW

## 2016-08-28 NOTE — Telephone Encounter (Signed)
-----   Message from Jerrol Banana., MD sent at 08/28/2016  8:47 AM EST ----- Thyroid stable.

## 2016-11-09 ENCOUNTER — Encounter: Payer: Self-pay | Admitting: Family Medicine

## 2016-11-09 ENCOUNTER — Ambulatory Visit (INDEPENDENT_AMBULATORY_CARE_PROVIDER_SITE_OTHER): Payer: PPO | Admitting: Family Medicine

## 2016-11-09 VITALS — BP 130/62 | HR 72 | Temp 98.2°F | Resp 16 | Wt 160.0 lb

## 2016-11-09 DIAGNOSIS — J40 Bronchitis, not specified as acute or chronic: Secondary | ICD-10-CM

## 2016-11-09 DIAGNOSIS — R05 Cough: Secondary | ICD-10-CM | POA: Diagnosis not present

## 2016-11-09 DIAGNOSIS — R059 Cough, unspecified: Secondary | ICD-10-CM

## 2016-11-09 LAB — POCT INFLUENZA A/B
Influenza A, POC: NEGATIVE
Influenza B, POC: NEGATIVE

## 2016-11-09 MED ORDER — HYDROCODONE-ACETAMINOPHEN 10-325 MG PO TABS
1.0000 | ORAL_TABLET | Freq: Four times a day (QID) | ORAL | 0 refills | Status: DC | PRN
Start: 2016-11-09 — End: 2018-09-05

## 2016-11-09 MED ORDER — DOXYCYCLINE HYCLATE 100 MG PO TABS: 100.0000 mg | ORAL_TABLET | Freq: Two times a day (BID) | ORAL | 0 refills | Status: DC

## 2016-11-09 NOTE — Progress Notes (Signed)
Patient: Mark Sanchez. Male    DOB: 01/09/1933   83 y.o.   MRN: [id number] Visit Date: 11/09/2016  Today's Provider: Wilhemena Durie, MD   Chief Complaint  Patient presents with  . URI   Subjective:    URI   This is a new problem. The current episode started in the past 7 days. The problem has been gradually worsening. Maximum temperature: temperature was 99.1 this morning. Pt also states he felt like he had some chills. Associated symptoms include congestion, coughing (productive with white sputum), nausea, a plugged ear sensation, rhinorrhea and a sore throat. Pertinent negatives include no abdominal pain, chest pain, diarrhea, ear pain, headaches, neck pain, sinus pain, sneezing, swollen glands, vomiting or wheezing. He has tried antihistamine for the symptoms. The treatment provided no relief.       Allergies  Allergen Reactions  . Iodinated Diagnostic Agents Other (See Comments)  . Sulfa Antibiotics Itching and Rash    Allergic reaction was over 30years ago. Patient unsure of exact reaction     Current Outpatient Prescriptions:  .  aspirin 81 MG tablet, Take 81 mg by mouth daily., Disp: , Rfl:  .  Brinzolamide-Brimonidine (SIMBRINZA) 1-0.2 % SUSP, Apply to eye., Disp: , Rfl:  .  fluticasone (FLONASE) 50 MCG/ACT nasal spray, Place 2 sprays into both nostrils daily., Disp: 48 g, Rfl: 3 .  HYDROcodone-acetaminophen (NORCO) 10-325 MG tablet, Take 1 tablet by mouth 4 (four) times daily as needed., Disp: 100 tablet, Rfl: 0 .  metoprolol tartrate (LOPRESSOR) 25 MG tablet, Take 25 mg by mouth 2 (two) times daily., Disp: , Rfl:  .  mometasone (ASMANEX) 220 MCG/INH inhaler, Inhale 2 puffs into the lungs daily., Disp: , Rfl:  .  Multiple Vitamin (MULTI-VITAMINS) TABS, Take by mouth., Disp: , Rfl:  .  omeprazole (PRILOSEC) 40 MG capsule, Take 1 capsule (40 mg total) by mouth daily., Disp: 90 capsule, Rfl: 3 .  pravastatin (PRAVACHOL) 20 MG tablet, Take by mouth., Disp: ,  Rfl:  .  terazosin (HYTRIN) 5 MG capsule, Take 5 mg by mouth at bedtime., Disp: , Rfl:  .  timolol (BETIMOL) 0.25 % ophthalmic solution, 1-2 drops 2 (two) times daily., Disp: , Rfl:  .  FLUZONE QUADRIVALENT injection, , Disp: , Rfl:   Review of Systems  Constitutional: Positive for chills.  HENT: Positive for congestion, rhinorrhea and sore throat. Negative for ear pain, sinus pain and sneezing.   Respiratory: Positive for cough (productive with white sputum). Negative for wheezing.   Cardiovascular: Negative for chest pain.  Gastrointestinal: Positive for nausea. Negative for abdominal pain, diarrhea and vomiting.  Endocrine: Negative.   Genitourinary: Negative.   Musculoskeletal: Negative for neck pain.  Allergic/Immunologic: Negative.   Neurological: Negative for headaches.  Psychiatric/Behavioral: Negative.     Social History  Substance Use Topics  . Smoking status: Former Smoker    Types: Cigars    Quit date: 10/15/1999  . Smokeless tobacco: Never Used     Comment: quit about 15 years ago  . Alcohol use Yes     Comment: occasionally   Objective:   BP 130/62 (BP Location: Left Arm, Patient Position: Sitting, Cuff Size: Normal)   Pulse 72   Temp 98.2 F (36.8 C) (Oral)   Resp 16   Wt 160 lb (72.6 kg)   SpO2 96%   BMI 24.33 kg/m   Physical Exam  Constitutional: He is oriented to person, place, and time. He  appears well-developed and well-nourished.  HENT:  Head: Normocephalic and atraumatic.  Right Ear: External ear normal.  Left Ear: External ear normal.  Nose: Nose normal.  Mouth/Throat: Oropharynx is clear and moist.  Eyes: Conjunctivae are normal. No scleral icterus.  Neck: No thyromegaly present.  Cardiovascular: Normal rate, regular rhythm and normal heart sounds.   Pulmonary/Chest: Effort normal and breath sounds normal.  Abdominal: Soft.  Neurological: He is alert and oriented to person, place, and time.  Skin: Skin is warm and dry.  Psychiatric: He  has a normal mood and affect. His behavior is normal. Judgment and thought content normal.        Assessment & Plan:     1. Cough  - POCT Influenza A/B  2. Bronchitis  - doxycycline (VIBRA-TABS) 100 MG tablet; Take 1 tablet (100 mg total) by mouth 2 (two) times daily.  Dispense: 14 tablet; Refill: 0 3. Chronic pain/OA--posttraumatic Refill Vicodin.  I have done the exam and reviewed the chart and it is accurate to the best of my knowledge. Development worker, community has been used and  any errors in dictation or transcription are unintentional. Miguel Aschoff M.D. [place], MD  [facility]

## 2016-12-10 DIAGNOSIS — L570 Actinic keratosis: Secondary | ICD-10-CM | POA: Diagnosis not present

## 2016-12-10 DIAGNOSIS — X32XXXA Exposure to sunlight, initial encounter: Secondary | ICD-10-CM | POA: Diagnosis not present

## 2016-12-10 DIAGNOSIS — L821 Other seborrheic keratosis: Secondary | ICD-10-CM | POA: Diagnosis not present

## 2016-12-10 DIAGNOSIS — D3615 Benign neoplasm of peripheral nerves and autonomic nervous system of abdomen: Secondary | ICD-10-CM | POA: Diagnosis not present

## 2016-12-10 DIAGNOSIS — B354 Tinea corporis: Secondary | ICD-10-CM | POA: Diagnosis not present

## 2017-02-22 ENCOUNTER — Encounter: Payer: Self-pay | Admitting: Family Medicine

## 2017-02-22 ENCOUNTER — Ambulatory Visit (INDEPENDENT_AMBULATORY_CARE_PROVIDER_SITE_OTHER): Payer: PPO | Admitting: Family Medicine

## 2017-02-22 VITALS — BP 158/80 | HR 63 | Temp 97.6°F | Wt 164.6 lb

## 2017-02-22 DIAGNOSIS — L089 Local infection of the skin and subcutaneous tissue, unspecified: Secondary | ICD-10-CM

## 2017-02-22 DIAGNOSIS — W57XXXA Bitten or stung by nonvenomous insect and other nonvenomous arthropods, initial encounter: Secondary | ICD-10-CM

## 2017-02-22 DIAGNOSIS — L539 Erythematous condition, unspecified: Secondary | ICD-10-CM | POA: Diagnosis not present

## 2017-02-22 DIAGNOSIS — W57XXXD Bitten or stung by nonvenomous insect and other nonvenomous arthropods, subsequent encounter: Secondary | ICD-10-CM

## 2017-02-22 DIAGNOSIS — R600 Localized edema: Secondary | ICD-10-CM | POA: Diagnosis not present

## 2017-02-22 MED ORDER — DOXYCYCLINE HYCLATE 100 MG PO TABS: 100.0000 mg | ORAL_TABLET | Freq: Two times a day (BID) | ORAL | 0 refills | Status: DC

## 2017-02-22 NOTE — Patient Instructions (Signed)
Tick Bite Information, Adult Ticks are insects that draw blood for food. Most ticks live in shrubs and grassy areas. They climb onto people and animals that brush against the leaves and grasses that they rest on. Then they bite, attaching themselves to the skin. Most ticks are harmless, but some ticks carry germs that can spread to a person through a bite and cause a disease. To reduce your risk of getting a disease from a tick bite, it is important to take steps to prevent tick bites. It is also important to check for ticks after being outdoors. If you find that a tick has attached to you, watch for symptoms of disease. How can I prevent tick bites? Take these steps to help prevent tick bites when you are outdoors in an area where ticks are found:  Use insect repellent that has DEET (20% or higher), picaridin, or IR3535 in it. Use it on:  Skin that is showing.  The top of your boots.  Your pant legs.  Your sleeve cuffs.  For repellent products that contain permethrin, follow product instructions. Use these products on:  Clothing.  Gear.  Boots.  Tents.  Wear protective clothing. Long sleeves and long pants offer the best protection from ticks.  Wear light-colored clothing so you can see ticks more easily.  Tuck your pant legs into your socks.  If you go walking on a trail, stay in the middle of the trail so your skin, hair, and clothing do not touch the bushes.  Avoid walking through areas with long grass.  Check for ticks on your clothing, hair, and skin often while you are outside, and check again before you go inside. Make sure to check the places that ticks attach themselves most often. These places include the scalp, neck, armpits, waist, groin, and joint areas. Ticks that carry a disease called Lyme disease have to be attached to the skin for 24-48 hours. Checking for ticks every day will lessen your risk of this and other diseases.  When you come indoors, wash your  clothes and take a shower or a bath right away. Dry your clothes in a dryer on high heat for at least 60 minutes. This will kill any ticks in your clothes. What is the proper way to remove a tick? If you find a tick on your body, remove it as soon as possible. Removing a tick sooner rather than later can prevent germs from passing from the tick to your body. To remove a tick that is crawling on your skin but has not bitten:  Go outdoors and brush the tick off.  Remove the tick with tape or a lint roller. To remove a tick that is attached to your skin:  Wash your hands.  If you have latex gloves, put them on.  Use tweezers, curved forceps, or a tick-removal tool to gently grasp the tick as close to your skin and the tick's head as possible.  Gently pull with steady, upward pressure until the tick lets go. When removing the tick:  Take care to keep the tick's head attached to its body.  Do not twist or jerk the tick. This can make the tick's head or mouth break off.  Do not squeeze or crush the tick's body. This could force disease-carrying fluids from the tick into your body. Do not try to remove a tick with heat, alcohol, petroleum jelly, or fingernail polish. Using these methods can cause the tick to salivate and regurgitate into your  bloodstream, increasing your risk of getting a disease. What should I do after removing a tick?  Clean the bite area with soap and water, rubbing alcohol, or an iodine scrub.  If an antiseptic cream or ointment is available, apply a small amount to the bite site.  Wash and disinfect any instruments that you used to remove the tick. How should I dispose of a tick? To dispose of a live tick, use one of these methods:  Place it in rubbing alcohol.  Place it in a sealed bag or container.  Wrap it tightly in tape.  Flush it down the toilet. Contact a health care provider if:  You have symptoms of a disease after a tick bite. Symptoms of a  tick-borne disease can occur from moments after the tick bites to up to 30 days after a tick is removed. Symptoms include:  Muscle, joint, or bone pain.  Difficulty walking or moving your legs.  Numbness in the legs.  Paralysis.  Red rash around the tick bite area that is shaped like a target or a "bull's-eye."  Redness and swelling in the area of the tick bite.  Fever.  Repeated vomiting.  Diarrhea.  Weight loss.  Tender, swollen lymph glands.  Shortness of breath.  Cough.  Pain in the abdomen.  Headache.  Abnormal tiredness.  A change in your level of consciousness.  Confusion. Get help right away if:  You are not able to remove a tick.  A part of a tick breaks off and gets stuck in your skin.  Your symptoms get worse. Summary  Ticks may carry germs that can spread to a person through a bite and cause disease.  Wear protective clothing and use insect repellent to prevent tick bites. Follow product instructions.  If you find a tick on your body, remove it as soon as possible. If the tick is attached, do not try to remove with heat, alcohol, petroleum jelly, or fingernail polish.  Remove the attached tick using tweezers, curved forceps, or a tick-removal tool. Gently pull with steady, upward pressure until the tick lets go. Do not twist or jerk the tick. Do not squeeze or crush the tick's body.  If you have symptoms after being bitten by a tick, contact a health care provider. This information is not intended to replace advice given to you by your health care provider. Make sure you discuss any questions you have with your health care provider. Document Released: 09/28/2000 Document Revised: 07/13/2016 Document Reviewed: 07/13/2016 Elsevier Interactive Patient Education  2017 [employer].

## 2017-02-22 NOTE — Progress Notes (Signed)
Patient: Mark Sanchez. Male    DOB: 1932-10-25   83 y.o.   MRN: [id number] Visit Date: 02/22/2017  Today's Provider: Vernie Murders, PA   Chief Complaint  Patient presents with  . Rash   Subjective:    Rash  This is a new problem. Episode onset: Monday. The problem has been gradually improving since onset. Location: left interior ankle. The rash is characterized by itchiness, redness and swelling. Associated with: possibly a tick bite. Past treatments include nothing.   Patient Active Problem List   Diagnosis Date Noted  . Chronic kidney disease 08/16/2015  . Supraventricular tachycardia ([facility]) 08/16/2015  . Allergic rhinitis 08/04/2015  . Airway hyperreactivity 08/04/2015  . Benign fibroma of prostate 08/04/2015  . CAFL (chronic airflow limitation) ([place]) 08/04/2015  . Essential (primary) hypertension 08/04/2015  . Acid reflux 08/04/2015  . Glaucoma 08/04/2015  . HLD (hyperlipidemia) 08/04/2015  . Mild cognitive disorder 08/04/2015  . Arthritis, degenerative 08/04/2015  . Asymptomatic varicose veins 08/04/2015  . TI (tricuspid incompetence) 11/21/2014  . Paroxysmal atrial fibrillation ([place]) 11/19/2014  . Esophagitis, reflux 11/08/2014  . MI (mitral incompetence) 11/08/2014  . Nonspecific (abnormal) findings on radiological and other examination of gastrointestinal tract 03/27/2012   Past Surgical History:  Procedure Laterality Date  . EUS  03/27/2012   Procedure: UPPER ENDOSCOPIC ULTRASOUND (EUS) LINEAR;  Surgeon: Milus Banister, MD;  Location: [facility];  Service: Endoscopy;  Laterality: N/A;  [place] EUS  . FINE NEEDLE ASPIRATION  03/27/2012   Procedure: FINE NEEDLE ASPIRATION (FNA) LINEAR;  Surgeon: Milus Banister, MD;  Location: [facility];  Service: Endoscopy;  Laterality: N/A;  . HERNIA REPAIR    . ORIF ANKLE FRACTURE     Family History  Problem Relation Age of Onset  . Heart disease Mother   . Hypertension Mother   . Arthritis Mother   . Breast  cancer Sister   . Breast cancer Sister    Allergies  Allergen Reactions  . Iodinated Diagnostic Agents Other (See Comments)  . Sulfa Antibiotics Itching and Rash    Allergic reaction was over 30years ago. Patient unsure of exact reaction     Previous Medications   ASPIRIN 81 MG TABLET    Take 81 mg by mouth daily.   BRINZOLAMIDE-BRIMONIDINE (SIMBRINZA) 1-0.2 % SUSP    Apply to eye.   FLUTICASONE (FLONASE) 50 MCG/ACT NASAL SPRAY    Place 2 sprays into both nostrils daily.   FLUZONE QUADRIVALENT INJECTION       HYDROCODONE-ACETAMINOPHEN (NORCO) 10-325 MG TABLET    Take 1 tablet by mouth 4 (four) times daily as needed.   METOPROLOL TARTRATE (LOPRESSOR) 25 MG TABLET    Take 25 mg by mouth 2 (two) times daily.   MOMETASONE (ASMANEX) 220 MCG/INH INHALER    Inhale 2 puffs into the lungs daily.   MULTIPLE VITAMIN (MULTI-VITAMINS) TABS    Take by mouth.   OMEPRAZOLE (PRILOSEC) 40 MG CAPSULE    Take 1 capsule (40 mg total) by mouth daily.   PRAVASTATIN (PRAVACHOL) 20 MG TABLET    Take by mouth.   TERAZOSIN (HYTRIN) 5 MG CAPSULE    Take 5 mg by mouth at bedtime.   TIMOLOL (BETIMOL) 0.25 % OPHTHALMIC SOLUTION    1-2 drops 2 (two) times daily.    Review of Systems  Constitutional: Negative.   Respiratory: Negative.   Cardiovascular: Negative.   Skin: Positive for rash.       Possible tick bite,redness, swelling and  itchiness      Social History  Substance Use Topics  . Smoking status: Former Smoker    Types: Cigars    Quit date: 10/15/1999  . Smokeless tobacco: Never Used     Comment: quit about 15 years ago  . Alcohol use Yes     Comment: occasionally   Objective:   BP (!) 158/80 (BP Location: Right Arm, Patient Position: Sitting, Cuff Size: Normal)   Pulse 63   Temp 97.6 F (36.4 C) (Oral)   Wt 164 lb 9.6 oz (74.7 kg)   SpO2 99%   BMI 25.03 kg/m   Physical Exam  Constitutional: He is oriented to person, place, and time. He appears well-developed and well-nourished. No  distress.  HENT:  Head: Normocephalic and atraumatic.  Right Ear: Hearing normal.  Left Ear: Hearing normal.  Nose: Nose normal.  Eyes: Conjunctivae and lids are normal. Right eye exhibits no discharge. Left eye exhibits no discharge. No scleral icterus.  Cardiovascular: Normal rate and regular rhythm.   Pulmonary/Chest: Effort normal and breath sounds normal. No respiratory distress.  Abdominal: Bowel sounds are normal.  Musculoskeletal: Normal range of motion.  Neurological: He is alert and oriented to person, place, and time.  Skin: Skin is intact. No lesion and no rash noted.  1 cm lesion with surrounding erythema on the left medial ankMedia Information     Document Information   Photos  02/22/2017 8:43 AM Attached To: Office Visit on 02/22/17 with Ayianna Darnold, Vickki Muff, PA Source Information   Mallery Harshman, Vickki Muff, PA  [facility] [employer] [facility]. No local lymphadenopathy.  Psychiatric: He has a normal mood and affect. His speech is normal and behavior is normal. Thought content normal.   Media Information     Document Information   Photos    02/22/2017 8:43 AM  Attached To:  Office Visit on 02/22/17 with Kelsye Loomer, Vickki Muff, PA  Source Information   Malaka Ruffner, Vickki Muff, [place]  [facility]      Assessment & Plan:     1. Infected tick bite, initial encounter Noticed a itchy black spot on the left ankle on 02-18-17 that progressed to a red area. He and his wife dug out a "deer tick" and has more erythema around the site. No longer having any itching and no fever. Will treat with Doxycycline and recheck if persistent or changes in the next 5-7 days. - doxycycline (VIBRA-TABS) 100 MG tablet; Take 1 tablet (100 mg total) by mouth 2 (two) times daily.  Dispense: 20 tablet; Refill: 0

## 2017-03-01 ENCOUNTER — Ambulatory Visit (INDEPENDENT_AMBULATORY_CARE_PROVIDER_SITE_OTHER): Payer: PPO | Admitting: Family Medicine

## 2017-03-01 ENCOUNTER — Encounter: Payer: Self-pay | Admitting: Family Medicine

## 2017-03-01 VITALS — BP 118/64 | HR 58 | Temp 97.7°F | Wt 165.2 lb

## 2017-03-01 DIAGNOSIS — L089 Local infection of the skin and subcutaneous tissue, unspecified: Secondary | ICD-10-CM | POA: Diagnosis not present

## 2017-03-01 DIAGNOSIS — R600 Localized edema: Secondary | ICD-10-CM | POA: Diagnosis not present

## 2017-03-01 DIAGNOSIS — L539 Erythematous condition, unspecified: Secondary | ICD-10-CM

## 2017-03-01 DIAGNOSIS — W57XXXD Bitten or stung by nonvenomous insect and other nonvenomous arthropods, subsequent encounter: Secondary | ICD-10-CM

## 2017-03-01 MED ORDER — DOXYCYCLINE HYCLATE 100 MG PO TABS: 100.0000 mg | ORAL_TABLET | Freq: Two times a day (BID) | ORAL | 0 refills | Status: DC

## 2017-03-01 NOTE — Progress Notes (Signed)
Patient: Mark Sanchez. Male    DOB: 1933/02/16   83 y.o.   MRN: [id number] Visit Date: 03/01/2017  Today's Provider: Vernie Murders, PA   Chief Complaint  Patient presents with  . Follow-up    tick bite    Subjective:    HPI  Tick Bite Follow Up:  Patient is here to follow up from [place] on 02/22/2017. Symptoms include a black spot on the left ankle and redness which has worsen since OV. Patient started Doxycycline on 02/22/2017 which is he currently still taking. He is also using anti-itch cream recommended by pharmacist which is helping some.   Patient Active Problem List   Diagnosis Date Noted  . Chronic kidney disease 08/16/2015  . Supraventricular tachycardia ([place]) 08/16/2015  . Allergic rhinitis 08/04/2015  . Airway hyperreactivity 08/04/2015  . Benign fibroma of prostate 08/04/2015  . CAFL (chronic airflow limitation) ([place]) 08/04/2015  . Essential (primary) hypertension 08/04/2015  . Acid reflux 08/04/2015  . Glaucoma 08/04/2015  . HLD (hyperlipidemia) 08/04/2015  . Mild cognitive disorder 08/04/2015  . Arthritis, degenerative 08/04/2015  . Asymptomatic varicose veins 08/04/2015  . TI (tricuspid incompetence) 11/21/2014  . Paroxysmal atrial fibrillation ([place]) 11/19/2014  . Esophagitis, reflux 11/08/2014  . MI (mitral incompetence) 11/08/2014  . Nonspecific (abnormal) findings on radiological and other examination of gastrointestinal tract 03/27/2012   Past Surgical History:  Procedure Laterality Date  . EUS  03/27/2012   Procedure: UPPER ENDOSCOPIC ULTRASOUND (EUS) LINEAR;  Surgeon: Milus Banister, MD;  Location: [facility];  Service: Endoscopy;  Laterality: N/A;  [place] EUS  . FINE NEEDLE ASPIRATION  03/27/2012   Procedure: FINE NEEDLE ASPIRATION (FNA) LINEAR;  Surgeon: Milus Banister, MD;  Location: [facility];  Service: Endoscopy;  Laterality: N/A;  . HERNIA REPAIR    . ORIF ANKLE FRACTURE     Family History  Problem Relation Age of Onset  . Heart  disease Mother   . Hypertension Mother   . Arthritis Mother   . Breast cancer Sister   . Breast cancer Sister    Allergies  Allergen Reactions  . Iodinated Diagnostic Agents Other (See Comments)  . Sulfa Antibiotics Itching and Rash    Allergic reaction was over 30years ago. Patient unsure of exact reaction     Previous Medications   ASPIRIN 81 MG TABLET    Take 81 mg by mouth daily.   BRINZOLAMIDE-BRIMONIDINE (SIMBRINZA) 1-0.2 % SUSP    Apply to eye.   DOXYCYCLINE (VIBRA-TABS) 100 MG TABLET    Take 1 tablet (100 mg total) by mouth 2 (two) times daily.   FLUTICASONE (FLONASE) 50 MCG/ACT NASAL SPRAY    Place 2 sprays into both nostrils daily.   FLUZONE QUADRIVALENT INJECTION       HYDROCODONE-ACETAMINOPHEN (NORCO) 10-325 MG TABLET    Take 1 tablet by mouth 4 (four) times daily as needed.   METOPROLOL TARTRATE (LOPRESSOR) 25 MG TABLET    Take 25 mg by mouth 2 (two) times daily.   MOMETASONE (ASMANEX) 220 MCG/INH INHALER    Inhale 2 puffs into the lungs daily.   MULTIPLE VITAMIN (MULTI-VITAMINS) TABS    Take by mouth.   OMEPRAZOLE (PRILOSEC) 40 MG CAPSULE    Take 1 capsule (40 mg total) by mouth daily.   PRAVASTATIN (PRAVACHOL) 20 MG TABLET    Take by mouth.   TERAZOSIN (HYTRIN) 5 MG CAPSULE    Take 5 mg by mouth at bedtime.   TIMOLOL (BETIMOL) 0.25 % OPHTHALMIC  SOLUTION    1-2 drops 2 (two) times daily.    Review of Systems  Constitutional: Negative.   Respiratory: Negative.   Cardiovascular: Negative.   Skin: Positive for color change.       Tick bite and redness     Social History  Substance Use Topics  . Smoking status: Former Smoker    Types: Cigars    Quit date: 10/15/1999  . Smokeless tobacco: Never Used     Comment: quit about 15 years ago  . Alcohol use Yes     Comment: occasionally   Objective:   BP 118/64 (BP Location: Right Arm, Patient Position: Sitting, Cuff Size: Normal)   Pulse (!) 58   Temp 97.7 F (36.5 C) (Oral)   Wt 165 lb 3.2 oz (74.9 kg)   SpO2  98%   BMI 25.12 kg/m   Physical Exam  Constitutional: He is oriented to person, place, and time. He appears well-developed and well-nourished. No distress.  HENT:  Head: Normocephalic and atraumatic.  Right Ear: Hearing normal.  Left Ear: Hearing normal.  Nose: Nose normal.  Eyes: Conjunctivae and lids are normal. Right eye exhibits no discharge. Left eye exhibits no discharge. No scleral icterus.  Pulmonary/Chest: Effort normal. No respiratory distress.  Musculoskeletal: Normal range of motion.  Neurological: He is alert and oriented to person, place, and time.  Skin: Skin is intact. Rash noted. No lesion noted.  Petechial rash 3 cm around scab of tick bite on the left medial lower leg near the ankle. No itching or drainage.  Psychiatric: He has a normal mood and affect. His speech is normal and behavior is normal. Thought content normal.      Assessment & Plan:     1. Infected tick bite, subsequent encounter Slow healing with petechial rash around bite site. No fever or itching. History of peripheral venous insufficiency with tan hyperpigmentation around the left lower leg secondary to past trauma/fracture requiring surgery. Refilled doxycycline, recommend hot Epsom saltwater soaks and Hydrocortisone cream to redness. Recheck prn. - doxycycline (VIBRA-TABS) 100 MG tablet; Take 1 tablet (100 mg total) by mouth 2 (two) times daily.  Dispense: 20 tablet; Refill: 0

## 2017-03-18 DIAGNOSIS — K210 Gastro-esophageal reflux disease with esophagitis: Secondary | ICD-10-CM | POA: Diagnosis not present

## 2017-03-18 DIAGNOSIS — N182 Chronic kidney disease, stage 2 (mild): Secondary | ICD-10-CM | POA: Diagnosis not present

## 2017-03-18 DIAGNOSIS — I351 Nonrheumatic aortic (valve) insufficiency: Secondary | ICD-10-CM | POA: Diagnosis not present

## 2017-03-18 DIAGNOSIS — I071 Rheumatic tricuspid insufficiency: Secondary | ICD-10-CM | POA: Diagnosis not present

## 2017-03-18 DIAGNOSIS — I10 Essential (primary) hypertension: Secondary | ICD-10-CM | POA: Diagnosis not present

## 2017-03-18 DIAGNOSIS — I480 Paroxysmal atrial fibrillation: Secondary | ICD-10-CM | POA: Diagnosis not present

## 2017-03-18 DIAGNOSIS — I340 Nonrheumatic mitral (valve) insufficiency: Secondary | ICD-10-CM | POA: Diagnosis not present

## 2017-05-14 ENCOUNTER — Other Ambulatory Visit: Payer: Self-pay | Admitting: Family Medicine

## 2017-07-24 ENCOUNTER — Other Ambulatory Visit: Payer: Self-pay | Admitting: Family Medicine

## 2017-07-25 NOTE — Telephone Encounter (Signed)
Pharmacy requesting refills. Thanks!  

## 2017-08-09 DIAGNOSIS — E782 Mixed hyperlipidemia: Secondary | ICD-10-CM | POA: Diagnosis not present

## 2017-08-09 LAB — HEPATIC FUNCTION PANEL
ALK PHOS: 58 (ref 25–125)
ALT: 9 — AB (ref 10–40)
AST: 13 — AB (ref 14–40)
BILIRUBIN, TOTAL: 0.7

## 2017-08-09 LAB — LIPID PANEL
Cholesterol: 131 (ref 0–200)
HDL: 41 (ref 35–70)
LDL Cholesterol: 77
Triglycerides: 65 (ref 40–160)

## 2017-08-23 ENCOUNTER — Ambulatory Visit (INDEPENDENT_AMBULATORY_CARE_PROVIDER_SITE_OTHER): Payer: PPO

## 2017-08-23 VITALS — BP 140/62 | HR 64 | Temp 97.9°F | Ht 68.0 in | Wt 165.0 lb

## 2017-08-23 DIAGNOSIS — Z0000 Encounter for general adult medical examination without abnormal findings: Secondary | ICD-10-CM | POA: Diagnosis not present

## 2017-08-23 NOTE — Patient Instructions (Addendum)
[name] , Thank you for taking time to come for your Medicare Wellness Visit. I appreciate your ongoing commitment to your health goals. Please review the following plan we discussed and let me know if I can assist you in the future.   Screening recommendations/referrals: Colonoscopy: no longer required Recommended yearly ophthalmology/optometry visit for glaucoma screening and checkup Recommended yearly dental visit for hygiene and checkup  Vaccinations: Influenza vaccine: completed Pneumococcal vaccine: completed series Tdap vaccine: up to date Shingles vaccine: unknown, declined today  Advanced directives: Please bring a copy of your POA (Power of Attorney) and/or Living Will to your next appointment.   Conditions/risks identified: Recommend drinking 6-8 glasses of water daily.   Next appointment: 08/30/17 @ 9:00 AM  Preventive Care 65 Years and Older, Male Preventive care refers to lifestyle choices and visits with your health care provider that can promote health and wellness. What does preventive care include?  A yearly physical exam. This is also called an annual well check.  Dental exams once or twice a year.  Routine eye exams. Ask your health care provider how often you should have your eyes checked.  Personal lifestyle choices, including:  Daily care of your teeth and gums.  Regular physical activity.  Eating a healthy diet.  Avoiding tobacco and drug use.  Limiting alcohol use.  Practicing safe sex.  Taking low doses of aspirin every day.  Taking vitamin and mineral supplements as recommended by your health care provider. What happens during an annual well check? The services and screenings done by your health care provider during your annual well check will depend on your age, overall health, lifestyle risk factors, and family history of disease. Counseling  Your health care provider may ask you questions about your:  Alcohol use.  Tobacco  use.  Drug use.  Emotional well-being.  Home and relationship well-being.  Sexual activity.  Eating habits.  History of falls.  Memory and ability to understand (cognition).  Work and work Statistician. Screening  You may have the following tests or measurements:  Height, weight, and BMI.  Blood pressure.  Lipid and cholesterol levels. These may be checked every 5 years, or more frequently if you are over 39 years old.  Skin check.  Lung cancer screening. You may have this screening every year starting at age 74 if you have a 30-pack-year history of smoking and currently smoke or have quit within the past 15 years.  Fecal occult blood test (FOBT) of the stool. You may have this test every year starting at age 49.  Flexible sigmoidoscopy or colonoscopy. You may have a sigmoidoscopy every 5 years or a colonoscopy every 10 years starting at age 67.  Prostate cancer screening. Recommendations will vary depending on your family history and other risks.  Hepatitis C blood test.  Hepatitis B blood test.  Sexually transmitted disease (STD) testing.  Diabetes screening. This is done by checking your blood sugar (glucose) after you have not eaten for a while (fasting). You may have this done every 1-3 years.  Abdominal aortic aneurysm (AAA) screening. You may need this if you are a current or former smoker.  Osteoporosis. You may be screened starting at age 58 if you are at high risk. Talk with your health care provider about your test results, treatment options, and if necessary, the need for more tests. Vaccines  Your health care provider may recommend certain vaccines, such as:  Influenza vaccine. This is recommended every year.  Tetanus, diphtheria, and  acellular pertussis (Tdap, Td) vaccine. You may need a Td booster every 10 years.  Zoster vaccine. You may need this after age 6.  Pneumococcal 13-valent conjugate (PCV13) vaccine. One dose is recommended after age  26.  Pneumococcal polysaccharide (PPSV23) vaccine. One dose is recommended after age 52. Talk to your health care provider about which screenings and vaccines you need and how often you need them. This information is not intended to replace advice given to you by your health care provider. Make sure you discuss any questions you have with your health care provider. Document Released: 10/28/2015 Document Revised: 06/20/2016 Document Reviewed: 08/02/2015 Elsevier Interactive Patient Education  2017 [place] Prevention in the Home Falls can cause injuries. They can happen to people of all ages. There are many things you can do to make your home safe and to help prevent falls. What can I do on the outside of my home?  Regularly fix the edges of walkways and driveways and fix any cracks.  Remove anything that might make you trip as you walk through a door, such as a raised step or threshold.  Trim any bushes or trees on the path to your home.  Use bright outdoor lighting.  Clear any walking paths of anything that might make someone trip, such as rocks or tools.  Regularly check to see if handrails are loose or broken. Make sure that both sides of any steps have handrails.  Any raised decks and porches should have guardrails on the edges.  Have any leaves, snow, or ice cleared regularly.  Use sand or salt on walking paths during winter.  Clean up any spills in your garage right away. This includes oil or grease spills. What can I do in the bathroom?  Use night lights.  Install grab bars by the toilet and in the tub and shower. Do not use towel bars as grab bars.  Use non-skid mats or decals in the tub or shower.  If you need to sit down in the shower, use a plastic, non-slip stool.  Keep the floor dry. Clean up any water that spills on the floor as soon as it happens.  Remove soap buildup in the tub or shower regularly.  Attach bath mats securely with double-sided  non-slip rug tape.  Do not have throw rugs and other things on the floor that can make you trip. What can I do in the bedroom?  Use night lights.  Make sure that you have a light by your bed that is easy to reach.  Do not use any sheets or blankets that are too big for your bed. They should not hang down onto the floor.  Have a firm chair that has side arms. You can use this for support while you get dressed.  Do not have throw rugs and other things on the floor that can make you trip. What can I do in the kitchen?  Clean up any spills right away.  Avoid walking on wet floors.  Keep items that you use a lot in easy-to-reach places.  If you need to reach something above you, use a strong step stool that has a grab bar.  Keep electrical cords out of the way.  Do not use floor polish or wax that makes floors slippery. If you must use wax, use non-skid floor wax.  Do not have throw rugs and other things on the floor that can make you trip. What can I do with my stairs?  Do not leave any items on the stairs.  Make sure that there are handrails on both sides of the stairs and use them. Fix handrails that are broken or loose. Make sure that handrails are as long as the stairways.  Check any carpeting to make sure that it is firmly attached to the stairs. Fix any carpet that is loose or worn.  Avoid having throw rugs at the top or bottom of the stairs. If you do have throw rugs, attach them to the floor with carpet tape.  Make sure that you have a light switch at the top of the stairs and the bottom of the stairs. If you do not have them, ask someone to add them for you. What else can I do to help prevent falls?  Wear shoes that:  Do not have high heels.  Have rubber bottoms.  Are comfortable and fit you well.  Are closed at the toe. Do not wear sandals.  If you use a stepladder:  Make sure that it is fully opened. Do not climb a closed stepladder.  Make sure that both  sides of the stepladder are locked into place.  Ask someone to hold it for you, if possible.  Clearly mark and make sure that you can see:  Any grab bars or handrails.  First and last steps.  Where the edge of each step is.  Use tools that help you move around (mobility aids) if they are needed. These include:  Canes.  Walkers.  Scooters.  Crutches.  Turn on the lights when you go into a dark area. Replace any light bulbs as soon as they burn out.  Set up your furniture so you have a clear path. Avoid moving your furniture around.  If any of your floors are uneven, fix them.  If there are any pets around you, be aware of where they are.  Review your medicines with your doctor. Some medicines can make you feel dizzy. This can increase your chance of falling. Ask your doctor what other things that you can do to help prevent falls. This information is not intended to replace advice given to you by your health care provider. Make sure you discuss any questions you have with your health care provider. Document Released: 07/28/2009 Document Revised: 03/08/2016 Document Reviewed: 11/05/2014 Elsevier Interactive Patient Education  2017 [employer].

## 2017-08-23 NOTE — Progress Notes (Signed)
Subjective:   Mark DEROUSSE Sr. is a 83 y.o. male who presents for Medicare Annual/Subsequent preventive examination.  Review of Systems:  N/A  Cardiac Risk Factors include: advanced age (>40men, >19 women);dyslipidemia;hypertension;male gender     Objective:    Vitals: BP 140/62 (BP Location: Left Arm)   Pulse 64   Temp 97.9 F (36.6 C) (Oral)   Ht 5\' 8"  (1.727 m)   Wt 165 lb (74.8 kg)   BMI 25.09 kg/m   Body mass index is 25.09 kg/m.  Tobacco Social History   Tobacco Use  Smoking Status Former Smoker  . Types: Cigars  . Last attempt to quit: 10/15/1999  . Years since quitting: 17.8  Smokeless Tobacco Never Used  Tobacco Comment   quit about 15 years ago     Counseling given: Not Answered Comment: quit about 15 years ago   Past Medical History:  Diagnosis Date  . Arthritis    left ankle  . Brain tumor (benign) ([place])   . COPD (chronic obstructive pulmonary disease) ([place])   . GERD (gastroesophageal reflux disease)   . Heart murmur   . Hypertension   . Seasonal allergies    Past Surgical History:  Procedure Laterality Date  . HERNIA REPAIR    . ORIF ANKLE FRACTURE     Family History  Problem Relation Age of Onset  . Heart disease Mother   . Hypertension Mother   . Arthritis Mother   . Breast cancer Sister   . Breast cancer Sister    Social History   Substance and Sexual Activity  Sexual Activity Not Currently    Outpatient Encounter Medications as of 08/23/2017  Medication Sig  . aspirin 81 MG tablet Take 81 mg by mouth daily.  . Brinzolamide-Brimonidine (SIMBRINZA) 1-0.2 % SUSP Apply 3 drops daily to eye.   . fluticasone (FLONASE) 50 MCG/ACT nasal spray TAKE TWO PUFFS EACH NOSTRIL DAILY.  Marland Kitchen FLUZONE QUADRIVALENT injection   . HYDROcodone-acetaminophen (NORCO) 10-325 MG tablet Take 1 tablet by mouth 4 (four) times daily as needed. (Patient taking differently: Take 1 tablet 4 (four) times daily as needed by mouth (takes 1/4 tablet nightly).  )  . latanoprost (XALATAN) 0.005 % ophthalmic solution Place 1 drop at bedtime into both eyes.  . metoprolol tartrate (LOPRESSOR) 25 MG tablet Take 25 mg by mouth 2 (two) times daily.  . mometasone (ASMANEX) 220 MCG/INH inhaler Inhale 2 puffs into the lungs daily.  . Multiple Vitamin (MULTI-VITAMINS) TABS Take daily by mouth.   Marland Kitchen omeprazole (PRILOSEC) 40 MG capsule Take 1 capsule (40 mg total) by mouth daily.  . pravastatin (PRAVACHOL) 20 MG tablet Take by mouth.  . terazosin (HYTRIN) 5 MG capsule Take 5 mg by mouth at bedtime.  . timolol (BETIMOL) 0.25 % ophthalmic solution 1-2 drops 2 (two) times daily.  . [DISCONTINUED] doxycycline (VIBRA-TABS) 100 MG tablet Take 1 tablet (100 mg total) by mouth 2 (two) times daily.   No facility-administered encounter medications on file as of 08/23/2017.     Activities of Daily Living In your present state of health, do you have any difficulty performing the following activities: 08/23/2017 08/27/2016  Hearing? Y Y  Comment wear bilateral hearing aids -  Vision? N N  Difficulty concentrating or making decisions? N N  Walking or climbing stairs? N N  Dressing or bathing? N N  Doing errands, shopping? N N  Preparing Food and eating ? N -  Using the Toilet? N -  In  the past six months, have you accidently leaked urine? N -  Do you have problems with loss of bowel control? N -  Managing your Medications? N -  Managing your Finances? N -  Housekeeping or managing your Housekeeping? N -  Some recent data might be hidden    Patient Care Team: Jerrol Banana., MD as PCP - General (Unknown Physician Specialty) Beacher May, MD as Referring Physician (Internal Medicine) Charletta Cousin, MD as Referring Physician (Ophthalmology) Corey Skains, MD as Consulting Physician (Cardiology)   Assessment:     Exercise Activities and Dietary recommendations Current Exercise Habits: Home exercise routine, Type of exercise:  walking;Other - see comments(pushups), Time (Minutes): 20, Intensity: Mild, Exercise limited by: Other - see comments(crushed ankle)  Goals    None     Fall Risk Fall Risk  08/23/2017 08/27/2016 08/16/2015  Falls in the past year? No No No   Depression Screen PHQ 2/9 Scores 08/23/2017 08/27/2016 08/16/2015 08/16/2015  PHQ - 2 Score 0 0 0 0    Cognitive Function: Pt declined screening today.     6CIT Screen 08/27/2016  What Year? 0 points  What month? 0 points  What time? 0 points  Count back from 20 0 points  Months in reverse 0 points  Repeat phrase 6 points  Total Score 6    Immunization History  Administered Date(s) Administered  . Influenza, High Dose Seasonal PF 09/09/2016  . Pneumococcal Conjugate-13 08/16/2014  . Pneumococcal Polysaccharide-23 02/15/2015  . Td 01/24/2004, 04/16/2011, 05/25/2016  . Tdap 04/16/2011   Screening Tests Health Maintenance  Topic Date Due  . TETANUS/TDAP  05/25/2026  . INFLUENZA VACCINE  Completed  . PNA vac Low Risk Adult  Completed      Plan:  I have personally reviewed and addressed the Medicare Annual Wellness questionnaire and have noted the following in the patient's chart:  A. Medical and social history B. Use of alcohol, tobacco or illicit drugs  C. Current medications and supplements D. Functional ability and status E.  Nutritional status F.  Physical activity G. Advance directives H. List of other physicians I.  Hospitalizations, surgeries, and ER visits in previous 12 months J.  [place] such as hearing and vision if needed, cognitive and depression L. Referrals and appointments - none  In addition, I have reviewed and discussed with patient certain preventive protocols, quality metrics, and best practice recommendations. A written personalized care plan for preventive services as well as general preventive health recommendations were provided to patient.  See attached scanned questionnaire for additional  information.   Signed,  Fabio Neighbors, LPN Nurse Health Advisor   MD Recommendations: None.  Reviewed Nurse Health Advisor's documentation and recommendations. Was available for consultation and agree with plan.

## 2017-08-30 ENCOUNTER — Ambulatory Visit (INDEPENDENT_AMBULATORY_CARE_PROVIDER_SITE_OTHER): Payer: PPO | Admitting: Family Medicine

## 2017-08-30 ENCOUNTER — Encounter: Payer: Self-pay | Admitting: Family Medicine

## 2017-08-30 VITALS — BP 148/62 | HR 60 | Temp 97.7°F | Resp 16 | Wt 167.0 lb

## 2017-08-30 DIAGNOSIS — K2100 Gastro-esophageal reflux disease with esophagitis, without bleeding: Secondary | ICD-10-CM

## 2017-08-30 DIAGNOSIS — I10 Essential (primary) hypertension: Secondary | ICD-10-CM

## 2017-08-30 DIAGNOSIS — R011 Cardiac murmur, unspecified: Secondary | ICD-10-CM | POA: Diagnosis not present

## 2017-08-30 DIAGNOSIS — E782 Mixed hyperlipidemia: Secondary | ICD-10-CM

## 2017-08-30 DIAGNOSIS — J4520 Mild intermittent asthma, uncomplicated: Secondary | ICD-10-CM | POA: Diagnosis not present

## 2017-08-30 DIAGNOSIS — D496 Neoplasm of unspecified behavior of brain: Secondary | ICD-10-CM

## 2017-08-30 LAB — CBC WITH DIFFERENTIAL/PLATELET
BASOS PCT: 0.9 %
Basophils Absolute: 68 cells/uL (ref 0–200)
EOS PCT: 9.7 %
Eosinophils Absolute: 728 cells/uL — ABNORMAL HIGH (ref 15–500)
HCT: 40.7 % (ref 38.5–50.0)
HEMOGLOBIN: 13.8 g/dL (ref 13.2–17.1)
Lymphs Abs: 1665 cells/uL (ref 850–3900)
MCH: 30.2 pg (ref 27.0–33.0)
MCHC: 33.9 g/dL (ref 32.0–36.0)
MCV: 89.1 fL (ref 80.0–100.0)
MONOS PCT: 8.7 %
MPV: 11 fL (ref 7.5–12.5)
NEUTROS ABS: 4388 {cells}/uL (ref 1500–7800)
Neutrophils Relative %: 58.5 %
Platelets: 193 10*3/uL (ref 140–400)
RBC: 4.57 10*6/uL (ref 4.20–5.80)
RDW: 12.4 % (ref 11.0–15.0)
Total Lymphocyte: 22.2 %
WBC mixed population: 653 cells/uL (ref 200–950)
WBC: 7.5 10*3/uL (ref 3.8–10.8)

## 2017-08-30 LAB — COMPLETE METABOLIC PANEL WITH GFR
AG RATIO: 1.5 (calc) (ref 1.0–2.5)
ALT: 10 U/L (ref 9–46)
AST: 15 U/L (ref 10–35)
Albumin: 3.8 g/dL (ref 3.6–5.1)
Alkaline phosphatase (APISO): 68 U/L (ref 40–115)
BILIRUBIN TOTAL: 0.5 mg/dL (ref 0.2–1.2)
BUN: 17 mg/dL (ref 7–25)
CHLORIDE: 109 mmol/L (ref 98–110)
CO2: 27 mmol/L (ref 20–32)
Calcium: 8.7 mg/dL (ref 8.6–10.3)
Creat: 0.95 mg/dL (ref 0.70–1.11)
GFR, Est African American: 86 mL/min/{1.73_m2} (ref >=60)
GFR, Est Non African American: 74 mL/min/{1.73_m2} (ref >=60)
GLUCOSE: 98 mg/dL (ref 65–99)
Globulin: 2.6 g/dL (calc) (ref 1.9–3.7)
POTASSIUM: 4.6 mmol/L (ref 3.5–5.3)
Sodium: 140 mmol/L (ref 135–146)
Total Protein: 6.4 g/dL (ref 6.1–8.1)

## 2017-08-30 LAB — TSH: TSH: 3.83 m[IU]/L (ref 0.40–4.50)

## 2017-08-30 NOTE — Progress Notes (Signed)
Patient: Mark Sanchez., Male    DOB: 1933-06-15, 83 y.o.   MRN: [id number] Visit Date: 08/30/2017  Today's Provider: Vernie Murders, PA   Chief Complaint  Patient presents with  . Annual Exam   Subjective:    Annual physical exam Icholas Sanchez. is a 83 y.o. male who presents today for health maintenance and complete physical. He feels well. He reports exercising as much as he can, but looking for a better exercise progam. He reports he is sleeping well.  ----------------------------------------------------------------- AWE was on 08/23/2017   Review of Systems  Constitutional: Negative.   HENT: Negative.   Eyes: Negative.   Respiratory: Negative.   Cardiovascular: Negative.   Gastrointestinal: Negative.   Endocrine: Negative.   Genitourinary: Negative.   Musculoskeletal: Negative.   Skin: Negative.   Allergic/Immunologic: Negative.   Neurological: Negative.   Hematological: Negative.   Psychiatric/Behavioral: Negative.     Social History      He  reports that he quit smoking about 17 years ago. His smoking use included cigars. he has never used smokeless tobacco. He reports that he drinks alcohol. He reports that he does not use drugs.       Social History   Socioeconomic History  . Marital status: Married    Spouse name: None  . Number of children: None  . Years of education: None  . Highest education level: None  Social Needs  . Financial resource strain: None  . Food insecurity - worry: None  . Food insecurity - inability: None  . Transportation needs - medical: None  . Transportation needs - non-medical: None  Occupational History  . None  Tobacco Use  . Smoking status: Former Smoker    Types: Cigars    Last attempt to quit: 10/15/1999    Years since quitting: 17.8  . Smokeless tobacco: Never Used  . Tobacco comment: quit about 15 years ago  Substance and Sexual Activity  . Alcohol use: Yes    Comment: occasionally, 1-2 drinks  monthly  . Drug use: No  . Sexual activity: Not Currently  Other Topics Concern  . None  Social History Narrative  . None    Past Medical History:  Diagnosis Date  . Arthritis    left ankle  . Brain tumor (benign) ([place])   . COPD (chronic obstructive pulmonary disease) ([place])   . GERD (gastroesophageal reflux disease)   . Heart murmur   . Hypertension   . Seasonal allergies    Patient Active Problem List   Diagnosis Date Noted  . Chronic kidney disease 08/16/2015  . Supraventricular tachycardia ([place]) 08/16/2015  . Allergic rhinitis 08/04/2015  . Airway hyperreactivity 08/04/2015  . Benign fibroma of prostate 08/04/2015  . CAFL (chronic airflow limitation) ([place]) 08/04/2015  . Essential (primary) hypertension 08/04/2015  . Acid reflux 08/04/2015  . Glaucoma 08/04/2015  . HLD (hyperlipidemia) 08/04/2015  . Mild cognitive disorder 08/04/2015  . Arthritis, degenerative 08/04/2015  . Asymptomatic varicose veins 08/04/2015  . TI (tricuspid incompetence) 11/21/2014  . Paroxysmal atrial fibrillation ([place]) 11/19/2014  . Esophagitis, reflux 11/08/2014  . MI (mitral incompetence) 11/08/2014  . Nonspecific (abnormal) findings on radiological and other examination of gastrointestinal tract 03/27/2012    Past Surgical History:  Procedure Laterality Date  . EUS  03/27/2012   Procedure: UPPER ENDOSCOPIC ULTRASOUND (EUS) LINEAR;  Surgeon: Milus Banister, MD;  Location: [facility];  Service: Endoscopy;  Laterality: N/A;  [place] EUS  . FINE NEEDLE ASPIRATION  03/27/2012   Procedure: FINE NEEDLE ASPIRATION (FNA) LINEAR;  Surgeon: Milus Banister, MD;  Location: [facility];  Service: Endoscopy;  Laterality: N/A;  . HERNIA REPAIR    . ORIF ANKLE FRACTURE      Family History        Family Status  Relation Name Status  . Mother  Deceased at age 67  . Father  Deceased at age 30       with pneumonia, both legs amputated  . Sister  Alive  . Sister  Deceased        His family  history includes Arthritis in his mother; Breast cancer in his sister and sister; Heart disease in his mother; Hypertension in his mother.     Allergies  Allergen Reactions  . Iodinated Diagnostic Agents Other (See Comments)  . Sulfa Antibiotics Itching and Rash    Allergic reaction was over 30years ago. Patient unsure of exact reaction    Current Outpatient Medications:  .  aspirin 81 MG tablet, Take 81 mg by mouth daily., Disp: , Rfl:  .  Brinzolamide-Brimonidine (SIMBRINZA) 1-0.2 % SUSP, Apply 3 drops daily to eye. , Disp: , Rfl:  .  fluticasone (FLONASE) 50 MCG/ACT nasal spray, TAKE TWO PUFFS EACH NOSTRIL DAILY., Disp: 48 g, Rfl: 12 .  HYDROcodone-acetaminophen (NORCO) 10-325 MG tablet, Take 1 tablet by mouth 4 (four) times daily as needed. (Patient taking differently: Take 1 tablet 4 (four) times daily as needed by mouth (takes 1/4 tablet nightly). ), Disp: 100 tablet, Rfl: 0 .  latanoprost (XALATAN) 0.005 % ophthalmic solution, Place 1 drop at bedtime into both eyes., Disp: , Rfl:  .  metoprolol tartrate (LOPRESSOR) 25 MG tablet, Take 25 mg by mouth 2 (two) times daily., Disp: , Rfl:  .  mometasone (ASMANEX) 220 MCG/INH inhaler, Inhale 2 puffs into the lungs daily., Disp: , Rfl:  .  Multiple Vitamin (MULTI-VITAMINS) TABS, Take daily by mouth. , Disp: , Rfl:  .  omeprazole (PRILOSEC) 40 MG capsule, Take 1 capsule (40 mg total) by mouth daily., Disp: 90 capsule, Rfl: 3 .  pravastatin (PRAVACHOL) 20 MG tablet, Take by mouth., Disp: , Rfl:  .  terazosin (HYTRIN) 5 MG capsule, Take 5 mg by mouth at bedtime., Disp: , Rfl:  .  timolol (BETIMOL) 0.25 % ophthalmic solution, 1-2 drops 2 (two) times daily., Disp: , Rfl:    Patient Care Team: Jerrol Banana., MD as PCP - General (Unknown Physician Specialty) Beacher May, MD as Referring Physician (Internal Medicine) Charletta Cousin, MD as Referring Physician (Ophthalmology) Corey Skains, MD as Consulting Physician  (Cardiology)      Objective:   Vitals: BP (!) 148/62 (BP Location: Right Arm, Patient Position: Sitting, Cuff Size: Normal)   Pulse 60   Temp 97.7 F (36.5 C) (Oral)   Resp 16   Wt 167 lb (75.8 kg)   SpO2 97%   BMI 25.39 kg/m    Wt Readings from Last 3 Encounters:  08/30/17 167 lb (75.8 kg)  08/23/17 165 lb (74.8 kg)  03/01/17 165 lb 3.2 oz (74.9 kg)    Physical Exam  Constitutional: He is oriented to person, place, and time. He appears well-developed and well-nourished.  HENT:  Head: Normocephalic.  Right Ear: External ear normal.  Left Ear: External ear normal.  Nose: Nose normal.  Mouth/Throat: Oropharynx is clear and moist.  Eyes: Conjunctivae are normal. Pupils are equal, round, and reactive to light.  Neck: Neck supple.  No thyromegaly present.  Cardiovascular: Normal rate and regular rhythm.  Murmur heard. Pulmonary/Chest: Effort normal and breath sounds normal.  Abdominal: Soft. Bowel sounds are normal.  Genitourinary: Rectum normal and penis normal. Rectal exam shows guaiac negative stool.  Genitourinary Comments: Prostate firm but not enlarged.  Musculoskeletal: Normal range of motion. He exhibits deformity.  Deformity of the left foot/ankle from past crush injury. No movement in the ankle - frozen joint after surgeries.  Lymphadenopathy:    He has no cervical adenopathy.  Neurological: He is alert and oriented to person, place, and time.  Skin: No rash noted.  Psychiatric: He has a normal mood and affect. His behavior is normal. Thought content normal.   Depression Screen PHQ 2/9 Scores 08/23/2017 08/27/2016 08/16/2015 08/16/2015  PHQ - 2 Score 0 0 0 0    Assessment & Plan:     Routine Health Maintenance and Physical Exam  Exercise Activities and Dietary recommendations Goals    Encouraged to increase walking or exercise program 30 minutes 3-4 times a day.      Immunization History  Administered Date(s) Administered  . Influenza, High Dose  Seasonal PF 09/09/2016  . Influenza-Unspecified 08/11/2017  . Pneumococcal Conjugate-13 08/16/2014  . Pneumococcal Polysaccharide-23 02/15/2015  . Td 01/24/2004, 04/16/2011, 05/25/2016  . Tdap 04/16/2011    Health Maintenance  Topic Date Due  . Samul Dada  05/25/2026  . INFLUENZA VACCINE  Completed  . PNA vac Low Risk Adult  Completed     Discussed health benefits of physical activity, and encouraged him to engage in regular exercise appropriate for his age and condition.    -------------------------------------------------------------------- 1. Essential (primary) hypertension Well controlled BP. Still taking Hytrin 5 mg qd and Metoprolol Tartrate 25 mg BID. No dizziness or palpitations recently. Followed by Dr. Nehemiah Massed (cardiologist) annually. Has a history of SVT and stage 2 CKD. Will recheck CMP with GFR, CBC with diff and TSH. Recheck pending lab reports. Had Medicare Annual Wellness Screening on 08-23-17. - COMPLETE METABOLIC PANEL WITH GFR - CBC with Differential/Platelet - TSH  2. Mild intermittent asthma without complication No acute flares as long as he uses the Asmanex regularly. No hemoptysis, dyspnea or sputum production. Has not had to use any Albuterol in a long time.  3. Esophagitis, reflux Stable and well controlled by Omeprazole 20 mg qd. No hematemesis, hematochezia or abdominal pain. Check CBC and continue present dosage. - CBC with Differential/Platelet  4. Mixed hyperlipidemia Tolerating Pravastatin 20 mg qd without side effects. Had recent fasting lipid profile showing total cholesterol 131, HDL 41, LDL 77, with liver enzymes within normal limits by Dr. Nehemiah Massed (cardiologist) on 08-09-17. Recheck CMP and TSH today. Continue medications, diet, exercise and follow up with cardiologist. - COMPLETE METABOLIC PANEL WITH GFR - TSH  5. Heart murmur Followed by Dr. Nehemiah Massed (cardiologist) and had echocardiogram in February 2016 showing normal LV function with  EF 55% and moderate aortic, mitral, and tricuspid regurgitation with severe pulmonic regurgitation.  6. Brain tumor ([place]) Followed at the [facility] annually. States they have classified his tumor as benign. No significant headaches, dizziness or seizures. Recheck routine labs and continue annual follow up at the [facility] for MRI. - COMPLETE METABOLIC PANEL WITH GFR - CBC with Differential/Platelet - TSH    Vernie Murders, PA  [facility]

## 2017-09-11 ENCOUNTER — Ambulatory Visit: Payer: PPO | Admitting: Family Medicine

## 2017-09-11 ENCOUNTER — Encounter: Payer: Self-pay | Admitting: Family Medicine

## 2017-09-11 VITALS — BP 140/66 | HR 72 | Temp 97.9°F | Resp 16 | Wt 165.2 lb

## 2017-09-11 DIAGNOSIS — B309 Viral conjunctivitis, unspecified: Secondary | ICD-10-CM

## 2017-09-11 MED ORDER — CIPROFLOXACIN HCL 0.3 % OP SOLN: OPHTHALMIC | 0 refills | Status: DC

## 2017-09-11 NOTE — Patient Instructions (Signed)
Discussed use of warm compresses several x day. If not improving in the next 24 hours start the antibiotic drops.

## 2017-09-11 NOTE — Progress Notes (Signed)
Subjective:     Patient ID: Mark Ends Sr., male   DOB: [birth year], 83 y.o.   MRN: [id number] Chief Complaint  Patient presents with  . Eye Drainage    Patient comes in office today with complaints of watery itchy eyes for the past two days. Patient reports burning and crusing of both eyes, he started to take old prescription Cipro eye drops last night for relief.   Denies changes in vision. Reports compliance with glaucoma medication and 6 month visits with his glaucoma specialist. HPI   Review of Systems     Objective:   Physical Exam  Constitutional: He appears well-developed and well-nourished. No distress.  Eyes:  Pupils equal and aphakic. Tearing is noted with mild scleral injection bilaterally and residual crusting from earlier in the day.       Assessment:    1. Acute viral conjunctivitis of both eyes: discussed use of warm compresses today - ciprofloxacin (CILOXAN) 0.3 % ophthalmic solution; Administer 1 drop, every 2 hours, while awake, for 2 days. Then 1 drop, every 4 hours x 5 days  Dispense: 5 mL; Refill: 0    Plan:    To start the abx if eyes not improving tomorrow.

## 2017-10-01 ENCOUNTER — Other Ambulatory Visit: Payer: Self-pay | Admitting: Pharmacy Technician

## 2017-10-01 NOTE — Patient Outreach (Signed)
[facility]) Care Management  10/01/2017  Mark LOUVIER Sr. 03-27-1933 [id number]   Incoming [employer] EMMI call in reference to medication adherence. HIPAA identifiers verified and verbal consent received. I spoke with Renato Battles spouse of patient and she states that he takes his medications daily as prescribed. He does not currently have any barriers that would affected his adherence.  Doreene Burke, [place] [phone]

## 2017-10-21 DIAGNOSIS — I480 Paroxysmal atrial fibrillation: Secondary | ICD-10-CM | POA: Diagnosis not present

## 2017-10-21 DIAGNOSIS — I351 Nonrheumatic aortic (valve) insufficiency: Secondary | ICD-10-CM | POA: Diagnosis not present

## 2017-10-21 DIAGNOSIS — E782 Mixed hyperlipidemia: Secondary | ICD-10-CM | POA: Diagnosis not present

## 2017-10-21 DIAGNOSIS — I10 Essential (primary) hypertension: Secondary | ICD-10-CM | POA: Diagnosis not present

## 2017-10-28 DIAGNOSIS — I340 Nonrheumatic mitral (valve) insufficiency: Secondary | ICD-10-CM | POA: Diagnosis not present

## 2017-10-28 DIAGNOSIS — E782 Mixed hyperlipidemia: Secondary | ICD-10-CM | POA: Diagnosis not present

## 2017-10-28 DIAGNOSIS — I351 Nonrheumatic aortic (valve) insufficiency: Secondary | ICD-10-CM | POA: Diagnosis not present

## 2017-10-28 DIAGNOSIS — I10 Essential (primary) hypertension: Secondary | ICD-10-CM | POA: Diagnosis not present

## 2017-10-28 DIAGNOSIS — I480 Paroxysmal atrial fibrillation: Secondary | ICD-10-CM | POA: Diagnosis not present

## 2017-12-09 DIAGNOSIS — D3615 Benign neoplasm of peripheral nerves and autonomic nervous system of abdomen: Secondary | ICD-10-CM | POA: Diagnosis not present

## 2017-12-09 DIAGNOSIS — X32XXXA Exposure to sunlight, initial encounter: Secondary | ICD-10-CM | POA: Diagnosis not present

## 2017-12-09 DIAGNOSIS — D225 Melanocytic nevi of trunk: Secondary | ICD-10-CM | POA: Diagnosis not present

## 2017-12-09 DIAGNOSIS — L570 Actinic keratosis: Secondary | ICD-10-CM | POA: Diagnosis not present

## 2017-12-09 DIAGNOSIS — D3614 Benign neoplasm of peripheral nerves and autonomic nervous system of thorax: Secondary | ICD-10-CM | POA: Diagnosis not present

## 2017-12-09 DIAGNOSIS — D2271 Melanocytic nevi of right lower limb, including hip: Secondary | ICD-10-CM | POA: Diagnosis not present

## 2018-04-28 DIAGNOSIS — I351 Nonrheumatic aortic (valve) insufficiency: Secondary | ICD-10-CM | POA: Diagnosis not present

## 2018-04-28 DIAGNOSIS — E782 Mixed hyperlipidemia: Secondary | ICD-10-CM | POA: Diagnosis not present

## 2018-04-28 DIAGNOSIS — I10 Essential (primary) hypertension: Secondary | ICD-10-CM | POA: Diagnosis not present

## 2018-04-28 DIAGNOSIS — R9431 Abnormal electrocardiogram [ECG] [EKG]: Secondary | ICD-10-CM | POA: Diagnosis not present

## 2018-04-28 DIAGNOSIS — R001 Bradycardia, unspecified: Secondary | ICD-10-CM | POA: Diagnosis not present

## 2018-04-28 DIAGNOSIS — I480 Paroxysmal atrial fibrillation: Secondary | ICD-10-CM | POA: Diagnosis not present

## 2018-06-10 ENCOUNTER — Telehealth: Payer: Self-pay | Admitting: Family Medicine

## 2018-06-10 MED ORDER — OMEPRAZOLE 20 MG PO CPDR: 20.0000 mg | DELAYED_RELEASE_CAPSULE | Freq: Every day | ORAL | 1 refills | Status: DC

## 2018-06-10 NOTE — Telephone Encounter (Signed)
ok 

## 2018-06-10 NOTE — Telephone Encounter (Signed)
RX sent, pt advised.   Thanks,   -Mickel Baas

## 2018-06-10 NOTE — Telephone Encounter (Signed)
Pt wants to know if you will decrease his Omeprazole 40 mg to 20mg   He thinks the higher dose is causing him to have body aches..He has tried the 20 mg otc and it works ok.  He wants a 3 month supply sent to [facility]  Their call back is  [phone]  Thanks teri

## 2018-07-11 ENCOUNTER — Telehealth: Payer: Self-pay

## 2018-07-11 NOTE — Telephone Encounter (Signed)
LMTCB and schedule AWV. Pt already has a CPE scheduled for 09/05/18. Last AWV was 08/23/17. -MM

## 2018-07-18 ENCOUNTER — Other Ambulatory Visit: Payer: Self-pay | Admitting: Family Medicine

## 2018-07-25 NOTE — Telephone Encounter (Signed)
Called pt and wife answered. Advised her pt needed to schedule his AWV prior to his CPE. Wife states they are in the car and she does not have her calender. Will CB.. -MM

## 2018-08-05 NOTE — Telephone Encounter (Signed)
Scheduled for 09/05/18 @ 10:40 AM.

## 2018-09-04 NOTE — Progress Notes (Signed)
Patient: Mark Ortwein., Male    DOB: August 29, 1933, 84 y.o.   MRN: [id number] Visit Date: 09/05/2018  Today's Provider: Vernie Murders, PA   Chief Complaint  Patient presents with  . Annual Exam   Subjective:    Annual wellness visit Mark SARGENT Sr. is a 84 y.o. male. He feels well. He reports exercising some everyday by walking if weather is good and also does stretching exercises and push ups in his shop daily. He reports he is sleeping well.  Wt Readings from Last 3 Encounters:  09/05/18 164 lb (74.4 kg)  09/05/18 164 lb (74.4 kg)  09/11/17 165 lb 3.2 oz (74.9 kg)    BP Readings from Last 3 Encounters:  09/05/18 (!) 164/66  09/05/18 (!) 164/66  09/11/17 140/66   AWE screening by Nurse Health Advisor accomplished today. -----------------------------------------------------------  Review of Systems  Constitutional: Negative.   HENT: Negative.   Eyes: Negative.   Respiratory: Negative.   Cardiovascular: Negative.   Gastrointestinal: Negative.   Endocrine: Negative.   Genitourinary: Negative.   Musculoskeletal: Positive for arthralgias (ankle pain from fusion). Negative for back pain, gait problem, joint swelling, myalgias, neck pain and neck stiffness.  Skin: Negative.   Allergic/Immunologic: Negative.   Neurological: Negative.   Hematological: Negative.   Psychiatric/Behavioral: Negative.    Social History   Socioeconomic History  . Marital status: Married    Spouse name: Not on file  . Number of children: 1  . Years of education: Not on file  . Highest education level: High school graduate  Occupational History  . Occupation: part time  Social Needs  . Financial resource strain: Not hard at all  . Food insecurity:    Worry: Never true    Inability: Never true  . Transportation needs:    Medical: No    Non-medical: No  Tobacco Use  . Smoking status: Former Smoker    Types: Cigars    Last attempt to quit: 10/15/1999    Years since  quitting: 18.9  . Smokeless tobacco: Never Used  . Tobacco comment: quit about 15 years ago  Substance and Sexual Activity  . Alcohol use: Yes    Comment: occasionally, 1-2 drinks monthly  . Drug use: No  . Sexual activity: Not Currently  Lifestyle  . Physical activity:    Days per week: 0 days    Minutes per session: 0 min  . Stress: Only a little  Relationships  . Social connections:    Talks on phone: Patient refused    Gets together: Patient refused    Attends religious service: Patient refused    Active member of club or organization: Patient refused    Attends meetings of clubs or organizations: Patient refused    Relationship status: Patient refused  . Intimate partner violence:    Fear of current or ex partner: Patient refused    Emotionally abused: Patient refused    Physically abused: Patient refused    Forced sexual activity: Patient refused  Other Topics Concern  . Not on file  Social History Narrative  . Not on file   Past Medical History:  Diagnosis Date  . Arthritis    left ankle  . Brain tumor (benign) ([place])   . COPD (chronic obstructive pulmonary disease) ([place])   . GERD (gastroesophageal reflux disease)   . Heart murmur   . Hypertension   . Seasonal allergies     Patient Active Problem List  Diagnosis Date Noted  . Chronic kidney disease 08/16/2015  . Supraventricular tachycardia ([place]) 08/16/2015  . Allergic rhinitis 08/04/2015  . Airway hyperreactivity 08/04/2015  . Benign fibroma of prostate 08/04/2015  . CAFL (chronic airflow limitation) ([place]) 08/04/2015  . Essential (primary) hypertension 08/04/2015  . Acid reflux 08/04/2015  . Glaucoma 08/04/2015  . HLD (hyperlipidemia) 08/04/2015  . Mild cognitive disorder 08/04/2015  . Arthritis, degenerative 08/04/2015  . Asymptomatic varicose veins 08/04/2015  . TI (tricuspid incompetence) 11/21/2014  . Paroxysmal atrial fibrillation ([place]) 11/19/2014  . Esophagitis, reflux 11/08/2014  . MI (mitral  incompetence) 11/08/2014  . Nonspecific (abnormal) findings on radiological and other examination of gastrointestinal tract 03/27/2012   Past Surgical History:  Procedure Laterality Date  . EUS  03/27/2012   Procedure: UPPER ENDOSCOPIC ULTRASOUND (EUS) LINEAR;  Surgeon: Milus Banister, MD;  Location: [facility];  Service: Endoscopy;  Laterality: N/A;  [place] EUS  . FINE NEEDLE ASPIRATION  03/27/2012   Procedure: FINE NEEDLE ASPIRATION (FNA) LINEAR;  Surgeon: Milus Banister, MD;  Location: [facility];  Service: Endoscopy;  Laterality: N/A;  . HERNIA REPAIR    . ORIF ANKLE FRACTURE     His family history includes Arthritis in his mother; Breast cancer in his sister and sister; Heart disease in his mother; Hypertension in his mother.      Current Outpatient Medications:  .  aspirin 81 MG tablet, Take 81 mg by mouth daily., Disp: , Rfl:  .  Brinzolamide-Brimonidine (SIMBRINZA) 1-0.2 % SUSP, Apply 3 drops daily to eye. , Disp: , Rfl:  .  carboxymethylcellulose (REFRESH PLUS) 0.5 % SOLN, Place 1 drop into both eyes 4 (four) times daily., Disp: , Rfl:  .  cetirizine (ZYRTEC) 10 MG tablet, Take 10 mg by mouth daily., Disp: , Rfl:  .  fluticasone (FLONASE) 50 MCG/ACT nasal spray, TAKE TWO PUFFS EACH NOSTRIL DAILY., Disp: 48 g, Rfl: 3 .  latanoprost (XALATAN) 0.005 % ophthalmic solution, Place 1 drop at bedtime into both eyes., Disp: , Rfl:  .  metoprolol tartrate (LOPRESSOR) 25 MG tablet, Take 12.5 mg by mouth 2 (two) times daily. , Disp: , Rfl:  .  mometasone (ASMANEX) 220 MCG/INH inhaler, Inhale 1 puff into the lungs daily. , Disp: , Rfl:  .  Multiple Vitamin (MULTI-VITAMINS) TABS, Take daily by mouth. , Disp: , Rfl:  .  Multiple Vitamins-Minerals (PRESERVISION AREDS 2 PO), Take by mouth., Disp: , Rfl:  .  Netarsudil Dimesylate 0.02 % SOLN, Apply 1 drop to eye at bedtime., Disp: , Rfl:  .  omeprazole (PRILOSEC) 20 MG capsule, Take 1 capsule (20 mg total) by mouth daily., Disp: 90 capsule,  Rfl: 1 .  pravastatin (PRAVACHOL) 20 MG tablet, Take 20 mg by mouth daily. , Disp: , Rfl:  .  terazosin (HYTRIN) 5 MG capsule, Take 5 mg by mouth at bedtime., Disp: , Rfl:  .  timolol (BETIMOL) 0.25 % ophthalmic solution, 1-2 drops 2 (two) times daily., Disp: , Rfl:  .  HYDROcodone-acetaminophen (NORCO) 10-325 MG tablet, Take 1 tablet by mouth 4 (four) times daily as needed. Reviewed controlled substance record from PMP Alert., Disp: 100 tablet, Rfl: 0  Patient Care Team: Jerrol Banana., MD as PCP - General (Family Medicine) Beacher May, MD as Referring Physician (Internal Medicine) Charletta Cousin, MD as Referring Physician (Ophthalmology) Corey Skains, MD as Consulting Physician (Cardiology) Jaziel Bennett, Vickki Muff, PA (Family Medicine)    Objective:   Vitals: BP (!) 164/66 (BP  Location: Right Arm, Patient Position: Sitting, Cuff Size: Normal)   Pulse 69   Temp 97.8 F (36.6 C) (Oral)   Ht 5\' 10"  (1.778 m)   Wt 164 lb (74.4 kg)   SpO2 98%   BMI 23.53 kg/m   Physical Exam  Constitutional: He is oriented to person, place, and time. He appears well-developed and well-nourished.  HENT:  Head: Normocephalic and atraumatic.  Right Ear: External ear normal.  Left Ear: External ear normal.  Nose: Nose normal.  Mouth/Throat: Oropharynx is clear and moist.  Eyes: Pupils are equal, round, and reactive to light. Conjunctivae and EOM are normal. Right eye exhibits no discharge.  Neck: Normal range of motion. Neck supple. No tracheal deviation present. No thyromegaly present.  Cardiovascular: Normal rate, regular rhythm and intact distal pulses.  Murmur heard. Unchanged Grade II/VI systolic murmur with history of mitral and tricuspid incompetence.  Pulmonary/Chest: Effort normal and breath sounds normal. No respiratory distress. He has no wheezes. He has no rales. He exhibits no tenderness.  Abdominal: Soft. He exhibits no distension and no mass. There is no  tenderness. There is no rebound and no guarding.  Musculoskeletal: Normal range of motion. He exhibits no edema or tenderness.  Deformity of the left foot/ankle from past crush injury. No movement in the ankle - frozen joint after surgeries.   Lymphadenopathy:    He has no cervical adenopathy.  Neurological: He is alert and oriented to person, place, and time. He has normal reflexes. He displays normal reflexes. No cranial nerve deficit. He exhibits normal muscle tone. Coordination normal.  Skin: Skin is warm and dry. No rash noted. No erythema.  Psychiatric: He has a normal mood and affect. His behavior is normal. Judgment and thought content normal.    Activities of Daily Living In your present state of health, do you have any difficulty performing the following activities: 09/05/2018  Hearing? Y  Comment Wears bilateral hearing aids.  Vision? Y  Comment Has trouble seeing at night. Wears eye glasses.   Difficulty concentrating or making decisions? N  Walking or climbing stairs? N  Dressing or bathing? N  Doing errands, shopping? N  Preparing Food and eating ? N  Using the Toilet? N  In the past six months, have you accidently leaked urine? N  Do you have problems with loss of bowel control? N  Managing your Medications? N  Managing your Finances? N  Housekeeping or managing your Housekeeping? N  Some recent data might be hidden   Fall Risk Assessment Fall Risk  09/05/2018 09/05/2018 08/23/2017 08/27/2016 08/16/2015  Falls in the past year? 1 1 No No No  Number falls in past yr: 0 0 - - -  Injury with Fall? 0 0 - - -  Follow up - Falls prevention discussed - - -    Depression Screen PHQ 2/9 Scores 09/05/2018 09/05/2018 09/05/2018 08/23/2017  PHQ - 2 Score 0 0 0 0  PHQ- 9 Score - 0 - -   Cognitive Testing - 6-CIT  Correct? Score   What year is it? yes 4 0 or 4  What month is it? yes 3 0 or 3  Memorize:    Pia Mau,  44,  [place] [address],      What time is it?  (within 1 hour) yes 3 0 or 3  Count backwards from 20 yes 4 0, 2, or 4  Name the months of the year yes 4 0, 2, or 4  Repeat name & address above yes 10 0, 2, 4, 6, 8, or 10       TOTAL SCORE  28/28   Interpretation:  Normal  Normal (0-7) Abnormal (8-28)   Assessment & Plan:     Annual Wellness Visit  Reviewed patient's Family Medical History Reviewed and updated list of patient's medical providers Assessment of cognitive impairment was done Assessed patient's functional ability Established a written schedule for health screening [place] Completed and Reviewed  Exercise Activities and Dietary recommendations Goals    . Exercise 3x per week (30 min per time)     Recommend to exercise for 3 days a week for at least 30 minutes at a time.      . Increase water intake     Recommend drinking 6-8 glasses of water daily.       Immunization History  Administered Date(s) Administered  . Influenza, High Dose Seasonal PF 09/09/2016  . Influenza-Unspecified 08/11/2017  . Pneumococcal Conjugate-13 08/16/2014  . Pneumococcal Polysaccharide-23 02/15/2015  . Td 01/24/2004, 04/16/2011, 05/25/2016  . Tdap 04/16/2011   Health Maintenance  Topic Date Due  . Samul Dada  05/25/2026  . INFLUENZA VACCINE  Completed  . PNA vac Low Risk Adult  Completed   Discussed health benefits of physical activity, and encouraged him to engage in regular exercise appropriate for his age and condition.    ------------------------------------------------------------------------------------------------------------ 1. Essential (primary) hypertension Continues to take Hytrin 5 mg q pm with Metoprolol Tartrate 25 mg BID. Systolic BP high today. Feeling well. Continues to follow up with cardiologist annually (Dr. Nehemiah Massed). Check routine labs and he would like to monitor BP at home for a week or two before considering medication adjustments. Immunizations up to date and general health  stable by AWE screening today. Had flu shot given at the [facility] on 07-15-18. - CBC with Differential/Platelet - Comprehensive metabolic panel - TSH  2. Paroxysmal atrial fibrillation (HCC) Stable on the Metoprolol Tartrate 25 mg BID and rechecked by Dr. Nehemiah Massed (cardiologist) annually. Recheck CMP and TSH. No heartrate irregularity today. Denies chest pains, palpitations or dyspnea. - Comprehensive metabolic panel - TSH  3. Gastroesophageal reflux disease without esophagitis Well controlled heartburn with use of Omeprazole 20 mg qd with occasional need of antacid. No hematemesis, melena or hematochezia. Check CBC for signs of anemia. - CBC with Differential/Platelet  4. Asymptomatic varicose veins Large veins with occasional edema by the end of the day (R>L). No decubitus ulcers. Good pulses. Elevation and overnight rest helps edema resolve. May use support hose.  5. Chronic kidney disease, unspecified CKD stage No outlet obstruction symptoms or nocturia. Last creatinine level was 0.95 with GFR of 74 on 08-30-17. Well controlled but BP elevated today. Will recheck labs and continue to work on BP control while drinking more water in diet. - CBC with Differential/Platelet - Comprehensive metabolic panel  6. Mixed hyperlipidemia Tolerating the Pravastatin 20 mg qd without side effects. Will check CMP, Lipid Panel and TSH. Continue present dosage and low fat diet. Follow up pending lab reports. - Comprehensive metabolic panel - Lipid panel - TSH  7. Post-traumatic osteoarthritis of left ankle History of crush injury with surgical fixation of the left ankle more than 25 years ago. Ankle is fused in right angle position and has no plantar or dorsal flexion ability. Chronic pain worse at night and will take 1/4 -1/2 tablet of Norco 10-325 mg at bedtime which controls pain to allow him to sleep. Will refill  Norco and check routine labs. Ambulates well with a limp. -  HYDROcodone-acetaminophen (NORCO) 10-325 MG tablet; Take 1 tablet by mouth 4 (four) times daily as needed. Reviewed controlled substance record from PMP Alert.  Dispense: 100 tablet; Refill: 0  8. 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 No neurologic deficits or seizures. MRI from 2016 showed a 22 x 20 x 15 mm lesion in the cerebellopontine angle along the left petrous bone suspected to be a meningioma.    Vernie Murders, PA  [facility]

## 2018-09-05 ENCOUNTER — Encounter: Payer: Self-pay | Admitting: Family Medicine

## 2018-09-05 ENCOUNTER — Other Ambulatory Visit: Payer: Self-pay

## 2018-09-05 ENCOUNTER — Ambulatory Visit (INDEPENDENT_AMBULATORY_CARE_PROVIDER_SITE_OTHER): Payer: PPO | Admitting: Family Medicine

## 2018-09-05 ENCOUNTER — Ambulatory Visit (INDEPENDENT_AMBULATORY_CARE_PROVIDER_SITE_OTHER): Payer: PPO

## 2018-09-05 VITALS — BP 164/66 | HR 69 | Temp 97.8°F | Ht 70.0 in | Wt 164.0 lb

## 2018-09-05 VITALS — BP 164/66 | HR 69 | Temp 97.8°F | Ht 68.0 in | Wt 164.0 lb

## 2018-09-05 DIAGNOSIS — I8390 Asymptomatic varicose veins of unspecified lower extremity: Secondary | ICD-10-CM | POA: Diagnosis not present

## 2018-09-05 DIAGNOSIS — M19172 Post-traumatic osteoarthritis, left ankle and foot: Secondary | ICD-10-CM | POA: Diagnosis not present

## 2018-09-05 DIAGNOSIS — N189 Chronic kidney disease, unspecified: Secondary | ICD-10-CM

## 2018-09-05 DIAGNOSIS — D333 Benign neoplasm of cranial nerves: Secondary | ICD-10-CM | POA: Diagnosis not present

## 2018-09-05 DIAGNOSIS — I480 Paroxysmal atrial fibrillation: Secondary | ICD-10-CM

## 2018-09-05 DIAGNOSIS — K219 Gastro-esophageal reflux disease without esophagitis: Secondary | ICD-10-CM | POA: Diagnosis not present

## 2018-09-05 DIAGNOSIS — Z0000 Encounter for general adult medical examination without abnormal findings: Secondary | ICD-10-CM

## 2018-09-05 DIAGNOSIS — E782 Mixed hyperlipidemia: Secondary | ICD-10-CM | POA: Diagnosis not present

## 2018-09-05 DIAGNOSIS — I10 Essential (primary) hypertension: Secondary | ICD-10-CM

## 2018-09-05 DIAGNOSIS — D320 Benign neoplasm of cerebral meninges: Secondary | ICD-10-CM | POA: Insufficient documentation

## 2018-09-05 MED ORDER — HYDROCODONE-ACETAMINOPHEN 10-325 MG PO TABS: 1.0000 | ORAL_TABLET | Freq: Four times a day (QID) | ORAL | 0 refills | Status: DC | PRN

## 2018-09-05 NOTE — Progress Notes (Signed)
Subjective:   Mark Sanchez Sr. is a 84 y.o. male who presents for Medicare Annual/Subsequent preventive examination.  Review of Systems:  N/A  Cardiac Risk Factors include: advanced age (>9men, >64 women);dyslipidemia;hypertension;male gender     Objective:    Vitals: BP (!) 164/66 (BP Location: Right Arm)   Pulse 69   Temp 97.8 F (36.6 C) (Oral)   Ht 5\' 8"  (1.727 m)   Wt 164 lb (74.4 kg)   BMI 24.94 kg/m   Body mass index is 24.94 kg/m.  Advanced Directives 09/05/2018 08/23/2017 07/12/2016 02/13/2016 03/27/2012  Does Patient Have a Medical Advance Directive? Yes Yes No Yes Patient has advance directive, copy not in chart  Type of Advance Directive [place];Living will Living will - - [place];Living will  Copy of [place] in Chart? No - copy requested - - - -    Tobacco Social History   Tobacco Use  Smoking Status Former Smoker  . Types: Cigars  . Last attempt to quit: 10/15/1999  . Years since quitting: 18.9  Smokeless Tobacco Never Used  Tobacco Comment   quit about 15 years ago     Counseling given: Not Answered Comment: quit about 15 years ago   Clinical Intake:     Pain : No/denies pain Pain Score: 0-No pain     Nutritional Status: BMI of 19-24  Normal Nutritional Risks: None Diabetes: No  How often do you need to have someone help you when you read instructions, pamphlets, or other written materials from your doctor or pharmacy?: 1 - Never  Interpreter Needed?: No  Information entered by :: [facility], LPN  Past Medical History:  Diagnosis Date  . Arthritis    left ankle  . Brain tumor (benign) ([place])   . COPD (chronic obstructive pulmonary disease) ([place])   . GERD (gastroesophageal reflux disease)   . Heart murmur   . Hypertension   . Seasonal allergies    Past Surgical History:  Procedure Laterality Date  . EUS  03/27/2012   Procedure: UPPER ENDOSCOPIC ULTRASOUND (EUS)  LINEAR;  Surgeon: Milus Banister, MD;  Location: [facility];  Service: Endoscopy;  Laterality: N/A;  [place] EUS  . FINE NEEDLE ASPIRATION  03/27/2012   Procedure: FINE NEEDLE ASPIRATION (FNA) LINEAR;  Surgeon: Milus Banister, MD;  Location: [facility];  Service: Endoscopy;  Laterality: N/A;  . HERNIA REPAIR    . ORIF ANKLE FRACTURE     Family History  Problem Relation Age of Onset  . Heart disease Mother   . Hypertension Mother   . Arthritis Mother   . Breast cancer Sister   . Breast cancer Sister    Social History   Socioeconomic History  . Marital status: Married    Spouse name: Not on file  . Number of children: 1  . Years of education: Not on file  . Highest education level: High school graduate  Occupational History  . Occupation: part time  Social Needs  . Financial resource strain: Not hard at all  . Food insecurity:    Worry: Never true    Inability: Never true  . Transportation needs:    Medical: No    Non-medical: No  Tobacco Use  . Smoking status: Former Smoker    Types: Cigars    Last attempt to quit: 10/15/1999    Years since quitting: 18.9  . Smokeless tobacco: Never Used  . Tobacco comment: quit about 15 years  ago  Substance and Sexual Activity  . Alcohol use: Yes    Comment: occasionally, 1-2 drinks monthly  . Drug use: No  . Sexual activity: Not Currently  Lifestyle  . Physical activity:    Days per week: 0 days    Minutes per session: 0 min  . Stress: Only a little  Relationships  . Social connections:    Talks on phone: Patient refused    Gets together: Patient refused    Attends religious service: Patient refused    Active member of club or organization: Patient refused    Attends meetings of clubs or organizations: Patient refused    Relationship status: Patient refused  Other Topics Concern  . Not on file  Social History Narrative  . Not on file    Outpatient Encounter Medications as of 09/05/2018  Medication Sig  . aspirin  81 MG tablet Take 81 mg by mouth daily.  . Brinzolamide-Brimonidine (SIMBRINZA) 1-0.2 % SUSP Apply 3 drops daily to eye.   . carboxymethylcellulose (REFRESH PLUS) 0.5 % SOLN Place 1 drop into both eyes 4 (four) times daily.  . cetirizine (ZYRTEC) 10 MG tablet Take 10 mg by mouth daily.  . fluticasone (FLONASE) 50 MCG/ACT nasal spray TAKE TWO PUFFS EACH NOSTRIL DAILY.  Marland Kitchen latanoprost (XALATAN) 0.005 % ophthalmic solution Place 1 drop at bedtime into both eyes.  . metoprolol tartrate (LOPRESSOR) 25 MG tablet Take 12.5 mg by mouth 2 (two) times daily.   . mometasone (ASMANEX) 220 MCG/INH inhaler Inhale 1 puff into the lungs daily.   . Multiple Vitamin (MULTI-VITAMINS) TABS Take daily by mouth.   . Netarsudil Dimesylate 0.02 % SOLN Apply 1 drop to eye at bedtime.  Marland Kitchen omeprazole (PRILOSEC) 20 MG capsule Take 1 capsule (20 mg total) by mouth daily.  . pravastatin (PRAVACHOL) 20 MG tablet Take 20 mg by mouth daily.   Marland Kitchen terazosin (HYTRIN) 5 MG capsule Take 5 mg by mouth at bedtime.  . ciprofloxacin (CILOXAN) 0.3 % ophthalmic solution Administer 1 drop, every 2 hours, while awake, for 2 days. Then 1 drop, every 4 hours x 5 days (Patient not taking: Reported on 09/05/2018)  . HYDROcodone-acetaminophen (NORCO) 10-325 MG tablet Take 1 tablet by mouth 4 (four) times daily as needed. (Patient not taking: Reported on 09/05/2018)  . timolol (BETIMOL) 0.25 % ophthalmic solution 1-2 drops 2 (two) times daily.   No facility-administered encounter medications on file as of 09/05/2018.     Activities of Daily Living In your present state of health, do you have any difficulty performing the following activities: 09/05/2018  Hearing? Y  Comment Wears bilateral hearing aids.  Vision? Y  Comment Has trouble seeing at night. Wears eye glasses.   Difficulty concentrating or making decisions? N  Walking or climbing stairs? N  Dressing or bathing? N  Doing errands, shopping? N  Preparing Food and eating ? N  Using  the Toilet? N  In the past six months, have you accidently leaked urine? N  Do you have problems with loss of bowel control? N  Managing your Medications? N  Managing your Finances? N  Housekeeping or managing your Housekeeping? N  Some recent data might be hidden    Patient Care Team: Jerrol Banana., MD as PCP - General (Family Medicine) Beacher May, MD as Referring Physician (Internal Medicine) Charletta Cousin, MD as Referring Physician (Ophthalmology) Corey Skains, MD as Consulting Physician (Cardiology) Chrismon, Vickki Muff, [place] (Family Medicine)   Assessment:  This is a routine wellness examination for [place].  Exercise Activities and Dietary recommendations Current Exercise Habits: Home exercise routine, Type of exercise: stretching, Time (Minutes): 10, Frequency (Times/Week): 3, Weekly Exercise (Minutes/Week): 30, Intensity: Mild, Exercise limited by: None identified  Goals    . Exercise 3x per week (30 min per time)     Recommend to exercise for 3 days a week for at least 30 minutes at a time.      . Increase water intake     Recommend drinking 6-8 glasses of water daily.        Fall Risk Fall Risk  09/05/2018 08/23/2017 08/27/2016 08/16/2015  Falls in the past year? 1 No No No  Number falls in past yr: 0 - - -  Injury with Fall? 0 - - -  Follow up Falls prevention discussed - - -   FALL RISK PREVENTION PERTAINING TO THE HOME:  Any stairs in or around the home WITH handrails? No  Home free of loose throw rugs in walkways, pet beds, electrical cords, etc? Yes Adequate lighting in your home to reduce risk of falls? Yes  ASSISTIVE DEVICES UTILIZED TO PREVENT FALLS:  Life alert? No  Use of a cane, walker or w/c? No  Grab bars in the bathroom? Yes  Shower chair or bench in shower? No  Elevated toilet seat or a handicapped toilet? No    TIMED UP AND GO:  Was the test performed? No .    Depression Screen PHQ 2/9 Scores  09/05/2018 09/05/2018 08/23/2017 08/27/2016  PHQ - 2 Score 0 0 0 0  PHQ- 9 Score 0 - - -    Cognitive Function: Declined today.      6CIT Screen 08/27/2016  What Year? 0 points  What month? 0 points  What time? 0 points  Count back from 20 0 points  Months in reverse 0 points  Repeat phrase 6 points  Total Score 6    Immunization History  Administered Date(s) Administered  . Influenza, High Dose Seasonal PF 09/09/2016  . Influenza-Unspecified 08/11/2017  . Pneumococcal Conjugate-13 08/16/2014  . Pneumococcal Polysaccharide-23 02/15/2015  . Td 01/24/2004, 04/16/2011, 05/25/2016  . Tdap 04/16/2011    Qualifies for Shingles Vaccine? Yes . Due for Shingrix. Education has been provided regarding the importance of this vaccine. Pt has been advised to call insurance company to determine out of pocket expense. Advised may also receive vaccine at local pharmacy or Health Dept. Verbalized acceptance and understanding.  Tdap: Up to date  Flu Vaccine: Up to date  Pneumococcal Vaccine: Up to date   Screening Tests Health Maintenance  Topic Date Due  . TETANUS/TDAP  05/25/2026  . INFLUENZA VACCINE  Completed  . PNA vac Low Risk Adult  Completed   Cancer Screenings:  Colorectal Screening: No longer required.   Lung Cancer Screening: (Low Dose CT Chest recommended if Age 37-80 years, 30 pack-year currently smoking OR have quit w/in 15years.) does not qualify.    Additional Screening:  Vision Screening: Recommended annual ophthalmology exams for early detection of glaucoma and other disorders of the eye.  Dental Screening: Recommended annual dental exams for proper oral hygiene  Community Resource Referral:  CRR required this visit?  No        Plan:  I have personally reviewed and addressed the Medicare Annual Wellness questionnaire and have noted the following in the patient's chart:  A. Medical and social history B. Use of alcohol, tobacco or illicit drugs   C.  Current medications and supplements D. Functional ability and status E.  Nutritional status F.  Physical activity G. Advance directives H. List of other physicians I.  Hospitalizations, surgeries, and ER visits in previous 12 months J.  [place] such as hearing and vision if needed, cognitive and depression L. Referrals and appointments - none  In addition, I have reviewed and discussed with patient certain preventive protocols, quality metrics, and best practice recommendations. A written personalized care plan for preventive services as well as general preventive health recommendations were provided to patient.  See attached scanned questionnaire for additional information.   Signed,  Fabio Neighbors, LPN Nurse Health Advisor   Nurse Recommendations: None.

## 2018-09-05 NOTE — Patient Instructions (Addendum)
Mark Sanchez , Thank you for taking time to come for your Medicare Wellness Visit. I appreciate your ongoing commitment to your health goals. Please review the following plan we discussed and let me know if I can assist you in the future.   Screening recommendations/referrals: Colonoscopy: No longer required.  Recommended yearly ophthalmology/optometry visit for glaucoma screening and checkup Recommended yearly dental visit for hygiene and checkup  Vaccinations: Influenza vaccine: Up to date Pneumococcal vaccine: Completed series Tdap vaccine: Up to date, due 05/2026 Shingles vaccine: Pt declines today.     Advanced directives: Please bring a copy of your POA (Power of Attorney) and/or Living Will to your next appointment.   Conditions/risks identified: Recommend to exercise for 3 days a week for at least 30 minutes at a time.   Next appointment: 10:40 AM today with Mark Sanchez.   Preventive Care 82 Years and Older, Male Preventive care refers to lifestyle choices and visits with your health care provider that can promote health and wellness. What does preventive care include?  A yearly physical exam. This is also called an annual well check.  Dental exams once or twice a year.  Routine eye exams. Ask your health care provider how often you should have your eyes checked.  Personal lifestyle choices, including:  Daily care of your teeth and gums.  Regular physical activity.  Eating a healthy diet.  Avoiding tobacco and drug use.  Limiting alcohol use.  Practicing safe sex.  Taking low doses of aspirin every day.  Taking vitamin and mineral supplements as recommended by your health care provider. What happens during an annual well check? The services and screenings done by your health care provider during your annual well check will depend on your age, overall health, lifestyle risk factors, and family history of disease. Counseling  Your health care provider may  ask you questions about your:  Alcohol use.  Tobacco use.  Drug use.  Emotional well-being.  Home and relationship well-being.  Sexual activity.  Eating habits.  History of falls.  Memory and ability to understand (cognition).  Work and work Statistician. Screening  You may have the following tests or measurements:  Height, weight, and BMI.  Blood pressure.  Lipid and cholesterol levels. These may be checked every 5 years, or more frequently if you are over 61 years old.  Skin check.  Lung cancer screening. You may have this screening every year starting at age 62 if you have a 30-pack-year history of smoking and currently smoke or have quit within the past 15 years.  Fecal occult blood test (FOBT) of the stool. You may have this test every year starting at age 6.  Flexible sigmoidoscopy or colonoscopy. You may have a sigmoidoscopy every 5 years or a colonoscopy every 10 years starting at age 27.  Prostate cancer screening. Recommendations will vary depending on your family history and other risks.  Hepatitis C blood test.  Hepatitis B blood test.  Sexually transmitted disease (STD) testing.  Diabetes screening. This is done by checking your blood sugar (glucose) after you have not eaten for a while (fasting). You may have this done every 1-3 years.  Abdominal aortic aneurysm (AAA) screening. You may need this if you are a current or former smoker.  Osteoporosis. You may be screened starting at age 51 if you are at high risk. Talk with your health care provider about your test results, treatment options, and if necessary, the need for more tests. Vaccines  Your health  care provider may recommend certain vaccines, such as:  Influenza vaccine. This is recommended every year.  Tetanus, diphtheria, and acellular pertussis (Tdap, Td) vaccine. You may need a Td booster every 10 years.  Zoster vaccine. You may need this after age 23.  Pneumococcal 13-valent  conjugate (PCV13) vaccine. One dose is recommended after age 79.  Pneumococcal polysaccharide (PPSV23) vaccine. One dose is recommended after age 70. Talk to your health care provider about which screenings and vaccines you need and how often you need them. This information is not intended to replace advice given to you by your health care provider. Make sure you discuss any questions you have with your health care provider. Document Released: 10/28/2015 Document Revised: 06/20/2016 Document Reviewed: 08/02/2015 Elsevier Interactive Patient Education  2017 [place] Prevention in the Home Falls can cause injuries. They can happen to people of all ages. There are many things you can do to make your home safe and to help prevent falls. What can I do on the outside of my home?  Regularly fix the edges of walkways and driveways and fix any cracks.  Remove anything that might make you trip as you walk through a door, such as a raised step or threshold.  Trim any bushes or trees on the path to your home.  Use bright outdoor lighting.  Clear any walking paths of anything that might make someone trip, such as rocks or tools.  Regularly check to see if handrails are loose or broken. Make sure that both sides of any steps have handrails.  Any raised decks and porches should have guardrails on the edges.  Have any leaves, snow, or ice cleared regularly.  Use sand or salt on walking paths during winter.  Clean up any spills in your garage right away. This includes oil or grease spills. What can I do in the bathroom?  Use night lights.  Install grab bars by the toilet and in the tub and shower. Do not use towel bars as grab bars.  Use non-skid mats or decals in the tub or shower.  If you need to sit down in the shower, use a plastic, non-slip stool.  Keep the floor dry. Clean up any water that spills on the floor as soon as it happens.  Remove soap buildup in the tub or  shower regularly.  Attach bath mats securely with double-sided non-slip rug tape.  Do not have throw rugs and other things on the floor that can make you trip. What can I do in the bedroom?  Use night lights.  Make sure that you have a light by your bed that is easy to reach.  Do not use any sheets or blankets that are too big for your bed. They should not hang down onto the floor.  Have a firm chair that has side arms. You can use this for support while you get dressed.  Do not have throw rugs and other things on the floor that can make you trip. What can I do in the kitchen?  Clean up any spills right away.  Avoid walking on wet floors.  Keep items that you use a lot in easy-to-reach places.  If you need to reach something above you, use a strong step stool that has a grab bar.  Keep electrical cords out of the way.  Do not use floor polish or wax that makes floors slippery. If you must use wax, use non-skid floor wax.  Do not have  throw rugs and other things on the floor that can make you trip. What can I do with my stairs?  Do not leave any items on the stairs.  Make sure that there are handrails on both sides of the stairs and use them. Fix handrails that are broken or loose. Make sure that handrails are as long as the stairways.  Check any carpeting to make sure that it is firmly attached to the stairs. Fix any carpet that is loose or worn.  Avoid having throw rugs at the top or bottom of the stairs. If you do have throw rugs, attach them to the floor with carpet tape.  Make sure that you have a light switch at the top of the stairs and the bottom of the stairs. If you do not have them, ask someone to add them for you. What else can I do to help prevent falls?  Wear shoes that:  Do not have high heels.  Have rubber bottoms.  Are comfortable and fit you well.  Are closed at the toe. Do not wear sandals.  If you use a stepladder:  Make sure that it is fully  opened. Do not climb a closed stepladder.  Make sure that both sides of the stepladder are locked into place.  Ask someone to hold it for you, if possible.  Clearly mark and make sure that you can see:  Any grab bars or handrails.  First and last steps.  Where the edge of each step is.  Use tools that help you move around (mobility aids) if they are needed. These include:  Canes.  Walkers.  Scooters.  Crutches.  Turn on the lights when you go into a dark area. Replace any light bulbs as soon as they burn out.  Set up your furniture so you have a clear path. Avoid moving your furniture around.  If any of your floors are uneven, fix them.  If there are any pets around you, be aware of where they are.  Review your medicines with your doctor. Some medicines can make you feel dizzy. This can increase your chance of falling. Ask your doctor what other things that you can do to help prevent falls. This information is not intended to replace advice given to you by your health care provider. Make sure you discuss any questions you have with your health care provider. Document Released: 07/28/2009 Document Revised: 03/08/2016 Document Reviewed: 11/05/2014 Elsevier Interactive Patient Education  2017 [employer].

## 2018-09-08 ENCOUNTER — Other Ambulatory Visit: Payer: Self-pay | Admitting: Family Medicine

## 2018-09-08 DIAGNOSIS — N189 Chronic kidney disease, unspecified: Secondary | ICD-10-CM | POA: Diagnosis not present

## 2018-09-08 DIAGNOSIS — E782 Mixed hyperlipidemia: Secondary | ICD-10-CM | POA: Diagnosis not present

## 2018-09-08 DIAGNOSIS — I10 Essential (primary) hypertension: Secondary | ICD-10-CM

## 2018-09-08 DIAGNOSIS — K219 Gastro-esophageal reflux disease without esophagitis: Secondary | ICD-10-CM | POA: Diagnosis not present

## 2018-09-08 DIAGNOSIS — I480 Paroxysmal atrial fibrillation: Secondary | ICD-10-CM | POA: Diagnosis not present

## 2018-09-08 MED ORDER — METOPROLOL TARTRATE 25 MG PO TABS: 12.5000 mg | ORAL_TABLET | Freq: Three times a day (TID) | ORAL | Status: DC

## 2018-09-08 NOTE — Progress Notes (Signed)
Tested BP at home over the weekend and found elevations each morning up to 163/68 to 193/71 each morning. Took an extra 12.5 mg of Metoprolol Tartrate mid day and levels came down by afternoon. Will continue the Hytrin 5 mg q HS with this and continue to monitor BP with the increase of Metoprolol Tartrate 12.5 mg TID. Call report of progress in 2 weeks to be sure further adjustments are not needed. Restrict salt and caffeine intake.

## 2018-09-09 LAB — COMPREHENSIVE METABOLIC PANEL
A/G RATIO: 1.6 (ref 1.2–2.2)
ALBUMIN: 4.2 g/dL (ref 3.5–4.7)
ALT: 11 IU/L (ref 0–44)
AST: 17 IU/L (ref 0–40)
Alkaline Phosphatase: 78 IU/L (ref 39–117)
BILIRUBIN TOTAL: 0.6 mg/dL (ref 0.0–1.2)
BUN/Creatinine Ratio: 15 (ref 10–24)
BUN: 14 mg/dL (ref 8–27)
CHLORIDE: 106 mmol/L (ref 96–106)
CO2: 22 mmol/L (ref 20–29)
Calcium: 9 mg/dL (ref 8.6–10.2)
Creatinine, Ser: 0.95 mg/dL (ref 0.76–1.27)
GFR, EST AFRICAN AMERICAN: 85 mL/min/{1.73_m2} (ref >59)
GFR, EST NON AFRICAN AMERICAN: 74 mL/min/{1.73_m2} (ref >59)
GLOBULIN, TOTAL: 2.6 g/dL (ref 1.5–4.5)
Glucose: 106 mg/dL — ABNORMAL HIGH (ref 65–99)
POTASSIUM: 4 mmol/L (ref 3.5–5.2)
Sodium: 142 mmol/L (ref 134–144)
TOTAL PROTEIN: 6.8 g/dL (ref 6.0–8.5)

## 2018-09-09 LAB — CBC WITH DIFFERENTIAL/PLATELET
Basophils Absolute: 0.1 10*3/uL (ref 0.0–0.2)
Basos: 1 %
EOS (ABSOLUTE): 0.8 10*3/uL — ABNORMAL HIGH (ref 0.0–0.4)
EOS: 11 %
HEMATOCRIT: 41.9 % (ref 37.5–51.0)
HEMOGLOBIN: 14.4 g/dL (ref 13.0–17.7)
IMMATURE GRANS (ABS): 0 10*3/uL (ref 0.0–0.1)
Immature Granulocytes: 0 %
LYMPHS ABS: 1.9 10*3/uL (ref 0.7–3.1)
Lymphs: 25 %
MCH: 30.6 pg (ref 26.6–33.0)
MCHC: 34.4 g/dL (ref 31.5–35.7)
MCV: 89 fL (ref 79–97)
MONOCYTES: 8 %
Monocytes Absolute: 0.6 10*3/uL (ref 0.1–0.9)
NEUTROS ABS: 4.2 10*3/uL (ref 1.4–7.0)
Neutrophils: 55 %
Platelets: 199 10*3/uL (ref 150–450)
RBC: 4.7 x10E6/uL (ref 4.14–5.80)
RDW: 12.4 % (ref 12.3–15.4)
WBC: 7.6 10*3/uL (ref 3.4–10.8)

## 2018-09-09 LAB — LIPID PANEL
CHOL/HDL RATIO: 2.8 ratio (ref 0.0–5.0)
Cholesterol, Total: 150 mg/dL (ref 100–199)
HDL: 54 mg/dL (ref >39)
LDL Calculated: 83 mg/dL (ref 0–99)
Triglycerides: 67 mg/dL (ref 0–149)
VLDL CHOLESTEROL CAL: 13 mg/dL (ref 5–40)

## 2018-09-09 LAB — TSH: TSH: 5.14 u[IU]/mL — AB (ref 0.450–4.500)

## 2018-09-15 ENCOUNTER — Encounter: Payer: Self-pay | Admitting: Physician Assistant

## 2018-09-15 ENCOUNTER — Telehealth: Payer: Self-pay | Admitting: *Deleted

## 2018-09-15 ENCOUNTER — Ambulatory Visit (INDEPENDENT_AMBULATORY_CARE_PROVIDER_SITE_OTHER): Payer: PPO | Admitting: Physician Assistant

## 2018-09-15 VITALS — BP 160/70 | HR 77 | Temp 98.4°F | Resp 16 | Wt 157.8 lb

## 2018-09-15 DIAGNOSIS — J069 Acute upper respiratory infection, unspecified: Secondary | ICD-10-CM

## 2018-09-15 DIAGNOSIS — J029 Acute pharyngitis, unspecified: Secondary | ICD-10-CM

## 2018-09-15 DIAGNOSIS — B9789 Other viral agents as the cause of diseases classified elsewhere: Secondary | ICD-10-CM | POA: Diagnosis not present

## 2018-09-15 LAB — POCT INFLUENZA A/B
Influenza A, POC: NEGATIVE
Influenza B, POC: NEGATIVE

## 2018-09-15 LAB — POCT RAPID STREP A (OFFICE): Rapid Strep A Screen: NEGATIVE

## 2018-09-15 NOTE — Telephone Encounter (Signed)
-----   Message from Margo Common, [place] sent at 09/12/2018 12:54 PM EST ----- I don't expect the minor changes in blood sugar and TSH to be an issue. Will have to monitor for further changes or progression. 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 If back discomfort persists, may need to recheck MRI scan to see if this is from the fibrosis.

## 2018-09-15 NOTE — Telephone Encounter (Signed)
LMOVM for pt to return call 

## 2018-09-15 NOTE — Progress Notes (Signed)
Patient: Mark Sanchez. Male    DOB: 19-Jan-1933   84 y.o.   MRN: [id number] Visit Date: 09/15/2018  Today's Provider: Trinna Post, PA-C   Chief Complaint  Patient presents with  . URI   Subjective:    URI   This is a new problem. The current episode started in the past 7 days (Friday night his thoat was sore). The problem has been gradually worsening. There has been no fever. Associated symptoms include congestion, coughing ("a little") and a sore throat. Pertinent negatives include no chest pain, headaches, sinus pain or wheezing. He has tried antihistamine (gargles with salt water and a mouth wash. Vitamin C drops) for the symptoms. The treatment provided no relief.   Patient has a history of paroxysmal Afib, copd and sick contacts.      Allergies  Allergen Reactions  . Iodinated Diagnostic Agents Other (See Comments)  . Sulfa Antibiotics Itching and Rash    Allergic reaction was over 30years ago. Patient unsure of exact reaction     Current Outpatient Medications:  .  aspirin 81 MG tablet, Take 81 mg by mouth daily., Disp: , Rfl:  .  Brinzolamide-Brimonidine (SIMBRINZA) 1-0.2 % SUSP, Apply 3 drops daily to eye. , Disp: , Rfl:  .  carboxymethylcellulose (REFRESH PLUS) 0.5 % SOLN, Place 1 drop into both eyes 4 (four) times daily., Disp: , Rfl:  .  cetirizine (ZYRTEC) 10 MG tablet, Take 10 mg by mouth daily., Disp: , Rfl:  .  fluticasone (FLONASE) 50 MCG/ACT nasal spray, TAKE TWO PUFFS EACH NOSTRIL DAILY., Disp: 48 g, Rfl: 3 .  HYDROcodone-acetaminophen (NORCO) 10-325 MG tablet, Take 1 tablet by mouth 4 (four) times daily as needed. Reviewed controlled substance record from PMP Alert., Disp: 100 tablet, Rfl: 0 .  latanoprost (XALATAN) 0.005 % ophthalmic solution, Place 1 drop at bedtime into both eyes., Disp: , Rfl:  .  metoprolol tartrate (LOPRESSOR) 25 MG tablet, Take 0.5 tablets (12.5 mg total) by mouth 3 (three) times daily with meals., Disp: , Rfl:  .   mometasone (ASMANEX) 220 MCG/INH inhaler, Inhale 1 puff into the lungs daily. , Disp: , Rfl:  .  Multiple Vitamin (MULTI-VITAMINS) TABS, Take daily by mouth. , Disp: , Rfl:  .  Multiple Vitamins-Minerals (PRESERVISION AREDS 2 PO), Take by mouth., Disp: , Rfl:  .  Netarsudil Dimesylate 0.02 % SOLN, Apply 1 drop to eye at bedtime., Disp: , Rfl:  .  omeprazole (PRILOSEC) 20 MG capsule, Take 1 capsule (20 mg total) by mouth daily., Disp: 90 capsule, Rfl: 1 .  pravastatin (PRAVACHOL) 20 MG tablet, Take 20 mg by mouth daily. , Disp: , Rfl:  .  terazosin (HYTRIN) 5 MG capsule, Take 5 mg by mouth at bedtime., Disp: , Rfl:  .  timolol (BETIMOL) 0.25 % ophthalmic solution, 1-2 drops 2 (two) times daily., Disp: , Rfl:   Review of Systems  Constitutional: Positive for chills. Negative for fever.  HENT: Positive for congestion, postnasal drip and sore throat. Negative for sinus pressure, sinus pain and trouble swallowing.   Respiratory: Positive for cough ("a little"). Negative for chest tightness, shortness of breath and wheezing.   Cardiovascular: Negative for chest pain, palpitations and leg swelling.  Neurological: Negative for headaches.    Social History   Tobacco Use  . Smoking status: Former Smoker    Types: Cigars    Last attempt to quit: 10/15/1999    Years since quitting: 18.9  .  Smokeless tobacco: Never Used  . Tobacco comment: quit about 15 years ago  Substance Use Topics  . Alcohol use: Yes    Comment: occasionally, 1-2 drinks monthly   Objective:   BP (!) 160/70 (BP Location: Left Arm, Patient Position: Sitting, Cuff Size: Normal)   Pulse 77   Temp 98.4 F (36.9 C) (Oral)   Resp 16   Wt 157 lb 12.8 oz (71.6 kg)   SpO2 97%   BMI 22.64 kg/m  Vitals:   09/15/18 1337  BP: (!) 160/70  Pulse: 77  Resp: 16  Temp: 98.4 F (36.9 C)  TempSrc: Oral  SpO2: 97%  Weight: 157 lb 12.8 oz (71.6 kg)     Physical Exam  Constitutional: He is oriented to person, place, and time. He  appears well-developed and well-nourished.  HENT:  Right Ear: External ear normal.  Left Ear: External ear normal.  Nose: Nose normal.  Mouth/Throat: Posterior oropharyngeal edema and posterior oropharyngeal erythema present. No oropharyngeal exudate.  Slightly erythematous external auditory canals.   Cardiovascular: Normal rate and regular rhythm.  Pulmonary/Chest: Effort normal and breath sounds normal.  Lymphadenopathy:    He has no cervical adenopathy.  Neurological: He is alert and oriented to person, place, and time.  Skin: Skin is warm and dry.  Psychiatric: He has a normal mood and affect. His behavior is normal.        Assessment & Plan:     1. Sore throat  Rapid strep and rapid flu negative. Appears viral. Please call if worsening and will send in antibiotics. Continue with mucinex, fluids, and mouthwash.   - POCT rapid strep A - POCT Influenza A/B  2. Viral URI with cough  - POCT Influenza A/B  No follow-ups on file.  The entirety of the information documented in the History of Present Illness, Review of Systems and Physical Exam were personally obtained by me. Portions of this information were initially documented by Lyndel Pleasure , CMA and reviewed by me for thoroughness and accuracy.         Trinna Post, PA-C  [facility]

## 2018-09-15 NOTE — Patient Instructions (Signed)

## 2018-09-17 ENCOUNTER — Ambulatory Visit (INDEPENDENT_AMBULATORY_CARE_PROVIDER_SITE_OTHER): Payer: PPO | Admitting: Physician Assistant

## 2018-09-17 ENCOUNTER — Encounter: Payer: Self-pay | Admitting: Physician Assistant

## 2018-09-17 VITALS — BP 148/70 | HR 80 | Temp 98.4°F | Resp 16

## 2018-09-17 DIAGNOSIS — J441 Chronic obstructive pulmonary disease with (acute) exacerbation: Secondary | ICD-10-CM | POA: Diagnosis not present

## 2018-09-17 DIAGNOSIS — H109 Unspecified conjunctivitis: Secondary | ICD-10-CM | POA: Diagnosis not present

## 2018-09-17 MED ORDER — PREDNISONE 20 MG PO TABS: 20.0000 mg | ORAL_TABLET | Freq: Two times a day (BID) | ORAL | 0 refills | Status: AC

## 2018-09-17 MED ORDER — DOXYCYCLINE HYCLATE 100 MG PO TABS: 100.0000 mg | ORAL_TABLET | Freq: Two times a day (BID) | ORAL | 0 refills | Status: AC

## 2018-09-17 MED ORDER — OFLOXACIN 0.3 % OP SOLN: 1.0000 [drp] | Freq: Four times a day (QID) | OPHTHALMIC | 0 refills | Status: AC

## 2018-09-17 NOTE — Progress Notes (Signed)
Patient: Mark Sanchez. Male    DOB: Mar 04, 1933   84 y.o.   MRN: [id number] Visit Date: 09/18/2018  Today's Provider: Trinna Post, PA-C   Chief Complaint  Patient presents with  . Conjunctivitis   Subjective:    HPI Patient has a history of COPD and reports today that he is not feeling any better from his respiratory illness. He reports feeling more congested and coughing more frequently. Denies fevers, chills. He also reports he woke up with two red eyes and green discharge on his eyes. Patient reports that he had some left over eye drops, ciprofloxacin, which he used.      Allergies  Allergen Reactions  . Iodinated Diagnostic Agents Other (See Comments)  . Sulfa Antibiotics Itching and Rash    Allergic reaction was over 30years ago. Patient unsure of exact reaction     Current Outpatient Medications:  .  aspirin 81 MG tablet, Take 81 mg by mouth daily., Disp: , Rfl:  .  Brinzolamide-Brimonidine (SIMBRINZA) 1-0.2 % SUSP, Apply 3 drops daily to eye. , Disp: , Rfl:  .  carboxymethylcellulose (REFRESH PLUS) 0.5 % SOLN, Place 1 drop into both eyes 4 (four) times daily., Disp: , Rfl:  .  cetirizine (ZYRTEC) 10 MG tablet, Take 10 mg by mouth daily., Disp: , Rfl:  .  fluticasone (FLONASE) 50 MCG/ACT nasal spray, TAKE TWO PUFFS EACH NOSTRIL DAILY., Disp: 48 g, Rfl: 3 .  HYDROcodone-acetaminophen (NORCO) 10-325 MG tablet, Take 1 tablet by mouth 4 (four) times daily as needed. Reviewed controlled substance record from PMP Alert., Disp: 100 tablet, Rfl: 0 .  latanoprost (XALATAN) 0.005 % ophthalmic solution, Place 1 drop at bedtime into both eyes., Disp: , Rfl:  .  metoprolol tartrate (LOPRESSOR) 25 MG tablet, Take 0.5 tablets (12.5 mg total) by mouth 3 (three) times daily with meals., Disp: , Rfl:  .  mometasone (ASMANEX) 220 MCG/INH inhaler, Inhale 1 puff into the lungs daily. , Disp: , Rfl:  .  Multiple Vitamin (MULTI-VITAMINS) TABS, Take daily by mouth. , Disp: , Rfl:    .  Multiple Vitamins-Minerals (PRESERVISION AREDS 2 PO), Take by mouth., Disp: , Rfl:  .  Netarsudil Dimesylate 0.02 % SOLN, Apply 1 drop to eye at bedtime., Disp: , Rfl:  .  omeprazole (PRILOSEC) 20 MG capsule, Take 1 capsule (20 mg total) by mouth daily., Disp: 90 capsule, Rfl: 1 .  pravastatin (PRAVACHOL) 20 MG tablet, Take 20 mg by mouth daily. , Disp: , Rfl:  .  terazosin (HYTRIN) 5 MG capsule, Take 5 mg by mouth at bedtime., Disp: , Rfl:  .  timolol (BETIMOL) 0.25 % ophthalmic solution, 1-2 drops 2 (two) times daily., Disp: , Rfl:  .  doxycycline (VIBRA-TABS) 100 MG tablet, Take 1 tablet (100 mg total) by mouth 2 (two) times daily for 7 days., Disp: 14 tablet, Rfl: 0 .  ofloxacin (OCUFLOX) 0.3 % ophthalmic solution, Place 1 drop into both eyes 4 (four) times daily for 7 days., Disp: 1.4 mL, Rfl: 0 .  predniSONE (DELTASONE) 20 MG tablet, Take 1 tablet (20 mg total) by mouth 2 (two) times daily with a meal for 5 days., Disp: 10 tablet, Rfl: 0  Review of Systems  Social History   Tobacco Use  . Smoking status: Former Smoker    Types: Cigars    Last attempt to quit: 10/15/1999    Years since quitting: 18.9  . Smokeless tobacco: Never Used  .  Tobacco comment: quit about 15 years ago  Substance Use Topics  . Alcohol use: Yes    Comment: occasionally, 1-2 drinks monthly   Objective:   BP (!) 148/70 (BP Location: Right Arm, Patient Position: Sitting, Cuff Size: Normal)   Pulse 80   Temp 98.4 F (36.9 C) (Oral)   Resp 16   SpO2 98%  Vitals:   09/17/18 1437  BP: (!) 148/70  Pulse: 80  Resp: 16  Temp: 98.4 F (36.9 C)  TempSrc: Oral  SpO2: 98%     Physical Exam  Constitutional: He is oriented to person, place, and time. He appears well-developed and well-nourished.  Eyes: Right eye exhibits discharge. Left eye exhibits discharge. Right conjunctiva is injected. Left conjunctiva is injected.  Mucopurulent discharge in both eyes.   Cardiovascular: Normal rate and regular  rhythm.  Pulmonary/Chest: Effort normal. He has wheezes.  Neurological: He is alert and oriented to person, place, and time.  Skin: Skin is warm and dry.  Psychiatric: He has a normal mood and affect. His behavior is normal.        Assessment & Plan:     1. COPD exacerbation (HCC)  - doxycycline (VIBRA-TABS) 100 MG tablet; Take 1 tablet (100 mg total) by mouth 2 (two) times daily for 7 days.  Dispense: 14 tablet; Refill: 0 - predniSONE (DELTASONE) 20 MG tablet; Take 1 tablet (20 mg total) by mouth 2 (two) times daily with a meal for 5 days.  Dispense: 10 tablet; Refill: 0  2. Bacterial conjunctivitis  - ofloxacin (OCUFLOX) 0.3 % ophthalmic solution; Place 1 drop into both eyes 4 (four) times daily for 7 days.  Dispense: 1.4 mL; Refill: 0  The entirety of the information documented in the History of Present Illness, Review of Systems and Physical Exam were personally obtained by me. Portions of this information were initially documented by Lyndel Pleasure, CMA and reviewed by me for thoroughness and accuracy.   Return if symptoms worsen or fail to improve.         Trinna Post, PA-C  [facility]

## 2018-09-17 NOTE — Patient Instructions (Addendum)
  You have pink eye, this is very contagious. Take eye drops and be sure to wash pillow cases.

## 2018-09-18 NOTE — Telephone Encounter (Signed)
LMOVM for pt to return call 

## 2018-09-30 NOTE — Telephone Encounter (Signed)
LMOVM for pt to return call 

## 2018-10-03 NOTE — Telephone Encounter (Signed)
Patient was been notified of results.

## 2018-11-03 DIAGNOSIS — I480 Paroxysmal atrial fibrillation: Secondary | ICD-10-CM | POA: Diagnosis not present

## 2018-11-03 DIAGNOSIS — I351 Nonrheumatic aortic (valve) insufficiency: Secondary | ICD-10-CM | POA: Diagnosis not present

## 2018-11-03 DIAGNOSIS — E782 Mixed hyperlipidemia: Secondary | ICD-10-CM | POA: Diagnosis not present

## 2018-11-03 DIAGNOSIS — I119 Hypertensive heart disease without heart failure: Secondary | ICD-10-CM | POA: Insufficient documentation

## 2018-12-08 DIAGNOSIS — X32XXXA Exposure to sunlight, initial encounter: Secondary | ICD-10-CM | POA: Diagnosis not present

## 2018-12-08 DIAGNOSIS — D2262 Melanocytic nevi of left upper limb, including shoulder: Secondary | ICD-10-CM | POA: Diagnosis not present

## 2018-12-08 DIAGNOSIS — D2272 Melanocytic nevi of left lower limb, including hip: Secondary | ICD-10-CM | POA: Diagnosis not present

## 2018-12-08 DIAGNOSIS — L570 Actinic keratosis: Secondary | ICD-10-CM | POA: Diagnosis not present

## 2018-12-08 DIAGNOSIS — D2271 Melanocytic nevi of right lower limb, including hip: Secondary | ICD-10-CM | POA: Diagnosis not present

## 2018-12-08 DIAGNOSIS — D225 Melanocytic nevi of trunk: Secondary | ICD-10-CM | POA: Diagnosis not present

## 2018-12-08 DIAGNOSIS — D2261 Melanocytic nevi of right upper limb, including shoulder: Secondary | ICD-10-CM | POA: Diagnosis not present

## 2019-01-07 ENCOUNTER — Telehealth: Payer: Self-pay

## 2019-01-07 MED ORDER — OMEPRAZOLE 20 MG PO CPDR: 20.0000 mg | DELAYED_RELEASE_CAPSULE | Freq: Every day | ORAL | 1 refills | Status: DC

## 2019-01-07 NOTE — Telephone Encounter (Signed)
RX for omeprazole sent to [facility].   Thanks,   -Mickel Baas

## 2019-05-04 DIAGNOSIS — I119 Hypertensive heart disease without heart failure: Secondary | ICD-10-CM | POA: Diagnosis not present

## 2019-05-04 DIAGNOSIS — I071 Rheumatic tricuspid insufficiency: Secondary | ICD-10-CM | POA: Diagnosis not present

## 2019-05-04 DIAGNOSIS — I480 Paroxysmal atrial fibrillation: Secondary | ICD-10-CM | POA: Diagnosis not present

## 2019-05-04 DIAGNOSIS — I10 Essential (primary) hypertension: Secondary | ICD-10-CM | POA: Diagnosis not present

## 2019-05-04 DIAGNOSIS — I351 Nonrheumatic aortic (valve) insufficiency: Secondary | ICD-10-CM | POA: Diagnosis not present

## 2019-05-04 DIAGNOSIS — E782 Mixed hyperlipidemia: Secondary | ICD-10-CM | POA: Diagnosis not present

## 2019-05-04 DIAGNOSIS — I340 Nonrheumatic mitral (valve) insufficiency: Secondary | ICD-10-CM | POA: Diagnosis not present

## 2019-06-18 ENCOUNTER — Telehealth: Payer: Self-pay

## 2019-06-18 NOTE — Telephone Encounter (Signed)
FYI

## 2019-06-18 NOTE — Telephone Encounter (Signed)
Patients wife called requesting an appointment to see Dr. Rosanna Randy. 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 She thinks he is having a flare up again. Symptoms include excessive burping, back pain, weight loss, indigestion, constipation and no appetite. She made an appointment for patient to be seen 06/23/2019. 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 Patients wife wanted a message sent to Dr. Rosanna Randy to make him aware of these symptoms.

## 2019-06-23 ENCOUNTER — Other Ambulatory Visit: Payer: Self-pay

## 2019-06-23 ENCOUNTER — Encounter: Payer: Self-pay | Admitting: Family Medicine

## 2019-06-23 ENCOUNTER — Ambulatory Visit (INDEPENDENT_AMBULATORY_CARE_PROVIDER_SITE_OTHER): Payer: PPO | Admitting: Family Medicine

## 2019-06-23 VITALS — BP 155/70 | HR 68 | Temp 97.3°F | Resp 16 | Wt 156.2 lb

## 2019-06-23 DIAGNOSIS — R5383 Other fatigue: Secondary | ICD-10-CM

## 2019-06-23 DIAGNOSIS — K219 Gastro-esophageal reflux disease without esophagitis: Secondary | ICD-10-CM | POA: Diagnosis not present

## 2019-06-23 DIAGNOSIS — I480 Paroxysmal atrial fibrillation: Secondary | ICD-10-CM

## 2019-06-23 DIAGNOSIS — I10 Essential (primary) hypertension: Secondary | ICD-10-CM | POA: Diagnosis not present

## 2019-06-23 DIAGNOSIS — R634 Abnormal weight loss: Secondary | ICD-10-CM

## 2019-06-23 DIAGNOSIS — Z87898 Personal history of other specified conditions: Secondary | ICD-10-CM

## 2019-06-23 DIAGNOSIS — E039 Hypothyroidism, unspecified: Secondary | ICD-10-CM

## 2019-06-23 DIAGNOSIS — E782 Mixed hyperlipidemia: Secondary | ICD-10-CM

## 2019-06-23 DIAGNOSIS — Z87448 Personal history of other diseases of urinary system: Secondary | ICD-10-CM

## 2019-06-23 MED ORDER — PANTOPRAZOLE SODIUM 40 MG PO TBEC: 40.0000 mg | DELAYED_RELEASE_TABLET | Freq: Two times a day (BID) | ORAL | 1 refills | Status: DC

## 2019-06-23 NOTE — Progress Notes (Signed)
Patient: Mark Sanchez. Male    DOB: [birth year]   85 y.o.   MRN: [id number] Visit Date: 06/23/2019  Today's Provider: Wilhemena Durie, MD   Chief Complaint  Patient presents with  . Gastroesophageal Reflux   Subjective:     Patient here today c/o indigestion, abdominal bloating and weight loss. Patient reports that he was diagnosed with retroperitoneal fibrosis.   Gastroesophageal Reflux He complains of belching, chest pain and heartburn. This is a recurrent problem. The current episode started more than 1 month ago. The problem occurs occasionally. The problem has been unchanged. The heartburn duration is more than one hour. The heartburn is located in the substernum. The heartburn wakes him from sleep. Nothing aggravates the symptoms. Associated symptoms include weight loss. He has tried an antacid for the symptoms. The treatment provided moderate relief.   Patient here today Allergies  Allergen Reactions  . Iodinated Diagnostic Agents Other (See Comments)  . Sulfa Antibiotics Itching and Rash    Allergic reaction was over 30years ago. Patient unsure of exact reaction     Current Outpatient Medications:  .  aspirin 81 MG tablet, Take 81 mg by mouth daily., Disp: , Rfl:  .  Brinzolamide-Brimonidine (SIMBRINZA) 1-0.2 % SUSP, Apply 3 drops daily to eye. , Disp: , Rfl:  .  carboxymethylcellulose (REFRESH PLUS) 0.5 % SOLN, Place 1 drop into both eyes 4 (four) times daily., Disp: , Rfl:  .  cetirizine (ZYRTEC) 10 MG tablet, Take 10 mg by mouth daily., Disp: , Rfl:  .  fluticasone (FLONASE) 50 MCG/ACT nasal spray, TAKE TWO PUFFS EACH NOSTRIL DAILY., Disp: 48 g, Rfl: 3 .  HYDROcodone-acetaminophen (NORCO) 10-325 MG tablet, Take 1 tablet by mouth 4 (four) times daily as needed. Reviewed controlled substance record from PMP Alert., Disp: 100 tablet, Rfl: 0 .  latanoprost (XALATAN) 0.005 % ophthalmic solution, Place 1 drop at bedtime into both eyes., Disp: , Rfl:  .   metoprolol tartrate (LOPRESSOR) 25 MG tablet, Take 0.5 tablets (12.5 mg total) by mouth 3 (three) times daily with meals., Disp: , Rfl:  .  mometasone (ASMANEX) 220 MCG/INH inhaler, Inhale 1 puff into the lungs daily. , Disp: , Rfl:  .  Multiple Vitamin (MULTI-VITAMINS) TABS, Take daily by mouth. , Disp: , Rfl:  .  Multiple Vitamins-Minerals (PRESERVISION AREDS 2 PO), Take by mouth., Disp: , Rfl:  .  Netarsudil Dimesylate 0.02 % SOLN, Apply 1 drop to eye at bedtime., Disp: , Rfl:  .  omeprazole (PRILOSEC) 20 MG capsule, Take 1 capsule (20 mg total) by mouth daily., Disp: 90 capsule, Rfl: 1 .  polyethylene glycol (MIRALAX / GLYCOLAX) 17 g packet, Take 17 g by mouth daily., Disp: , Rfl:  .  pravastatin (PRAVACHOL) 20 MG tablet, Take 20 mg by mouth daily. , Disp: , Rfl:  .  terazosin (HYTRIN) 5 MG capsule, Take 5 mg by mouth at bedtime., Disp: , Rfl:  .  timolol (BETIMOL) 0.25 % ophthalmic solution, 1-2 drops 2 (two) times daily., Disp: , Rfl:  .  Wheat Dextrin (BENEFIBER PO), Take by mouth., Disp: , Rfl:   Review of Systems  Constitutional: Positive for appetite change, unexpected weight change and weight loss.  Eyes: Negative.   Respiratory: Negative.   Cardiovascular: Positive for chest pain.  Gastrointestinal: Positive for abdominal distention, constipation and heartburn.  Endocrine: Negative.   Musculoskeletal: Positive for arthralgias.  Allergic/Immunologic: Negative.   Hematological: Negative.   Psychiatric/Behavioral: Negative.  Social History   Tobacco Use  . Smoking status: Former Smoker    Types: Cigars    Quit date: 10/15/1999    Years since quitting: 19.7  . Smokeless tobacco: Never Used  . Tobacco comment: quit about 15 years ago  Substance Use Topics  . Alcohol use: Yes    Comment: occasionally, 1-2 drinks monthly      Objective:   BP (!) 155/70 (BP Location: Left Arm, Patient Position: Sitting, Cuff Size: Normal)   Pulse 68   Temp (!) 97.3 F (36.3 C)  (Temporal)   Resp 16   Wt 156 lb 3.2 oz (70.9 kg)   BMI 22.41 kg/m  Vitals:   06/23/19 1612  BP: (!) 155/70  Pulse: 68  Resp: 16  Temp: (!) 97.3 F (36.3 C)  TempSrc: Temporal  Weight: 156 lb 3.2 oz (70.9 kg)  Body mass index is 22.41 kg/m.   Physical Exam Vitals signs reviewed.  Constitutional:      Appearance: Normal appearance.  HENT:     Head: Normocephalic and atraumatic.     Right Ear: External ear normal.     Left Ear: External ear normal.  Eyes:     General: No scleral icterus.    Conjunctiva/sclera: Conjunctivae normal.  Cardiovascular:     Rate and Rhythm: Normal rate and regular rhythm.     Heart sounds: Normal heart sounds.  Pulmonary:     Effort: Pulmonary effort is normal.     Breath sounds: Normal breath sounds.  Abdominal:     Palpations: Abdomen is soft. There is no mass.  Skin:    General: Skin is warm and dry.  Neurological:     General: No focal deficit present.     Mental Status: He is alert and oriented to person, place, and time.  Psychiatric:        Mood and Affect: Mood normal.        Behavior: Behavior normal.        Thought Content: Thought content normal.        Judgment: Judgment normal.      No results found for any visits on 06/23/19.     Assessment & Plan    1. Essential (primary) hypertension Toprol. - CBC with Differential/Platelet - Comprehensive metabolic panel  2. Mixed hyperlipidemia Pravachol - Comprehensive metabolic panel - Lipid Panel With LDL/HDL Ratio  3. Paroxysmal atrial fibrillation (HCC)   4. Gastroesophageal reflux disease without esophagitis BID protonix. RTC 2-4 weeks. - pantoprazole (PROTONIX) 40 MG tablet; Take 1 tablet (40 mg total) by mouth 2 (two) times daily.  Dispense: 60 tablet; Refill: 1  5. Fatigue, unspecified type  - CBC with Differential/Platelet  6. Hypothyroidism, unspecified type  - TSH  7. Weight loss 10 lbs over past 2 years.  8. History of retroperitoneal fibrosis  Pt /wife worried about this.     Richard Cranford Mon, MD  [facility]

## 2019-06-26 DIAGNOSIS — I10 Essential (primary) hypertension: Secondary | ICD-10-CM | POA: Diagnosis not present

## 2019-06-26 DIAGNOSIS — R5383 Other fatigue: Secondary | ICD-10-CM | POA: Diagnosis not present

## 2019-06-26 DIAGNOSIS — E039 Hypothyroidism, unspecified: Secondary | ICD-10-CM | POA: Diagnosis not present

## 2019-06-26 DIAGNOSIS — E782 Mixed hyperlipidemia: Secondary | ICD-10-CM | POA: Diagnosis not present

## 2019-06-26 MED ORDER — HYDROCODONE-ACETAMINOPHEN 7.5-325 MG PO TABS: 1.0000 | ORAL_TABLET | Freq: Four times a day (QID) | ORAL | 0 refills | Status: DC | PRN

## 2019-06-27 LAB — COMPREHENSIVE METABOLIC PANEL
ALT: 8 IU/L (ref 0–44)
AST: 16 IU/L (ref 0–40)
Albumin/Globulin Ratio: 2.1 (ref 1.2–2.2)
Albumin: 4 g/dL (ref 3.6–4.6)
Alkaline Phosphatase: 67 IU/L (ref 39–117)
BUN/Creatinine Ratio: 17 (ref 10–24)
BUN: 16 mg/dL (ref 8–27)
Bilirubin Total: 0.4 mg/dL (ref 0.0–1.2)
CO2: 20 mmol/L (ref 20–29)
Calcium: 8.6 mg/dL (ref 8.6–10.2)
Chloride: 107 mmol/L — ABNORMAL HIGH (ref 96–106)
Creatinine, Ser: 0.92 mg/dL (ref 0.76–1.27)
GFR calc Af Amer: 88 mL/min/{1.73_m2} (ref >59)
GFR calc non Af Amer: 76 mL/min/{1.73_m2} (ref >59)
Globulin, Total: 1.9 g/dL (ref 1.5–4.5)
Glucose: 98 mg/dL (ref 65–99)
Potassium: 4.1 mmol/L (ref 3.5–5.2)
Sodium: 141 mmol/L (ref 134–144)
Total Protein: 5.9 g/dL — ABNORMAL LOW (ref 6.0–8.5)

## 2019-06-27 LAB — CBC WITH DIFFERENTIAL/PLATELET
Basophils Absolute: 0.1 10*3/uL (ref 0.0–0.2)
Basos: 1 %
EOS (ABSOLUTE): 0.9 10*3/uL — ABNORMAL HIGH (ref 0.0–0.4)
Eos: 12 %
Hematocrit: 41 % (ref 37.5–51.0)
Hemoglobin: 13.9 g/dL (ref 13.0–17.7)
Immature Grans (Abs): 0 10*3/uL (ref 0.0–0.1)
Immature Granulocytes: 0 %
Lymphocytes Absolute: 2.4 10*3/uL (ref 0.7–3.1)
Lymphs: 31 %
MCH: 31.7 pg (ref 26.6–33.0)
MCHC: 33.9 g/dL (ref 31.5–35.7)
MCV: 93 fL (ref 79–97)
Monocytes Absolute: 0.7 10*3/uL (ref 0.1–0.9)
Monocytes: 9 %
Neutrophils Absolute: 3.6 10*3/uL (ref 1.4–7.0)
Neutrophils: 47 %
Platelets: 187 10*3/uL (ref 150–450)
RBC: 4.39 x10E6/uL (ref 4.14–5.80)
RDW: 12.8 % (ref 11.6–15.4)
WBC: 7.6 10*3/uL (ref 3.4–10.8)

## 2019-06-27 LAB — LIPID PANEL WITH LDL/HDL RATIO
Cholesterol, Total: 124 mg/dL (ref 100–199)
HDL: 50 mg/dL (ref >39)
LDL Chol Calc (NIH): 65 mg/dL (ref 0–99)
LDL/HDL Ratio: 1.3 ratio (ref 0.0–3.6)
Triglycerides: 35 mg/dL (ref 0–149)
VLDL Cholesterol Cal: 9 mg/dL (ref 5–40)

## 2019-06-27 LAB — TSH: TSH: 6.59 u[IU]/mL — ABNORMAL HIGH (ref 0.450–4.500)

## 2019-06-29 DIAGNOSIS — H0100A Unspecified blepharitis right eye, upper and lower eyelids: Secondary | ICD-10-CM | POA: Diagnosis not present

## 2019-07-13 DIAGNOSIS — H04553 Acquired stenosis of bilateral nasolacrimal duct: Secondary | ICD-10-CM | POA: Diagnosis not present

## 2019-07-27 ENCOUNTER — Encounter: Payer: Self-pay | Admitting: Family Medicine

## 2019-07-27 ENCOUNTER — Other Ambulatory Visit: Payer: Self-pay

## 2019-07-27 ENCOUNTER — Ambulatory Visit (INDEPENDENT_AMBULATORY_CARE_PROVIDER_SITE_OTHER): Payer: PPO | Admitting: Family Medicine

## 2019-07-27 VITALS — BP 126/66 | HR 67 | Temp 96.9°F | Resp 18 | Wt 157.0 lb

## 2019-07-27 DIAGNOSIS — K219 Gastro-esophageal reflux disease without esophagitis: Secondary | ICD-10-CM

## 2019-07-27 DIAGNOSIS — R5383 Other fatigue: Secondary | ICD-10-CM

## 2019-07-27 DIAGNOSIS — R634 Abnormal weight loss: Secondary | ICD-10-CM

## 2019-07-27 DIAGNOSIS — Z23 Encounter for immunization: Secondary | ICD-10-CM

## 2019-07-27 MED ORDER — PANTOPRAZOLE SODIUM 40 MG PO TBEC: 40.0000 mg | DELAYED_RELEASE_TABLET | Freq: Two times a day (BID) | ORAL | 1 refills | Status: DC

## 2019-07-27 NOTE — Progress Notes (Signed)
Patient: Mark Sanchez. Male    DOB: [birth year]   85 y.o.   MRN: [id number] Visit Date: 07/27/2019  Today's Provider: Wilhemena Durie, MD   Chief Complaint  Patient presents with  . Gastroesophageal Reflux   Subjective:     HPI  One month follow up for GERD. Medication refill on Pantoprazole for 90 days. He feels back to normal and his weight loss has ended.Weight up 1 lb.  Allergies  Allergen Reactions  . Iodinated Diagnostic Agents Other (See Comments)  . Sulfa Antibiotics Itching and Rash    Allergic reaction was over 30years ago. Patient unsure of exact reaction     Current Outpatient Medications:  .  aspirin 81 MG tablet, Take 81 mg by mouth daily., Disp: , Rfl:  .  Brinzolamide-Brimonidine (SIMBRINZA) 1-0.2 % SUSP, Apply 3 drops daily to eye. , Disp: , Rfl:  .  carboxymethylcellulose (REFRESH PLUS) 0.5 % SOLN, Place 1 drop into both eyes 4 (four) times daily., Disp: , Rfl:  .  cetirizine (ZYRTEC) 10 MG tablet, Take 10 mg by mouth daily., Disp: , Rfl:  .  fluticasone (FLONASE) 50 MCG/ACT nasal spray, TAKE TWO PUFFS EACH NOSTRIL DAILY., Disp: 48 g, Rfl: 3 .  HYDROcodone-acetaminophen (NORCO) 10-325 MG tablet, Take 1 tablet by mouth 4 (four) times daily as needed. Reviewed controlled substance record from PMP Alert., Disp: 100 tablet, Rfl: 0 .  HYDROcodone-acetaminophen (NORCO) 7.5-325 MG tablet, Take 1 tablet by mouth every 6 (six) hours as needed for moderate pain., Disp: 30 tablet, Rfl: 0 .  latanoprost (XALATAN) 0.005 % ophthalmic solution, Place 1 drop at bedtime into both eyes., Disp: , Rfl:  .  metoprolol tartrate (LOPRESSOR) 25 MG tablet, Take 0.5 tablets (12.5 mg total) by mouth 3 (three) times daily with meals., Disp: , Rfl:  .  mometasone (ASMANEX) 220 MCG/INH inhaler, Inhale 1 puff into the lungs daily. , Disp: , Rfl:  .  Multiple Vitamin (MULTI-VITAMINS) TABS, Take daily by mouth. , Disp: , Rfl:  .  Multiple Vitamins-Minerals (PRESERVISION AREDS 2  PO), Take by mouth., Disp: , Rfl:  .  Netarsudil Dimesylate 0.02 % SOLN, Apply 1 drop to eye at bedtime., Disp: , Rfl:  .  pantoprazole (PROTONIX) 40 MG tablet, Take 1 tablet (40 mg total) by mouth 2 (two) times daily., Disp: 60 tablet, Rfl: 1 .  polyethylene glycol (MIRALAX / GLYCOLAX) 17 g packet, Take 17 g by mouth daily., Disp: , Rfl:  .  pravastatin (PRAVACHOL) 20 MG tablet, Take 20 mg by mouth daily. , Disp: , Rfl:  .  terazosin (HYTRIN) 5 MG capsule, Take 5 mg by mouth at bedtime., Disp: , Rfl:  .  timolol (BETIMOL) 0.25 % ophthalmic solution, 1-2 drops 2 (two) times daily., Disp: , Rfl:  .  Wheat Dextrin (BENEFIBER PO), Take by mouth., Disp: , Rfl:   Review of Systems  Constitutional: Negative.   HENT: Negative.   Eyes: Negative.   Respiratory: Negative.   Cardiovascular: Negative.   Gastrointestinal: Negative.   Endocrine: Negative.   Musculoskeletal: Positive for arthralgias.  Allergic/Immunologic: Negative.   Neurological: Negative.   Hematological: Negative.   Psychiatric/Behavioral: Negative.     Social History   Tobacco Use  . Smoking status: Former Smoker    Types: Cigars    Quit date: 10/15/1999    Years since quitting: 19.7  . Smokeless tobacco: Never Used  . Tobacco comment: quit about 15 years ago  Substance  Use Topics  . Alcohol use: Yes    Comment: occasionally, 1-2 drinks monthly      Objective:   BP 126/66 (BP Location: Left Arm, Patient Position: Sitting, Cuff Size: Normal)   Pulse 67   Temp (!) 96.9 F (36.1 C) (Temporal)   Resp 18   Wt 157 lb (71.2 kg)   SpO2 97%   BMI 22.53 kg/m  Vitals:   07/27/19 0850  BP: 126/66  Pulse: 67  Resp: 18  Temp: (!) 96.9 F (36.1 C)  TempSrc: Temporal  SpO2: 97%  Weight: 157 lb (71.2 kg)  Body mass index is 22.53 kg/m.   Physical Exam Vitals signs reviewed.  Constitutional:      Appearance: Normal appearance.  HENT:     Head: Normocephalic and atraumatic.     Right Ear: External ear normal.      Left Ear: External ear normal.  Eyes:     General: No scleral icterus.    Conjunctiva/sclera: Conjunctivae normal.  Cardiovascular:     Rate and Rhythm: Normal rate and regular rhythm.     Heart sounds: Normal heart sounds.  Pulmonary:     Effort: Pulmonary effort is normal.     Breath sounds: Normal breath sounds.  Abdominal:     Palpations: Abdomen is soft. There is no mass.  Skin:    General: Skin is warm and dry.  Neurological:     General: No focal deficit present.     Mental Status: He is alert and oriented to person, place, and time.  Psychiatric:        Mood and Affect: Mood normal.        Behavior: Behavior normal.        Thought Content: Thought content normal.        Judgment: Judgment normal.      No results found for any visits on 07/27/19.     Assessment & Plan    1. Need for immunization against influenza  - Flu vaccine HIGH DOSE PF  2. Weight loss Resolving.  3. Gastroesophageal reflux disease without esophagitis Improved on BID PPI.  4. Fatigue, unspecified type Improved.     Allannah Kempen Cranford Mon, MD  [facility]

## 2019-07-27 NOTE — Patient Instructions (Addendum)
1. Change Pantoprazole once a day starting on Thanksgiving 09/10/2019.

## 2019-09-09 ENCOUNTER — Other Ambulatory Visit: Payer: Self-pay

## 2019-09-16 NOTE — Progress Notes (Deleted)
{Method of visit:23308}    Patient: Mark Accetta., Male    DOB: 11-03-1932, 85 y.o.   MRN: [id number] Visit Date: 09/16/2019  Today's Provider: Wilhemena Durie, MD   No chief complaint on file.  Subjective:     Complete Physical Mark Gato. is a 85 y.o. male. He feels {DESC; WELL/FAIRLY WELL/POORLY:18703}. He reports exercising ***. He reports he is sleeping {DESC; WELL/FAIRLY WELL/POORLY:18703}.  ----------------------------------------------------------- Weight loss Resolving.  Gastroesophageal reflux disease without esophagitis Improved on BID PPI.  Fatigue, unspecified type Improved.  Review of Systems  Social History   Socioeconomic History  . Marital status: Married    Spouse name: Not on file  . Number of children: 1  . Years of education: Not on file  . Highest education level: High school graduate  Occupational History  . Occupation: part time  Social Needs  . Financial resource strain: Not hard at all  . Food insecurity    Worry: Never true    Inability: Never true  . Transportation needs    Medical: No    Non-medical: No  Tobacco Use  . Smoking status: Former Smoker    Types: Cigars    Quit date: 10/15/1999    Years since quitting: 19.9  . Smokeless tobacco: Never Used  . Tobacco comment: quit about 15 years ago  Substance and Sexual Activity  . Alcohol use: Yes    Comment: occasionally, 1-2 drinks monthly  . Drug use: No  . Sexual activity: Not Currently  Lifestyle  . Physical activity    Days per week: 0 days    Minutes per session: 0 min  . Stress: Only a little  Relationships  . Social Herbalist on phone: Patient refused    Gets together: Patient refused    Attends religious service: Patient refused    Active member of club or organization: Patient refused    Attends meetings of clubs or organizations: Patient refused    Relationship status: Patient refused  . Intimate partner violence    Fear  of current or ex partner: Patient refused    Emotionally abused: Patient refused    Physically abused: Patient refused    Forced sexual activity: Patient refused  Other Topics Concern  . Not on file  Social History Narrative  . Not on file    Past Medical History:  Diagnosis Date  . Arthritis    left ankle  . Brain tumor (benign) ([place])   . COPD (chronic obstructive pulmonary disease) ([place])   . GERD (gastroesophageal reflux disease)   . Heart murmur   . Hypertension   . Seasonal allergies      Patient Active Problem List   Diagnosis Date Noted  . LVH (left ventricular hypertrophy) due to hypertensive disease, without heart failure 11/03/2018  . Chronic kidney disease 08/16/2015  . Supraventricular tachycardia ([place]) 08/16/2015  . Allergic rhinitis 08/04/2015  . Airway hyperreactivity 08/04/2015  . Benign fibroma of prostate 08/04/2015  . CAFL (chronic airflow limitation) ([place]) 08/04/2015  . Essential (primary) hypertension 08/04/2015  . Acid reflux 08/04/2015  . Glaucoma 08/04/2015  . HLD (hyperlipidemia) 08/04/2015  . Mild cognitive disorder 08/04/2015  . Arthritis, degenerative 08/04/2015  . Asymptomatic varicose veins 08/04/2015  . TI (tricuspid incompetence) 11/21/2014  . Paroxysmal atrial fibrillation ([place]) 11/19/2014  . Esophagitis, reflux 11/08/2014  . MI (mitral incompetence) 11/08/2014  . Nonspecific (abnormal) findings on radiological and other examination of gastrointestinal tract 03/27/2012  Past Surgical History:  Procedure Laterality Date  . EUS  03/27/2012   Procedure: UPPER ENDOSCOPIC ULTRASOUND (EUS) LINEAR;  Surgeon: Milus Banister, MD;  Location: [facility];  Service: Endoscopy;  Laterality: N/A;  Raidal EUS  . FINE NEEDLE ASPIRATION  03/27/2012   Procedure: FINE NEEDLE ASPIRATION (FNA) LINEAR;  Surgeon: Milus Banister, MD;  Location: [facility];  Service: Endoscopy;  Laterality: N/A;  . HERNIA REPAIR    . ORIF ANKLE FRACTURE      His  family history includes Arthritis in his mother; Breast cancer in his sister and sister; Heart disease in his mother; Hypertension in his mother.   Current Outpatient Medications:  .  aspirin 81 MG tablet, Take 81 mg by mouth daily., Disp: , Rfl:  .  Brinzolamide-Brimonidine (SIMBRINZA) 1-0.2 % SUSP, Apply 3 drops daily to eye. , Disp: , Rfl:  .  carboxymethylcellulose (REFRESH PLUS) 0.5 % SOLN, Place 1 drop into both eyes 4 (four) times daily., Disp: , Rfl:  .  cetirizine (ZYRTEC) 10 MG tablet, Take 10 mg by mouth daily., Disp: , Rfl:  .  fluticasone (FLONASE) 50 MCG/ACT nasal spray, TAKE TWO PUFFS EACH NOSTRIL DAILY., Disp: 48 g, Rfl: 3 .  HYDROcodone-acetaminophen (NORCO) 10-325 MG tablet, Take 1 tablet by mouth 4 (four) times daily as needed. Reviewed controlled substance record from PMP Alert., Disp: 100 tablet, Rfl: 0 .  HYDROcodone-acetaminophen (NORCO) 7.5-325 MG tablet, Take 1 tablet by mouth every 6 (six) hours as needed for moderate pain., Disp: 30 tablet, Rfl: 0 .  latanoprost (XALATAN) 0.005 % ophthalmic solution, Place 1 drop at bedtime into both eyes., Disp: , Rfl:  .  metoprolol tartrate (LOPRESSOR) 25 MG tablet, Take 0.5 tablets (12.5 mg total) by mouth 3 (three) times daily with meals., Disp: , Rfl:  .  mometasone (ASMANEX) 220 MCG/INH inhaler, Inhale 1 puff into the lungs daily. , Disp: , Rfl:  .  Multiple Vitamin (MULTI-VITAMINS) TABS, Take daily by mouth. , Disp: , Rfl:  .  Multiple Vitamins-Minerals (PRESERVISION AREDS 2 PO), Take by mouth., Disp: , Rfl:  .  neomycin-polymyxin b-dexamethasone (MAXITROL) 3.5-10000-0.1 SUSP, , Disp: , Rfl:  .  Netarsudil Dimesylate 0.02 % SOLN, Apply 1 drop to eye at bedtime., Disp: , Rfl:  .  pantoprazole (PROTONIX) 40 MG tablet, Take 1 tablet (40 mg total) by mouth 2 (two) times daily., Disp: 180 tablet, Rfl: 1 .  polyethylene glycol (MIRALAX / GLYCOLAX) 17 g packet, Take 17 g by mouth daily., Disp: , Rfl:  .  pravastatin (PRAVACHOL) 20 MG  tablet, Take 20 mg by mouth daily. , Disp: , Rfl:  .  terazosin (HYTRIN) 5 MG capsule, Take 5 mg by mouth at bedtime., Disp: , Rfl:  .  timolol (BETIMOL) 0.25 % ophthalmic solution, 1-2 drops 2 (two) times daily., Disp: , Rfl:  .  Wheat Dextrin (BENEFIBER PO), Take by mouth., Disp: , Rfl:   Patient Care Team: Jerrol Banana., MD as PCP - General (Family Medicine) Beacher May, MD as Referring Physician (Internal Medicine) Charletta Cousin, MD as Referring Physician (Ophthalmology) Corey Skains, MD as Consulting Physician (Cardiology) Chrismon, Vickki Muff, [place] (Family Medicine)     Objective:    Vitals: There were no vitals taken for this visit.  Physical Exam  Activities of Daily Living No flowsheet data found.  Fall Risk Assessment Fall Risk  09/05/2018 09/05/2018 08/23/2017 08/27/2016 08/16/2015  Falls in the past year? 1 1 No No No  Number falls in past yr: 0 0 - - -  Injury with Fall? 0 0 - - -  Follow up - Falls prevention discussed - - -     Depression Screen PHQ 2/9 Scores 09/05/2018 09/05/2018 09/05/2018 08/23/2017  PHQ - 2 Score 0 0 0 0  PHQ- 9 Score - 0 - -    6CIT Screen 08/27/2016  What Year? 0 points  What month? 0 points  What time? 0 points  Count back from 20 0 points  Months in reverse 0 points  Repeat phrase 6 points  Total Score 6       Assessment & Plan:    Annual Physical Reviewed patient's Family Medical History Reviewed and updated list of patient's medical providers Assessment of cognitive impairment was done Assessed patient's functional ability Established a written schedule for health screening [place] Completed and Reviewed  Exercise Activities and Dietary recommendations Goals    . Exercise 3x per week (30 min per time)     Recommend to exercise for 3 days a week for at least 30 minutes at a time.      . Increase water intake     Recommend drinking 6-8 glasses of water daily.         Immunization History  Administered Date(s) Administered  . Influenza, High Dose Seasonal PF 09/09/2016, 07/27/2019  . Influenza-Unspecified 08/11/2017, 07/21/2018  . Pneumococcal Conjugate-13 08/16/2014  . Pneumococcal Polysaccharide-23 02/15/2015  . Td 01/24/2004, 04/16/2011, 05/25/2016  . Tdap 04/16/2011    Health Maintenance  Topic Date Due  . Samul Dada  05/25/2026  . INFLUENZA VACCINE  Completed  . PNA vac Low Risk Adult  Completed     Discussed health benefits of physical activity, and encouraged him to engage in regular exercise appropriate for his age and condition.    ------------------------------------------------------------------------------------------------------------    Wilhemena Durie, MD  [place]

## 2019-09-17 NOTE — Progress Notes (Signed)
Subjective:   ZAYQUAN EHRLER Sr. is a 85 y.o. male who presents for Medicare Annual/Subsequent preventive examination.   This visit is being conducted through telemedicine due to the COVID-19 pandemic. This patient has given me verbal consent via doximity to conduct this visit, patient states they are participating from their home address. Some vital signs may be absent or patient reported.    Patient identification: identified by name, DOB, and current address   Review of Systems:  N/A  Cardiac Risk Factors include: advanced age (>33men, >63 women);dyslipidemia;hypertension;male gender     Objective:    Vitals: There were no vitals taken for this visit.  There is no height or weight on file to calculate BMI. Unable to obtain vitals due to visit being conducted via telephonically.   Advanced Directives 09/21/2019 09/05/2018 08/23/2017 07/12/2016 02/13/2016 03/27/2012  Does Patient Have a Medical Advance Directive? Yes Yes Yes No Yes Patient has advance directive, copy not in chart  Type of Advance Directive [place];Living will [place];Living will Living will - - [place];Living will  Copy of [place] in Chart? No - copy requested No - copy requested - - - -    Tobacco Social History   Tobacco Use  Smoking Status Former Smoker  . Types: Cigars  . Quit date: 10/15/1999  . Years since quitting: 19.9  Smokeless Tobacco Never Used  Tobacco Comment   quit about 15 years ago     Counseling given: Not Answered Comment: quit about 15 years ago   Clinical Intake:  Pre-visit preparation completed: Yes  Pain : No/denies pain Pain Score: 0-No pain     Nutritional Risks: None Diabetes: No  How often do you need to have someone help you when you read instructions, pamphlets, or other written materials from your doctor or pharmacy?: 1 - Never  Interpreter Needed?: No  Information entered by ::  [facility], LPN  Past Medical History:  Diagnosis Date  . Arthritis    left ankle  . Brain tumor (benign) ([place])   . COPD (chronic obstructive pulmonary disease) ([place])   . GERD (gastroesophageal reflux disease)   . Heart murmur   . Hypertension   . Seasonal allergies    Past Surgical History:  Procedure Laterality Date  . EUS  03/27/2012   Procedure: UPPER ENDOSCOPIC ULTRASOUND (EUS) LINEAR;  Surgeon: Milus Banister, MD;  [facility];  Service: Endoscopy;  Laterality: N/A;  [place] EUS  . FINE NEEDLE ASPIRATION  03/27/2012   Procedure: FINE NEEDLE ASPIRATION (FNA) LINEAR;  Surgeon: Milus Banister, MD;  [facility];  Service: Endoscopy;  Laterality: N/A;  . HERNIA REPAIR    . ORIF ANKLE FRACTURE     Family History  Problem Relation Age of Onset  . Heart disease Mother   . Hypertension Mother   . Arthritis Mother   . Breast cancer Sister   . Breast cancer Sister    Social History   Socioeconomic History  . Marital status: Married    Spouse name: Not on file  . Number of children: 1  . Years of education: Not on file  . Highest education level: High school graduate  Occupational History  . Occupation: part time  Social Needs  . Financial resource strain: Not hard at all  . Food insecurity    Worry: Never true    Inability: Never true  . Transportation needs    Medical: No  Non-medical: No  Tobacco Use  . Smoking status: Former Smoker    Types: Cigars    Quit date: 10/15/1999    Years since quitting: 19.9  . Smokeless tobacco: Never Used  . Tobacco comment: quit about 15 years ago  Substance and Sexual Activity  . Alcohol use: Yes    Comment: occasionally, 1-2 drinks monthly  . Drug use: No  . Sexual activity: Not Currently  Lifestyle  . Physical activity    Days per week: 0 days    Minutes per session: 0 min  . Stress: Not at all  Relationships  . Social Herbalist on phone: Patient refused    Gets together: Patient  refused    Attends religious service: Patient refused    Active member of club or organization: Patient refused    Attends meetings of clubs or organizations: Patient refused    Relationship status: Patient refused  Other Topics Concern  . Not on file  Social History Narrative  . Not on file    Outpatient Encounter Medications as of 09/21/2019  Medication Sig  . amoxicillin (AMOXIL) 500 MG tablet Take 500 mg by mouth as directed. 4 tablets prior to dental procedures.  Marland Kitchen aspirin 81 MG tablet Take 81 mg by mouth daily.  . Brinzolamide-Brimonidine (SIMBRINZA) 1-0.2 % SUSP Place 3 drops into both eyes daily. As needed  . carboxymethylcellulose (REFRESH PLUS) 0.5 % SOLN Place 1 drop into both eyes 2 (two) times daily as needed.   . cetirizine (ZYRTEC) 10 MG tablet Take 10 mg by mouth daily.  . fluticasone (FLONASE) 50 MCG/ACT nasal spray TAKE TWO PUFFS EACH NOSTRIL DAILY.  Marland Kitchen HYDROcodone-acetaminophen (NORCO) 7.5-325 MG tablet Take 1 tablet by mouth every 6 (six) hours as needed for moderate pain. (Patient taking differently: Take 0.25 tablets by mouth every 6 (six) hours as needed for moderate pain. )  . latanoprost (XALATAN) 0.005 % ophthalmic solution Place 1 drop at bedtime into both eyes.  . metoprolol tartrate (LOPRESSOR) 25 MG tablet Take 0.5 tablets (12.5 mg total) by mouth 3 (three) times daily with meals. (Patient taking differently: Take 12.5 mg by mouth 2 (two) times daily. )  . mometasone (ASMANEX) 220 MCG/INH inhaler Inhale 1 puff into the lungs daily.   . Multiple Vitamins-Minerals (PRESERVISION AREDS 2 PO) Take by mouth 2 (two) times daily.   Marland Kitchen neomycin-polymyxin b-dexamethasone (MAXITROL) 3.5-10000-0.1 SUSP Place 1 drop into both eyes as needed.   . Netarsudil Dimesylate 0.02 % SOLN Apply 1 drop to eye at bedtime.  . pantoprazole (PROTONIX) 40 MG tablet Take 1 tablet (40 mg total) by mouth 2 (two) times daily. (Patient taking differently: Take 40 mg by mouth daily. )  .  polyethylene glycol (MIRALAX / GLYCOLAX) 17 g packet Take 17 g by mouth daily as needed.   . pravastatin (PRAVACHOL) 20 MG tablet Take 20 mg by mouth daily.   Marland Kitchen terazosin (HYTRIN) 5 MG capsule Take 5 mg by mouth at bedtime.  . vitamin B-12 (CYANOCOBALAMIN) 500 MCG tablet Take 500 mcg by mouth daily.  . Wheat Dextrin (BENEFIBER PO) Take by mouth daily. When remembers  . HYDROcodone-acetaminophen (NORCO) 10-325 MG tablet Take 1 tablet by mouth 4 (four) times daily as needed. Reviewed controlled substance record from PMP Alert. (Patient not taking: Reported on 09/21/2019)  . Multiple Vitamin (MULTI-VITAMINS) TABS Take daily by mouth.   . timolol (BETIMOL) 0.25 % ophthalmic solution 1-2 drops 2 (two) times daily.   No facility-administered  encounter medications on file as of 09/21/2019.     Activities of Daily Living In your present state of health, do you have any difficulty performing the following activities: 09/21/2019  Hearing? Y  Comment Wears hearing aids in both ears.  Vision? N  Difficulty concentrating or making decisions? N  Walking or climbing stairs? N  Dressing or bathing? N  Doing errands, shopping? N  Preparing Food and eating ? N  Using the Toilet? N  In the past six months, have you accidently leaked urine? N  Do you have problems with loss of bowel control? N  Managing your Medications? N  Managing your Finances? N  Housekeeping or managing your Housekeeping? N  Some recent data might be hidden    Patient Care Team: Jerrol Banana., MD as PCP - General (Family Medicine) Beacher May, MD as Referring Physician (Internal Medicine) Charletta Cousin, MD as Referring Physician (Ophthalmology) Corey Skains, MD as Consulting Physician (Cardiology) Chrismon, Vickki Muff, [place] (Family Medicine)   Assessment:   This is a routine wellness examination for [place].  Exercise Activities and Dietary recommendations Current Exercise Habits: Home exercise  routine, Type of exercise: treadmill;walking;strength training/weights, Time (Minutes): 10, Frequency (Times/Week): 7, Weekly Exercise (Minutes/Week): 70, Intensity: Mild, Exercise limited by: None identified  Goals    . Exercise 3x per week (30 min per time)     Recommend to exercise for 3 days a week for at least 30 minutes at a time.      . Increase water intake     Recommend drinking 6-8 glasses of water daily.        Fall Risk: Fall Risk  09/21/2019 09/05/2018 09/05/2018 08/23/2017 08/27/2016  Falls in the past year? 0 1 1 No No  Number falls in past yr: 0 0 0 - -  Injury with Fall? 0 0 0 - -  Follow up - - Falls prevention discussed - -    FALL RISK PREVENTION PERTAINING TO THE HOME:  Any stairs in or around the home? Yes  If so, are there any without handrails? No   Home free of loose throw rugs in walkways, pet beds, electrical cords, etc? Yes  Adequate lighting in your home to reduce risk of falls? Yes   ASSISTIVE DEVICES UTILIZED TO PREVENT FALLS:  Life alert? No  Use of a cane, walker or w/c? No  Grab bars in the bathroom? No  Shower chair or bench in shower? No  Elevated toilet seat or a handicapped toilet? No   TIMED UP AND GO:  Was the test performed? No .    Depression Screen PHQ 2/9 Scores 09/21/2019 09/05/2018 09/05/2018 09/05/2018  PHQ - 2 Score 0 0 0 0  PHQ- 9 Score - - 0 -    Cognitive Function     6CIT Screen 09/21/2019 08/27/2016  What Year? 0 points 0 points  What month? 0 points 0 points  What time? 0 points 0 points  Count back from 20 0 points 0 points  Months in reverse 0 points 0 points  Repeat phrase 0 points 6 points  Total Score 0 6    Immunization History  Administered Date(s) Administered  . Influenza, High Dose Seasonal PF 09/09/2016, 07/27/2019  . Influenza-Unspecified 08/11/2017, 07/21/2018  . Pneumococcal Conjugate-13 08/16/2014  . Pneumococcal Polysaccharide-23 02/15/2015  . Td 01/24/2004, 04/16/2011, 05/25/2016  .  Tdap 04/16/2011    Qualifies for Shingles Vaccine? Yes . Due for Shingrix. Education has been  provided regarding the importance of this vaccine. Pt has been advised to call insurance company to determine out of pocket expense. Advised may also receive vaccine at local pharmacy or Health Dept. Verbalized acceptance and understanding.  Tdap: Up to date  Flu Vaccine: Up to date  Pneumococcal Vaccine: Completed series  Screening Tests Health Maintenance  Topic Date Due  . TETANUS/TDAP  05/25/2026  . INFLUENZA VACCINE  Completed  . PNA vac Low Risk Adult  Completed   Cancer Screenings:  Colorectal Screening: No longer required.   Lung Cancer Screening: (Low Dose CT Chest recommended if Age 73-80 years, 30 pack-year currently smoking OR have quit w/in 15years.) does not qualify.   Additional Screening:  Vision Screening: Recommended annual ophthalmology exams for early detection of glaucoma and other disorders of the eye.  Dental Screening: Recommended annual dental exams for proper oral hygiene  Community Resource Referral:  CRR required this visit?  No        Plan:  I have personally reviewed and addressed the Medicare Annual Wellness questionnaire and have noted the following in the patient's chart:  A. Medical and social history B. Use of alcohol, tobacco or illicit drugs  C. Current medications and supplements D. Functional ability and status E.  Nutritional status F.  Physical activity G. Advance directives H. List of other physicians I.  Hospitalizations, surgeries, and ER visits in previous 12 months J.  [place] such as hearing and vision if needed, cognitive and depression L. Referrals and appointments   In addition, I have reviewed and discussed with patient certain preventive protocols, quality metrics, and best practice recommendations. A written personalized care plan for preventive services as well as general preventive health recommendations  were provided to patient.   [place] Score, LPN  [id number] Nurse Health Advisor   Nurse Notes: None.

## 2019-09-18 NOTE — Progress Notes (Deleted)
{Method of visit:23308}    Patient: Mark Nordmann., Male    DOB: 1933-01-19, 85 y.o.   MRN: [id number] Visit Date: 09/18/2019  Today's Provider: Wilhemena Durie, MD   No chief complaint on file.  Subjective:     Patient had AWV with [facility] at 9:20 am this morning.   Complete Physical Dollie Steeg. is a 85 y.o. male. He feels {DESC; WELL/FAIRLY WELL/POORLY:18703}. He reports exercising ***. He reports he is sleeping {DESC; WELL/FAIRLY WELL/POORLY:18703}.  -----------------------------------------------------------  Colonoscopy: 01/05/2002  Review of Systems  Social History   Socioeconomic History  . Marital status: Married    Spouse name: Not on file  . Number of children: 1  . Years of education: Not on file  . Highest education level: High school graduate  Occupational History  . Occupation: part time  Social Needs  . Financial resource strain: Not hard at all  . Food insecurity    Worry: Never true    Inability: Never true  . Transportation needs    Medical: No    Non-medical: No  Tobacco Use  . Smoking status: Former Smoker    Types: Cigars    Quit date: 10/15/1999    Years since quitting: 19.9  . Smokeless tobacco: Never Used  . Tobacco comment: quit about 15 years ago  Substance and Sexual Activity  . Alcohol use: Yes    Comment: occasionally, 1-2 drinks monthly  . Drug use: No  . Sexual activity: Not Currently  Lifestyle  . Physical activity    Days per week: 0 days    Minutes per session: 0 min  . Stress: Only a little  Relationships  . Social Herbalist on phone: Patient refused    Gets together: Patient refused    Attends religious service: Patient refused    Active member of club or organization: Patient refused    Attends meetings of clubs or organizations: Patient refused    Relationship status: Patient refused  . Intimate partner violence    Fear of current or ex partner: Patient refused    Emotionally abused:  Patient refused    Physically abused: Patient refused    Forced sexual activity: Patient refused  Other Topics Concern  . Not on file  Social History Narrative  . Not on file    Past Medical History:  Diagnosis Date  . Arthritis    left ankle  . Brain tumor (benign) ([place])   . COPD (chronic obstructive pulmonary disease) ([place])   . GERD (gastroesophageal reflux disease)   . Heart murmur   . Hypertension   . Seasonal allergies      Patient Active Problem List   Diagnosis Date Noted  . LVH (left ventricular hypertrophy) due to hypertensive disease, without heart failure 11/03/2018  . Chronic kidney disease 08/16/2015  . Supraventricular tachycardia ([place]) 08/16/2015  . Allergic rhinitis 08/04/2015  . Airway hyperreactivity 08/04/2015  . Benign fibroma of prostate 08/04/2015  . CAFL (chronic airflow limitation) ([place]) 08/04/2015  . Essential (primary) hypertension 08/04/2015  . Acid reflux 08/04/2015  . Glaucoma 08/04/2015  . HLD (hyperlipidemia) 08/04/2015  . Mild cognitive disorder 08/04/2015  . Arthritis, degenerative 08/04/2015  . Asymptomatic varicose veins 08/04/2015  . TI (tricuspid incompetence) 11/21/2014  . Paroxysmal atrial fibrillation ([place]) 11/19/2014  . Esophagitis, reflux 11/08/2014  . MI (mitral incompetence) 11/08/2014  . Nonspecific (abnormal) findings on radiological and other examination of gastrointestinal tract 03/27/2012    Past  Surgical History:  Procedure Laterality Date  . EUS  03/27/2012   Procedure: UPPER ENDOSCOPIC ULTRASOUND (EUS) LINEAR;  Surgeon: Milus Banister, MD;  Location: [facility];  Service: Endoscopy;  Laterality: N/A;  [place] EUS  . FINE NEEDLE ASPIRATION  03/27/2012   Procedure: FINE NEEDLE ASPIRATION (FNA) LINEAR;  Surgeon: Milus Banister, MD;  Location: [facility];  Service: Endoscopy;  Laterality: N/A;  . HERNIA REPAIR    . ORIF ANKLE FRACTURE      His family history includes Arthritis in his mother; Breast cancer in his  sister and sister; Heart disease in his mother; Hypertension in his mother.   Current Outpatient Medications:  .  aspirin 81 MG tablet, Take 81 mg by mouth daily., Disp: , Rfl:  .  Brinzolamide-Brimonidine (SIMBRINZA) 1-0.2 % SUSP, Apply 3 drops daily to eye. , Disp: , Rfl:  .  carboxymethylcellulose (REFRESH PLUS) 0.5 % SOLN, Place 1 drop into both eyes 4 (four) times daily., Disp: , Rfl:  .  cetirizine (ZYRTEC) 10 MG tablet, Take 10 mg by mouth daily., Disp: , Rfl:  .  fluticasone (FLONASE) 50 MCG/ACT nasal spray, TAKE TWO PUFFS EACH NOSTRIL DAILY., Disp: 48 g, Rfl: 3 .  HYDROcodone-acetaminophen (NORCO) 10-325 MG tablet, Take 1 tablet by mouth 4 (four) times daily as needed. Reviewed controlled substance record from PMP Alert., Disp: 100 tablet, Rfl: 0 .  HYDROcodone-acetaminophen (NORCO) 7.5-325 MG tablet, Take 1 tablet by mouth every 6 (six) hours as needed for moderate pain., Disp: 30 tablet, Rfl: 0 .  latanoprost (XALATAN) 0.005 % ophthalmic solution, Place 1 drop at bedtime into both eyes., Disp: , Rfl:  .  metoprolol tartrate (LOPRESSOR) 25 MG tablet, Take 0.5 tablets (12.5 mg total) by mouth 3 (three) times daily with meals., Disp: , Rfl:  .  mometasone (ASMANEX) 220 MCG/INH inhaler, Inhale 1 puff into the lungs daily. , Disp: , Rfl:  .  Multiple Vitamin (MULTI-VITAMINS) TABS, Take daily by mouth. , Disp: , Rfl:  .  Multiple Vitamins-Minerals (PRESERVISION AREDS 2 PO), Take by mouth., Disp: , Rfl:  .  neomycin-polymyxin b-dexamethasone (MAXITROL) 3.5-10000-0.1 SUSP, , Disp: , Rfl:  .  Netarsudil Dimesylate 0.02 % SOLN, Apply 1 drop to eye at bedtime., Disp: , Rfl:  .  pantoprazole (PROTONIX) 40 MG tablet, Take 1 tablet (40 mg total) by mouth 2 (two) times daily., Disp: 180 tablet, Rfl: 1 .  polyethylene glycol (MIRALAX / GLYCOLAX) 17 g packet, Take 17 g by mouth daily., Disp: , Rfl:  .  pravastatin (PRAVACHOL) 20 MG tablet, Take 20 mg by mouth daily. , Disp: , Rfl:  .  terazosin  (HYTRIN) 5 MG capsule, Take 5 mg by mouth at bedtime., Disp: , Rfl:  .  timolol (BETIMOL) 0.25 % ophthalmic solution, 1-2 drops 2 (two) times daily., Disp: , Rfl:  .  Wheat Dextrin (BENEFIBER PO), Take by mouth., Disp: , Rfl:   Patient Care Team: Mark Banana., MD as PCP - General (Family Medicine) Mark May, MD as Referring Physician (Internal Medicine) Mark Cousin, MD as Referring Physician (Ophthalmology) Mark Skains, MD as Consulting Physician (Cardiology) Mark Sanchez, Mark Sanchez, Mark Sanchez (Family Medicine)     Objective:    Vitals: There were no vitals taken for this visit.  Physical Exam  Activities of Daily Living No flowsheet data found.  Fall Risk Assessment Fall Risk  09/05/2018 09/05/2018 08/23/2017 08/27/2016 08/16/2015  Falls in the past year? 1 1 No No No  Number falls in past yr: 0 0 - - -  Injury with Fall? 0 0 - - -  Follow up - Falls prevention discussed - - -     Depression Screen PHQ 2/9 Scores 09/05/2018 09/05/2018 09/05/2018 08/23/2017  PHQ - 2 Score 0 0 0 0  PHQ- 9 Score - 0 - -    6CIT Screen 08/27/2016  What Year? 0 points  What month? 0 points  What time? 0 points  Count back from 20 0 points  Months in reverse 0 points  Repeat phrase 6 points  Total Score 6       Assessment & Plan:    Annual Physical Reviewed patient's Family Medical History Reviewed and updated list of patient's medical providers Assessment of cognitive impairment was done Assessed patient's functional ability Established a written schedule for health screening [place] Completed and Reviewed  Exercise Activities and Dietary recommendations Goals    . Exercise 3x per week (30 min per time)     Recommend to exercise for 3 days a week for at least 30 minutes at a time.      . Increase water intake     Recommend drinking 6-8 glasses of water daily.        Immunization History  Administered Date(s) Administered   . Influenza, High Dose Seasonal PF 09/09/2016, 07/27/2019  . Influenza-Unspecified 08/11/2017, 07/21/2018  . Pneumococcal Conjugate-13 08/16/2014  . Pneumococcal Polysaccharide-23 02/15/2015  . Td 01/24/2004, 04/16/2011, 05/25/2016  . Tdap 04/16/2011    Health Maintenance  Topic Date Due  . Samul Dada  05/25/2026  . INFLUENZA VACCINE  Completed  . PNA vac Low Risk Adult  Completed     Discussed health benefits of physical activity, and encouraged him to engage in regular exercise appropriate for his age and condition.    ------------------------------------------------------------------------------------------------------------    Wilhemena Durie, MD  [place]

## 2019-09-21 ENCOUNTER — Encounter: Payer: Self-pay | Admitting: Family Medicine

## 2019-09-21 ENCOUNTER — Other Ambulatory Visit: Payer: Self-pay

## 2019-09-21 ENCOUNTER — Ambulatory Visit (INDEPENDENT_AMBULATORY_CARE_PROVIDER_SITE_OTHER): Payer: PPO

## 2019-09-21 DIAGNOSIS — Z0000 Encounter for general adult medical examination without abnormal findings: Secondary | ICD-10-CM | POA: Diagnosis not present

## 2019-09-21 NOTE — Patient Instructions (Addendum)
[name] , Thank you for taking time to come for your Medicare Wellness Visit. I appreciate your ongoing commitment to your health goals. Please review the following plan we discussed and let me know if I can assist you in the future.   Screening recommendations/referrals: Colonoscopy: No longer required.  Recommended yearly ophthalmology/optometry visit for glaucoma screening and checkup Recommended yearly dental visit for hygiene and checkup  Vaccinations: Influenza vaccine: Up to date Pneumococcal vaccine: Completed series Tdap vaccine: Up to date, due 05/2026 Shingles vaccine: Pt declines today.     Advanced directives: Please bring a copy of your POA (Power of Attorney) and/or Living Will to your next appointment.   Conditions/risks identified: Continue to increase water intake to 6-8 8 oz glasses a day as tolerated.   Next appointment: 10/26/19 @ 10:00 AM with Dr Rosanna Randy. Declined scheduling an AWV for 2021 at this time.   Preventive Care 56 Years and Older, Male Preventive care refers to lifestyle choices and visits with your health care provider that can promote health and wellness. What does preventive care include?  A yearly physical exam. This is also called an annual well check.  Dental exams once or twice a year.  Routine eye exams. Ask your health care provider how often you should have your eyes checked.  Personal lifestyle choices, including:  Daily care of your teeth and gums.  Regular physical activity.  Eating a healthy diet.  Avoiding tobacco and drug use.  Limiting alcohol use.  Practicing safe sex.  Taking low doses of aspirin every day.  Taking vitamin and mineral supplements as recommended by your health care provider. What happens during an annual well check? The services and screenings done by your health care provider during your annual well check will depend on your age, overall health, lifestyle risk factors, and family history of disease.  Counseling  Your health care provider may ask you questions about your:  Alcohol use.  Tobacco use.  Drug use.  Emotional well-being.  Home and relationship well-being.  Sexual activity.  Eating habits.  History of falls.  Memory and ability to understand (cognition).  Work and work Statistician. Screening  You may have the following tests or measurements:  Height, weight, and BMI.  Blood pressure.  Lipid and cholesterol levels. These may be checked every 5 years, or more frequently if you are over 79 years old.  Skin check.  Lung cancer screening. You may have this screening every year starting at age 94 if you have a 30-pack-year history of smoking and currently smoke or have quit within the past 15 years.  Fecal occult blood test (FOBT) of the stool. You may have this test every year starting at age 28.  Flexible sigmoidoscopy or colonoscopy. You may have a sigmoidoscopy every 5 years or a colonoscopy every 10 years starting at age 32.  Prostate cancer screening. Recommendations will vary depending on your family history and other risks.  Hepatitis C blood test.  Hepatitis B blood test.  Sexually transmitted disease (STD) testing.  Diabetes screening. This is done by checking your blood sugar (glucose) after you have not eaten for a while (fasting). You may have this done every 1-3 years.  Abdominal aortic aneurysm (AAA) screening. You may need this if you are a current or former smoker.  Osteoporosis. You may be screened starting at age 62 if you are at high risk. Talk with your health care provider about your test results, treatment options, and if necessary, the  need for more tests. Vaccines  Your health care provider may recommend certain vaccines, such as:  Influenza vaccine. This is recommended every year.  Tetanus, diphtheria, and acellular pertussis (Tdap, Td) vaccine. You may need a Td booster every 10 years.  Zoster vaccine. You may need this  after age 34.  Pneumococcal 13-valent conjugate (PCV13) vaccine. One dose is recommended after age 65.  Pneumococcal polysaccharide (PPSV23) vaccine. One dose is recommended after age 61. Talk to your health care provider about which screenings and vaccines you need and how often you need them. This information is not intended to replace advice given to you by your health care provider. Make sure you discuss any questions you have with your health care provider. Document Released: 10/28/2015 Document Revised: 06/20/2016 Document Reviewed: 08/02/2015 Elsevier Interactive Patient Education  2017 [place] Prevention in the Home Falls can cause injuries. They can happen to people of all ages. There are many things you can do to make your home safe and to help prevent falls. What can I do on the outside of my home?  Regularly fix the edges of walkways and driveways and fix any cracks.  Remove anything that might make you trip as you walk through a door, such as a raised step or threshold.  Trim any bushes or trees on the path to your home.  Use bright outdoor lighting.  Clear any walking paths of anything that might make someone trip, such as rocks or tools.  Regularly check to see if handrails are loose or broken. Make sure that both sides of any steps have handrails.  Any raised decks and porches should have guardrails on the edges.  Have any leaves, snow, or ice cleared regularly.  Use sand or salt on walking paths during winter.  Clean up any spills in your garage right away. This includes oil or grease spills. What can I do in the bathroom?  Use night lights.  Install grab bars by the toilet and in the tub and shower. Do not use towel bars as grab bars.  Use non-skid mats or decals in the tub or shower.  If you need to sit down in the shower, use a plastic, non-slip stool.  Keep the floor dry. Clean up any water that spills on the floor as soon as it happens.   Remove soap buildup in the tub or shower regularly.  Attach bath mats securely with double-sided non-slip rug tape.  Do not have throw rugs and other things on the floor that can make you trip. What can I do in the bedroom?  Use night lights.  Make sure that you have a light by your bed that is easy to reach.  Do not use any sheets or blankets that are too big for your bed. They should not hang down onto the floor.  Have a firm chair that has side arms. You can use this for support while you get dressed.  Do not have throw rugs and other things on the floor that can make you trip. What can I do in the kitchen?  Clean up any spills right away.  Avoid walking on wet floors.  Keep items that you use a lot in easy-to-reach places.  If you need to reach something above you, use a strong step stool that has a grab bar.  Keep electrical cords out of the way.  Do not use floor polish or wax that makes floors slippery. If you must use wax,  use non-skid floor wax.  Do not have throw rugs and other things on the floor that can make you trip. What can I do with my stairs?  Do not leave any items on the stairs.  Make sure that there are handrails on both sides of the stairs and use them. Fix handrails that are broken or loose. Make sure that handrails are as long as the stairways.  Check any carpeting to make sure that it is firmly attached to the stairs. Fix any carpet that is loose or worn.  Avoid having throw rugs at the top or bottom of the stairs. If you do have throw rugs, attach them to the floor with carpet tape.  Make sure that you have a light switch at the top of the stairs and the bottom of the stairs. If you do not have them, ask someone to add them for you. What else can I do to help prevent falls?  Wear shoes that:  Do not have high heels.  Have rubber bottoms.  Are comfortable and fit you well.  Are closed at the toe. Do not wear sandals.  If you use a  stepladder:  Make sure that it is fully opened. Do not climb a closed stepladder.  Make sure that both sides of the stepladder are locked into place.  Ask someone to hold it for you, if possible.  Clearly mark and make sure that you can see:  Any grab bars or handrails.  First and last steps.  Where the edge of each step is.  Use tools that help you move around (mobility aids) if they are needed. These include:  Canes.  Walkers.  Scooters.  Crutches.  Turn on the lights when you go into a dark area. Replace any light bulbs as soon as they burn out.  Set up your furniture so you have a clear path. Avoid moving your furniture around.  If any of your floors are uneven, fix them.  If there are any pets around you, be aware of where they are.  Review your medicines with your doctor. Some medicines can make you feel dizzy. This can increase your chance of falling. Ask your doctor what other things that you can do to help prevent falls. This information is not intended to replace advice given to you by your health care provider. Make sure you discuss any questions you have with your health care provider. Document Released: 07/28/2009 Document Revised: 03/08/2016 Document Reviewed: 11/05/2014 Elsevier Interactive Patient Education  2017 [employer].

## 2019-10-23 NOTE — Progress Notes (Deleted)
Patient: Mark Sanchez., Male    DOB: [birth year], 86 y.o.   MRN: [id number] Visit Date: 10/23/2019  Today's Provider: Wilhemena Durie, MD   No chief complaint on file.  Subjective:     Patient had AWV with [facility] 09/21/2019.   Complete Physical Mark Sanchez. is a 86 y.o. male. He feels {DESC; WELL/FAIRLY WELL/POORLY:18703}. He reports exercising ***. He reports he is sleeping {DESC; WELL/FAIRLY WELL/POORLY:18703}.  -----------------------------------------------------------  Review of Systems  Social History   Socioeconomic History  . Marital status: Married    Spouse name: Not on file  . Number of children: 1  . Years of education: Not on file  . Highest education level: High school graduate  Occupational History  . Occupation: part time  Tobacco Use  . Smoking status: Former Smoker    Types: Cigars    Quit date: 10/15/1999    Years since quitting: 20.0  . Smokeless tobacco: Never Used  . Tobacco comment: quit about 15 years ago  Substance and Sexual Activity  . Alcohol use: Yes    Comment: occasionally, 1-2 drinks monthly  . Drug use: No  . Sexual activity: Not Currently  Other Topics Concern  . Not on file  Social History Narrative  . Not on file   Social Determinants of Health   Financial Resource Strain:   . Difficulty of Paying Living Expenses: Not on file  Food Insecurity:   . Worried About Charity fundraiser in the Last Year: Not on file  . Ran Out of Food in the Last Year: Not on file  Transportation Needs:   . Lack of Transportation (Medical): Not on file  . Lack of Transportation (Non-Medical): Not on file  Physical Activity:   . Days of Exercise per Week: Not on file  . Minutes of Exercise per Session: Not on file  Stress: No Stress Concern Present  . Feeling of Stress : Not at all  Social Connections:   . Frequency of Communication with Friends and Family: Not on file  . Frequency of Social Gatherings with Friends and  Family: Not on file  . Attends Religious Services: Not on file  . Active Member of Clubs or Organizations: Not on file  . Attends Archivist Meetings: Not on file  . Marital Status: Not on file  Intimate Partner Violence:   . Fear of Current or Ex-Partner: Not on file  . Emotionally Abused: Not on file  . Physically Abused: Not on file  . Sexually Abused: Not on file    Past Medical History:  Diagnosis Date  . Arthritis    left ankle  . Brain tumor (benign) ([place])   . COPD (chronic obstructive pulmonary disease) ([place])   . GERD (gastroesophageal reflux disease)   . Heart murmur   . Hypertension   . Seasonal allergies      Patient Active Problem List   Diagnosis Date Noted  . LVH (left ventricular hypertrophy) due to hypertensive disease, without heart failure 11/03/2018  . Chronic kidney disease 08/16/2015  . Supraventricular tachycardia ([place]) 08/16/2015  . Allergic rhinitis 08/04/2015  . Airway hyperreactivity 08/04/2015  . Benign fibroma of prostate 08/04/2015  . CAFL (chronic airflow limitation) ([place]) 08/04/2015  . Essential (primary) hypertension 08/04/2015  . Acid reflux 08/04/2015  . Glaucoma 08/04/2015  . HLD (hyperlipidemia) 08/04/2015  . Mild cognitive disorder 08/04/2015  . Arthritis, degenerative 08/04/2015  . Asymptomatic varicose veins 08/04/2015  .  TI (tricuspid incompetence) 11/21/2014  . Paroxysmal atrial fibrillation ([place]) 11/19/2014  . Esophagitis, reflux 11/08/2014  . MI (mitral incompetence) 11/08/2014  . Nonspecific (abnormal) findings on radiological and other examination of gastrointestinal tract 03/27/2012    Past Surgical History:  Procedure Laterality Date  . EUS  03/27/2012   Procedure: UPPER ENDOSCOPIC ULTRASOUND (EUS) LINEAR;  Surgeon: Milus Banister, MD;  Location: [facility];  Service: Endoscopy;  Laterality: N/A;  Raidal EUS  . FINE NEEDLE ASPIRATION  03/27/2012   Procedure: FINE NEEDLE ASPIRATION (FNA) LINEAR;  Surgeon:  Milus Banister, MD;  Location: [facility];  Service: Endoscopy;  Laterality: N/A;  . HERNIA REPAIR    . ORIF ANKLE FRACTURE      His family history includes Arthritis in his mother; Breast cancer in his sister and sister; Heart disease in his mother; Hypertension in his mother.   Current Outpatient Medications:  .  amoxicillin (AMOXIL) 500 MG tablet, Take 500 mg by mouth as directed. 4 tablets prior to dental procedures., Disp: , Rfl:  .  aspirin 81 MG tablet, Take 81 mg by mouth daily., Disp: , Rfl:  .  Brinzolamide-Brimonidine (SIMBRINZA) 1-0.2 % SUSP, Place 3 drops into both eyes daily. As needed, Disp: , Rfl:  .  carboxymethylcellulose (REFRESH PLUS) 0.5 % SOLN, Place 1 drop into both eyes 2 (two) times daily as needed. , Disp: , Rfl:  .  cetirizine (ZYRTEC) 10 MG tablet, Take 10 mg by mouth daily., Disp: , Rfl:  .  fluticasone (FLONASE) 50 MCG/ACT nasal spray, TAKE TWO PUFFS EACH NOSTRIL DAILY., Disp: 48 g, Rfl: 3 .  HYDROcodone-acetaminophen (NORCO) 10-325 MG tablet, Take 1 tablet by mouth 4 (four) times daily as needed. Reviewed controlled substance record from PMP Alert. (Patient not taking: Reported on 09/21/2019), Disp: 100 tablet, Rfl: 0 .  HYDROcodone-acetaminophen (NORCO) 7.5-325 MG tablet, Take 1 tablet by mouth every 6 (six) hours as needed for moderate pain. (Patient taking differently: Take 0.25 tablets by mouth every 6 (six) hours as needed for moderate pain. ), Disp: 30 tablet, Rfl: 0 .  latanoprost (XALATAN) 0.005 % ophthalmic solution, Place 1 drop at bedtime into both eyes., Disp: , Rfl:  .  metoprolol tartrate (LOPRESSOR) 25 MG tablet, Take 0.5 tablets (12.5 mg total) by mouth 3 (three) times daily with meals. (Patient taking differently: Take 12.5 mg by mouth 2 (two) times daily. ), Disp: , Rfl:  .  mometasone (ASMANEX) 220 MCG/INH inhaler, Inhale 1 puff into the lungs daily. , Disp: , Rfl:  .  Multiple Vitamin (MULTI-VITAMINS) TABS, Take daily by mouth. , Disp: , Rfl:    .  Multiple Vitamins-Minerals (PRESERVISION AREDS 2 PO), Take by mouth 2 (two) times daily. , Disp: , Rfl:  .  neomycin-polymyxin b-dexamethasone (MAXITROL) 3.5-10000-0.1 SUSP, Place 1 drop into both eyes as needed. , Disp: , Rfl:  .  Netarsudil Dimesylate 0.02 % SOLN, Apply 1 drop to eye at bedtime., Disp: , Rfl:  .  pantoprazole (PROTONIX) 40 MG tablet, Take 1 tablet (40 mg total) by mouth 2 (two) times daily. (Patient taking differently: Take 40 mg by mouth daily. ), Disp: 180 tablet, Rfl: 1 .  polyethylene glycol (MIRALAX / GLYCOLAX) 17 g packet, Take 17 g by mouth daily as needed. , Disp: , Rfl:  .  pravastatin (PRAVACHOL) 20 MG tablet, Take 20 mg by mouth daily. , Disp: , Rfl:  .  terazosin (HYTRIN) 5 MG capsule, Take 5 mg by mouth at bedtime., Disp: ,  Rfl:  .  timolol (BETIMOL) 0.25 % ophthalmic solution, 1-2 drops 2 (two) times daily., Disp: , Rfl:  .  vitamin B-12 (CYANOCOBALAMIN) 500 MCG tablet, Take 500 mcg by mouth daily., Disp: , Rfl:  .  Wheat Dextrin (BENEFIBER PO), Take by mouth daily. When remembers, Disp: , Rfl:   Patient Care Team: Jerrol Banana., MD as PCP - General (Family Medicine) Beacher May, MD as Referring Physician (Internal Medicine) Charletta Cousin, MD as Referring Physician (Ophthalmology) Corey Skains, MD as Consulting Physician (Cardiology) Chrismon, Vickki Muff, [place] (Family Medicine)     Objective:    Vitals: There were no vitals taken for this visit.  Physical Exam  Activities of Daily Living In your present state of health, do you have any difficulty performing the following activities: 09/21/2019  Hearing? Y  Comment Wears hearing aids in both ears.  Vision? N  Difficulty concentrating or making decisions? N  Walking or climbing stairs? N  Dressing or bathing? N  Doing errands, shopping? N  Preparing Food and eating ? N  Using the Toilet? N  In the past six months, have you accidently leaked urine? N  Do you have  problems with loss of bowel control? N  Managing your Medications? N  Managing your Finances? N  Housekeeping or managing your Housekeeping? N  Some recent data might be hidden    Fall Risk Assessment Fall Risk  09/21/2019 09/09/2019 09/05/2018 09/05/2018 08/23/2017  Falls in the past year? 0 0 1 1 No  Comment - Emmi Telephone Survey: data to providers prior to load - - -  Number falls in past yr: 0 - 0 0 -  Injury with Fall? 0 - 0 0 -  Follow up - - - Falls prevention discussed -     Depression Screen PHQ 2/9 Scores 09/21/2019 09/05/2018 09/05/2018 09/05/2018  PHQ - 2 Score 0 0 0 0  PHQ- 9 Score - - 0 -    6CIT Screen 09/21/2019  What Year? 0 points  What month? 0 points  What time? 0 points  Count back from 20 0 points  Months in reverse 0 points  Repeat phrase 0 points  Total Score 0       Assessment & Plan:    Annual Physical Reviewed patient's Family Medical History Reviewed and updated list of patient's medical providers Assessment of cognitive impairment was done Assessed patient's functional ability Established a written schedule for health screening [place] Completed and Reviewed  Exercise Activities and Dietary recommendations Goals    . Exercise 3x per week (30 min per time)     Recommend to exercise for 3 days a week for at least 30 minutes at a time.      . Increase water intake     Recommend drinking 6-8 glasses of water daily.        Immunization History  Administered Date(s) Administered  . Influenza, High Dose Seasonal PF 09/09/2016, 07/27/2019  . Influenza-Unspecified 08/11/2017, 07/21/2018  . Pneumococcal Conjugate-13 08/16/2014  . Pneumococcal Polysaccharide-23 02/15/2015  . Td 01/24/2004, 04/16/2011, 05/25/2016  . Tdap 04/16/2011    Health Maintenance  Topic Date Due  . Samul Dada  05/25/2026  . INFLUENZA VACCINE  Completed  . PNA vac Low Risk Adult  Completed     Discussed health benefits of physical  activity, and encouraged him to engage in regular exercise appropriate for his age and condition.    ------------------------------------------------------------------------------------------------------------    Delfino Lovett  Cranford Mon, MD  [facility]

## 2019-10-26 ENCOUNTER — Encounter: Payer: PPO | Admitting: Family Medicine

## 2019-11-27 NOTE — Progress Notes (Signed)
Patient: Mark Sanchez., Male    DOB: November 30, 1932, 86 y.o.   MRN: [id number] Visit Date: 11/30/2019  Today's Provider: Wilhemena Durie, MD   Chief Complaint  Patient presents with  . Annual Exam   Subjective:   AWE was w/ [facility] on 09/21/2019.  Complete Physical Mark Sanchez. is a 86 y.o. male. He feels well. He reports exercising includes walking and light weight lifting. He reports he is sleeping well. Patient has felt well since starting on pantoprazole.  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  He has had his first Covid vaccine. -----------------------------------------------------------  Colonoscopy: 01/05/2002  Review of Systems  Constitutional: Negative for diaphoresis and fatigue.  HENT: Positive for tinnitus.   Eyes: Negative.   Respiratory: Negative.   Cardiovascular: Negative.   Endocrine: Negative.   Musculoskeletal: Positive for arthralgias.  Allergic/Immunologic: Positive for environmental allergies.  Neurological: Negative.   Hematological: Negative.   Psychiatric/Behavioral: Negative.     Social History   Socioeconomic History  . Marital status: Married    Spouse name: Not on file  . Number of children: 1  . Years of education: Not on file  . Highest education level: High school graduate  Occupational History  . Occupation: part time  Tobacco Use  . Smoking status: Former Smoker    Types: Cigars    Quit date: 10/15/1999    Years since quitting: 20.1  . Smokeless tobacco: Never Used  . Tobacco comment: quit about 15 years ago  Substance and Sexual Activity  . Alcohol use: Yes    Comment: occasionally, 1-2 drinks monthly  . Drug use: No  . Sexual activity: Not Currently  Other Topics Concern  . Not on file  Social History Narrative  . Not on file   Social Determinants of Health   Financial Resource Strain:   . Difficulty of Paying Living Expenses: Not on file  Food Insecurity:   . Worried About Charity fundraiser in  the Last Year: Not on file  . Ran Out of Food in the Last Year: Not on file  Transportation Needs:   . Lack of Transportation (Medical): Not on file  . Lack of Transportation (Non-Medical): Not on file  Physical Activity:   . Days of Exercise per Week: Not on file  . Minutes of Exercise per Session: Not on file  Stress: No Stress Concern Present  . Feeling of Stress : Not at all  Social Connections:   . Frequency of Communication with Friends and Family: Not on file  . Frequency of Social Gatherings with Friends and Family: Not on file  . Attends Religious Services: Not on file  . Active Member of Clubs or Organizations: Not on file  . Attends Archivist Meetings: Not on file  . Marital Status: Not on file  Intimate Partner Violence:   . Fear of Current or Ex-Partner: Not on file  . Emotionally Abused: Not on file  . Physically Abused: Not on file  . Sexually Abused: Not on file    Past Medical History:  Diagnosis Date  . Arthritis    left ankle  . Brain tumor (benign) ([place])   . COPD (chronic obstructive pulmonary disease) ([place])   . GERD (gastroesophageal reflux disease)   . Heart murmur   . Hypertension   . Seasonal allergies      Patient Active Problem List   Diagnosis Date Noted  . LVH (left ventricular hypertrophy)  due to hypertensive disease, without heart failure 11/03/2018  . Chronic kidney disease 08/16/2015  . Supraventricular tachycardia ([place]) 08/16/2015  . Allergic rhinitis 08/04/2015  . Airway hyperreactivity 08/04/2015  . Benign fibroma of prostate 08/04/2015  . CAFL (chronic airflow limitation) ([place]) 08/04/2015  . Essential (primary) hypertension 08/04/2015  . Acid reflux 08/04/2015  . Glaucoma 08/04/2015  . HLD (hyperlipidemia) 08/04/2015  . Mild cognitive disorder 08/04/2015  . Arthritis, degenerative 08/04/2015  . Asymptomatic varicose veins 08/04/2015  . TI (tricuspid incompetence) 11/21/2014  . Paroxysmal atrial fibrillation ([place])  11/19/2014  . Esophagitis, reflux 11/08/2014  . MI (mitral incompetence) 11/08/2014  . Nonspecific (abnormal) findings on radiological and other examination of gastrointestinal tract 03/27/2012    Past Surgical History:  Procedure Laterality Date  . EUS  03/27/2012   Procedure: UPPER ENDOSCOPIC ULTRASOUND (EUS) LINEAR;  Surgeon: Milus Banister, MD;  Location: [facility];  Service: Endoscopy;  Laterality: N/A;  Raidal EUS  . FINE NEEDLE ASPIRATION  03/27/2012   Procedure: FINE NEEDLE ASPIRATION (FNA) LINEAR;  Surgeon: Milus Banister, MD;  Location: [facility];  Service: Endoscopy;  Laterality: N/A;  . HERNIA REPAIR    . ORIF ANKLE FRACTURE      His family history includes Arthritis in his mother; Breast cancer in his sister and sister; Heart disease in his mother; Hypertension in his mother.   Current Outpatient Medications:  .  aspirin 81 MG tablet, Take 81 mg by mouth daily., Disp: , Rfl:  .  Brinzolamide-Brimonidine (SIMBRINZA) 1-0.2 % SUSP, Place 3 drops into both eyes daily. As needed, Disp: , Rfl:  .  carboxymethylcellulose (REFRESH PLUS) 0.5 % SOLN, Place 1 drop into both eyes 2 (two) times daily as needed. , Disp: , Rfl:  .  cetirizine (ZYRTEC) 10 MG tablet, Take 10 mg by mouth daily., Disp: , Rfl:  .  fluticasone (FLONASE) 50 MCG/ACT nasal spray, TAKE TWO PUFFS EACH NOSTRIL DAILY., Disp: 48 g, Rfl: 3 .  latanoprost (XALATAN) 0.005 % ophthalmic solution, Place 1 drop at bedtime into both eyes., Disp: , Rfl:  .  metoprolol tartrate (LOPRESSOR) 25 MG tablet, Take 0.5 tablets (12.5 mg total) by mouth 3 (three) times daily with meals. (Patient taking differently: Take 12.5 mg by mouth 2 (two) times daily. ), Disp: , Rfl:  .  mometasone (ASMANEX) 220 MCG/INH inhaler, Inhale 1 puff into the lungs daily. , Disp: , Rfl:  .  Multiple Vitamin (MULTI-VITAMINS) TABS, Take daily by mouth. , Disp: , Rfl:  .  Multiple Vitamins-Minerals (PRESERVISION AREDS 2 PO), Take by mouth 2 (two) times  daily. , Disp: , Rfl:  .  neomycin-polymyxin b-dexamethasone (MAXITROL) 3.5-10000-0.1 SUSP, Place 1 drop into both eyes as needed. , Disp: , Rfl:  .  Netarsudil Dimesylate 0.02 % SOLN, Apply 1 drop to eye at bedtime., Disp: , Rfl:  .  pantoprazole (PROTONIX) 40 MG tablet, Take 1 tablet (40 mg total) by mouth 2 (two) times daily. (Patient taking differently: Take 40 mg by mouth daily. ), Disp: 180 tablet, Rfl: 1 .  polyethylene glycol (MIRALAX / GLYCOLAX) 17 g packet, Take 17 g by mouth daily as needed. , Disp: , Rfl:  .  pravastatin (PRAVACHOL) 20 MG tablet, Take 20 mg by mouth daily. , Disp: , Rfl:  .  terazosin (HYTRIN) 5 MG capsule, Take 5 mg by mouth at bedtime., Disp: , Rfl:  .  timolol (BETIMOL) 0.25 % ophthalmic solution, 1-2 drops 2 (two) times daily., Disp: , Rfl:  .  vitamin B-12 (CYANOCOBALAMIN) 500 MCG tablet, Take 500 mcg by mouth daily., Disp: , Rfl:  .  Wheat Dextrin (BENEFIBER PO), Take by mouth daily. When remembers, Disp: , Rfl:  .  amoxicillin (AMOXIL) 500 MG tablet, Take 500 mg by mouth as directed. 4 tablets prior to dental procedures., Disp: , Rfl:  .  HYDROcodone-acetaminophen (NORCO) 10-325 MG tablet, Take 1 tablet by mouth 4 (four) times daily as needed. Reviewed controlled substance record from PMP Alert. (Patient not taking: Reported on 09/21/2019), Disp: 100 tablet, Rfl: 0 .  HYDROcodone-acetaminophen (NORCO) 7.5-325 MG tablet, Take 1 tablet by mouth every 6 (six) hours as needed for moderate pain. (Patient not taking: Reported on 11/30/2019), Disp: 30 tablet, Rfl: 0  Patient Care Team: Jerrol Banana., MD as PCP - General (Family Medicine) Beacher May, MD as Referring Physician (Internal Medicine) Yong Channel, Skip Mayer, MD as Referring Physician (Ophthalmology) Corey Skains, MD as Consulting Physician (Cardiology) Chrismon, Vickki Muff, [place] (Family Medicine)     Objective:    Vitals: BP 128/60 (BP Location: Left Arm, Patient Position: Sitting,  Cuff Size: Normal)   Pulse 75   Temp (!) 96.8 F (36 C) (Temporal)   Ht 5\' 8"  (1.727 m)   Wt 157 lb 6.4 oz (71.4 kg)   SpO2 97%   BMI 23.93 kg/m   Physical Exam Vitals reviewed.  Constitutional:      Appearance: Normal appearance.  HENT:     Head: Normocephalic and atraumatic.     Right Ear: External ear normal.     Left Ear: External ear normal.  Eyes:     General: No scleral icterus.    Conjunctiva/sclera: Conjunctivae normal.  Cardiovascular:     Rate and Rhythm: Normal rate and regular rhythm.     Heart sounds: Normal heart sounds.  Pulmonary:     Effort: Pulmonary effort is normal.     Breath sounds: Normal breath sounds.  Abdominal:     Palpations: Abdomen is soft. There is no mass.  Musculoskeletal:        General: Deformity present.     Comments: Chronic swelling from old traumatic left ankle fracture.  Skin:    General: Skin is warm and dry.  Neurological:     General: No focal deficit present.     Mental Status: He is alert and oriented to person, place, and time.  Psychiatric:        Mood and Affect: Mood normal.        Behavior: Behavior normal.        Thought Content: Thought content normal.        Judgment: Judgment normal.     Activities of Daily Living In your present state of health, do you have any difficulty performing the following activities: 09/21/2019  Hearing? Y  Comment Wears hearing aids in both ears.  Vision? N  Difficulty concentrating or making decisions? N  Walking or climbing stairs? N  Dressing or bathing? N  Doing errands, shopping? N  Preparing Food and eating ? N  Using the Toilet? N  In the past six months, have you accidently leaked urine? N  Do you have problems with loss of bowel control? N  Managing your Medications? N  Managing your Finances? N  Housekeeping or managing your Housekeeping? N  Some recent data might be hidden    Fall Risk Assessment Fall Risk  09/21/2019 09/09/2019 09/05/2018 09/05/2018 08/23/2017   Falls in the past year? 0 0 1  1 No  Comment - Emmi Telephone Survey: data to providers prior to load - - -  Number falls in past yr: 0 - 0 0 -  Injury with Fall? 0 - 0 0 -  Follow up - - - Falls prevention discussed -     Depression Screen PHQ 2/9 Scores 09/21/2019 09/05/2018 09/05/2018 09/05/2018  PHQ - 2 Score 0 0 0 0  PHQ- 9 Score - - 0 -    6CIT Screen 09/21/2019  What Year? 0 points  What month? 0 points  What time? 0 points  Count back from 20 0 points  Months in reverse 0 points  Repeat phrase 0 points  Total Score 0       Assessment & Plan:    Annual Physical Reviewed patient's Family Medical History Reviewed and updated list of patient's medical providers Assessment of cognitive impairment was done Assessed patient's functional ability Established a written schedule for health screening [place] Completed and Reviewed  Exercise Activities and Dietary recommendations Goals    . Exercise 3x per week (30 min per time)     Recommend to exercise for 3 days a week for at least 30 minutes at a time.      . Increase water intake     Recommend drinking 6-8 glasses of water daily.        Immunization History  Administered Date(s) Administered  . Influenza, High Dose Seasonal PF 09/09/2016, 07/27/2019  . Influenza-Unspecified 08/11/2017, 07/21/2018  . Pneumococcal Conjugate-13 08/16/2014  . Pneumococcal Polysaccharide-23 02/15/2015  . Td 01/24/2004, 04/16/2011, 05/25/2016  . Tdap 04/16/2011    Health Maintenance  Topic Date Due  . Samul Dada  05/25/2026  . INFLUENZA VACCINE  Completed  . PNA vac Low Risk Adult  Completed     Discussed health benefits of physical activity, and encouraged him to engage in regular exercise appropriate for his age and condition.   1. Annual physical exam At 70 patient declines GU and DRE exam  2. Essential (primary) hypertension Good control. - CBC with Differential/Platelet - Comprehensive  metabolic panel  3. Asymptomatic varicose veins   4. Gastroesophageal reflux disease without esophagitis   5. Benign fibroma of prostate   6. Chronic kidney disease, unspecified CKD stage Of labs. - CBC with Differential/Platelet - Comprehensive metabolic panel  7. Mixed hyperlipidemia   8. Hypothyroidism, unspecified type  - CBC with Differential/Platelet - Comprehensive metabolic panel - TSH  ------------------------------------------------------------------------------------------------------------    Wilhemena Durie, MD  [facility]

## 2019-11-30 ENCOUNTER — Ambulatory Visit (INDEPENDENT_AMBULATORY_CARE_PROVIDER_SITE_OTHER): Payer: PPO | Admitting: Family Medicine

## 2019-11-30 ENCOUNTER — Other Ambulatory Visit: Payer: Self-pay

## 2019-11-30 ENCOUNTER — Encounter: Payer: Self-pay | Admitting: Family Medicine

## 2019-11-30 VITALS — BP 128/60 | HR 75 | Temp 96.8°F | Ht 68.0 in | Wt 157.4 lb

## 2019-11-30 DIAGNOSIS — N189 Chronic kidney disease, unspecified: Secondary | ICD-10-CM

## 2019-11-30 DIAGNOSIS — Z0000 Encounter for general adult medical examination without abnormal findings: Secondary | ICD-10-CM

## 2019-11-30 DIAGNOSIS — N400 Enlarged prostate without lower urinary tract symptoms: Secondary | ICD-10-CM | POA: Diagnosis not present

## 2019-11-30 DIAGNOSIS — E782 Mixed hyperlipidemia: Secondary | ICD-10-CM

## 2019-11-30 DIAGNOSIS — I8390 Asymptomatic varicose veins of unspecified lower extremity: Secondary | ICD-10-CM | POA: Diagnosis not present

## 2019-11-30 DIAGNOSIS — K219 Gastro-esophageal reflux disease without esophagitis: Secondary | ICD-10-CM

## 2019-11-30 DIAGNOSIS — I10 Essential (primary) hypertension: Secondary | ICD-10-CM | POA: Diagnosis not present

## 2019-11-30 DIAGNOSIS — E039 Hypothyroidism, unspecified: Secondary | ICD-10-CM | POA: Diagnosis not present

## 2019-12-01 LAB — CBC WITH DIFFERENTIAL/PLATELET
Basophils Absolute: 0.1 10*3/uL (ref 0.0–0.2)
Basos: 1 %
EOS (ABSOLUTE): 0.4 10*3/uL (ref 0.0–0.4)
Eos: 5 %
Hematocrit: 44.9 % (ref 37.5–51.0)
Hemoglobin: 14.7 g/dL (ref 13.0–17.7)
Immature Grans (Abs): 0 10*3/uL (ref 0.0–0.1)
Immature Granulocytes: 0 %
Lymphocytes Absolute: 1.3 10*3/uL (ref 0.7–3.1)
Lymphs: 16 %
MCH: 30.4 pg (ref 26.6–33.0)
MCHC: 32.7 g/dL (ref 31.5–35.7)
MCV: 93 fL (ref 79–97)
Monocytes Absolute: 0.5 10*3/uL (ref 0.1–0.9)
Monocytes: 6 %
Neutrophils Absolute: 5.7 10*3/uL (ref 1.4–7.0)
Neutrophils: 72 %
Platelets: 186 10*3/uL (ref 150–450)
RBC: 4.84 x10E6/uL (ref 4.14–5.80)
RDW: 12.5 % (ref 11.6–15.4)
WBC: 7.9 10*3/uL (ref 3.4–10.8)

## 2019-12-01 LAB — COMPREHENSIVE METABOLIC PANEL
ALT: 9 IU/L (ref 0–44)
AST: 16 IU/L (ref 0–40)
Albumin/Globulin Ratio: 1.5 (ref 1.2–2.2)
Albumin: 4.2 g/dL (ref 3.6–4.6)
Alkaline Phosphatase: 84 IU/L (ref 39–117)
BUN/Creatinine Ratio: 17 (ref 10–24)
BUN: 15 mg/dL (ref 8–27)
Bilirubin Total: 0.5 mg/dL (ref 0.0–1.2)
CO2: 23 mmol/L (ref 20–29)
Calcium: 9.2 mg/dL (ref 8.6–10.2)
Chloride: 104 mmol/L (ref 96–106)
Creatinine, Ser: 0.87 mg/dL (ref 0.76–1.27)
GFR calc Af Amer: 92 mL/min/{1.73_m2} (ref >59)
GFR calc non Af Amer: 79 mL/min/{1.73_m2} (ref >59)
Globulin, Total: 2.8 g/dL (ref 1.5–4.5)
Glucose: 95 mg/dL (ref 65–99)
Potassium: 4.2 mmol/L (ref 3.5–5.2)
Sodium: 139 mmol/L (ref 134–144)
Total Protein: 7 g/dL (ref 6.0–8.5)

## 2019-12-01 LAB — TSH: TSH: 5.73 u[IU]/mL — ABNORMAL HIGH (ref 0.450–4.500)

## 2019-12-07 DIAGNOSIS — D225 Melanocytic nevi of trunk: Secondary | ICD-10-CM | POA: Diagnosis not present

## 2019-12-07 DIAGNOSIS — L821 Other seborrheic keratosis: Secondary | ICD-10-CM | POA: Diagnosis not present

## 2019-12-07 DIAGNOSIS — D2272 Melanocytic nevi of left lower limb, including hip: Secondary | ICD-10-CM | POA: Diagnosis not present

## 2019-12-07 DIAGNOSIS — D2262 Melanocytic nevi of left upper limb, including shoulder: Secondary | ICD-10-CM | POA: Diagnosis not present

## 2019-12-07 DIAGNOSIS — L570 Actinic keratosis: Secondary | ICD-10-CM | POA: Diagnosis not present

## 2019-12-07 DIAGNOSIS — X32XXXA Exposure to sunlight, initial encounter: Secondary | ICD-10-CM | POA: Diagnosis not present

## 2019-12-07 DIAGNOSIS — D2261 Melanocytic nevi of right upper limb, including shoulder: Secondary | ICD-10-CM | POA: Diagnosis not present

## 2019-12-07 DIAGNOSIS — D2271 Melanocytic nevi of right lower limb, including hip: Secondary | ICD-10-CM | POA: Diagnosis not present

## 2019-12-21 NOTE — Progress Notes (Signed)
Patient: Mark Sanchez. Male    DOB: 04/07/1933   86 y.o.   MRN: [id number] Visit Date: 12/22/2019  Today's Provider: Wilhemena Durie, MD   Chief Complaint  Patient presents with  . Dysuria   Subjective:     Dysuria  This is a new problem. The current episode started in the past 7 days. The problem occurs intermittently. The problem has been unchanged. The quality of the pain is described as burning. The pain is mild. Associated symptoms include flank pain, frequency and urgency. Pertinent negatives include no chills, discharge, hematuria, hesitancy, nausea, possible pregnancy, sweats or vomiting. The treatment provided no relief.     Patient states he has had burning in prostate area and slight burning upon urination for about one week. Patient also states he has some frequency and urgency. Also slight pain on right lower back. no blood. Patient states he has been treating symptoms with Cranberry juice and increased water intake.  No fever chills or  hematuria.  Allergies  Allergen Reactions  . Iodinated Diagnostic Agents Other (See Comments)  . Sulfa Antibiotics Itching and Rash    Allergic reaction was over 30years ago. Patient unsure of exact reaction     Current Outpatient Medications:  .  amoxicillin (AMOXIL) 500 MG tablet, Take 500 mg by mouth as directed. 4 tablets prior to dental procedures., Disp: , Rfl:  .  aspirin 81 MG tablet, Take 81 mg by mouth daily., Disp: , Rfl:  .  Brinzolamide-Brimonidine (SIMBRINZA) 1-0.2 % SUSP, Place 3 drops into both eyes daily. As needed, Disp: , Rfl:  .  carboxymethylcellulose (REFRESH PLUS) 0.5 % SOLN, Place 1 drop into both eyes 2 (two) times daily as needed. , Disp: , Rfl:  .  cetirizine (ZYRTEC) 10 MG tablet, Take 10 mg by mouth daily., Disp: , Rfl:  .  fluticasone (FLONASE) 50 MCG/ACT nasal spray, TAKE TWO PUFFS EACH NOSTRIL DAILY., Disp: 48 g, Rfl: 3 .  latanoprost (XALATAN) 0.005 % ophthalmic solution, Place 1 drop at  bedtime into both eyes., Disp: , Rfl:  .  metoprolol tartrate (LOPRESSOR) 25 MG tablet, Take 0.5 tablets (12.5 mg total) by mouth 3 (three) times daily with meals. (Patient taking differently: Take 12.5 mg by mouth 2 (two) times daily. ), Disp: , Rfl:  .  mometasone (ASMANEX) 220 MCG/INH inhaler, Inhale 1 puff into the lungs daily. , Disp: , Rfl:  .  Multiple Vitamin (MULTI-VITAMINS) TABS, Take daily by mouth. , Disp: , Rfl:  .  Multiple Vitamins-Minerals (PRESERVISION AREDS 2 PO), Take by mouth 2 (two) times daily. , Disp: , Rfl:  .  neomycin-polymyxin b-dexamethasone (MAXITROL) 3.5-10000-0.1 SUSP, Place 1 drop into both eyes as needed. , Disp: , Rfl:  .  Netarsudil Dimesylate 0.02 % SOLN, Apply 1 drop to eye at bedtime., Disp: , Rfl:  .  pantoprazole (PROTONIX) 40 MG tablet, Take 1 tablet (40 mg total) by mouth 2 (two) times daily. (Patient taking differently: Take 40 mg by mouth daily. ), Disp: 180 tablet, Rfl: 1 .  polyethylene glycol (MIRALAX / GLYCOLAX) 17 g packet, Take 17 g by mouth daily as needed. , Disp: , Rfl:  .  pravastatin (PRAVACHOL) 20 MG tablet, Take 20 mg by mouth daily. , Disp: , Rfl:  .  terazosin (HYTRIN) 5 MG capsule, Take 5 mg by mouth at bedtime., Disp: , Rfl:  .  timolol (BETIMOL) 0.25 % ophthalmic solution, 1-2 drops 2 (two) times daily.,  Disp: , Rfl:  .  vitamin B-12 (CYANOCOBALAMIN) 500 MCG tablet, Take 500 mcg by mouth daily., Disp: , Rfl:  .  Wheat Dextrin (BENEFIBER PO), Take by mouth daily. When remembers, Disp: , Rfl:  .  ciprofloxacin (CIPRO) 500 MG tablet, Take 1 tablet (500 mg total) by mouth 2 (two) times daily., Disp: 20 tablet, Rfl: 0 .  HYDROcodone-acetaminophen (NORCO) 10-325 MG tablet, Take 1 tablet by mouth 4 (four) times daily as needed. Reviewed controlled substance record from PMP Alert. (Patient not taking: Reported on 09/21/2019), Disp: 100 tablet, Rfl: 0 .  HYDROcodone-acetaminophen (NORCO) 7.5-325 MG tablet, Take 1 tablet by mouth every 6 (six) hours  as needed for moderate pain. (Patient not taking: Reported on 11/30/2019), Disp: 30 tablet, Rfl: 0  Review of Systems  Constitutional: Negative for appetite change, chills and fever.  Eyes: Negative.   Respiratory: Negative for chest tightness, shortness of breath and wheezing.   Cardiovascular: Negative for chest pain and palpitations.  Gastrointestinal: Negative for abdominal pain, nausea and vomiting.  Endocrine: Negative.   Genitourinary: Positive for dysuria, flank pain, frequency and urgency. Negative for hematuria and hesitancy.  Allergic/Immunologic: Negative.   Hematological: Negative.   Psychiatric/Behavioral: Negative.     Social History   Tobacco Use  . Smoking status: Former Smoker    Types: Cigars    Quit date: 10/15/1999    Years since quitting: 20.2  . Smokeless tobacco: Never Used  . Tobacco comment: quit about 15 years ago  Substance Use Topics  . Alcohol use: Yes    Comment: occasionally, 1-2 drinks monthly      Objective:   BP (!) 152/63 (BP Location: Right Arm, Patient Position: Sitting, Cuff Size: Large)   Pulse 60   Temp (!) 97.3 F (36.3 C) (Other (Comment))   Resp 18   Ht 5\' 8"  (1.727 m)   Wt 159 lb (72.1 kg)   SpO2 97%   BMI 24.18 kg/m  Vitals:   12/22/19 1034  BP: (!) 152/63  Pulse: 60  Resp: 18  Temp: (!) 97.3 F (36.3 C)  TempSrc: Other (Comment)  SpO2: 97%  Weight: 159 lb (72.1 kg)  Height: 5\' 8"  (1.727 m)  Body mass index is 24.18 kg/m.   Physical Exam Vitals reviewed.  HENT:     Right Ear: External ear normal.     Left Ear: External ear normal.  Eyes:     General: No scleral icterus.    Conjunctiva/sclera: Conjunctivae normal.  Cardiovascular:     Heart sounds: Normal heart sounds.  Pulmonary:     Effort: Pulmonary effort is normal.     Breath sounds: Normal breath sounds.  Abdominal:     Palpations: Abdomen is soft.     Tenderness: There is no right CVA tenderness or left CVA tenderness.  Genitourinary:    Penis:  Normal.      Testes: Normal.     Prostate: Normal.     Rectum: Normal.  Neurological:     General: No focal deficit present.     Mental Status: He is alert and oriented to person, place, and time.  Psychiatric:        Mood and Affect: Mood normal.        Behavior: Behavior normal.        Thought Content: Thought content normal.        Judgment: Judgment normal.      Results for orders placed or performed in visit on 12/22/19  POCT urinalysis  dipstick  Result Value Ref Range   Color, UA Yellow    Clarity, UA Clear    Glucose, UA Negative Negative   Bilirubin, UA Negative    Ketones, UA Negative    Spec Grav, UA 1.010 1.010 - 1.025   Blood, UA Negative    pH, UA 6.0 5.0 - 8.0   Protein, UA Negative Negative   Urobilinogen, UA 0.2 0.2 or 1.0 E.U./dL   Nitrite, UA Negative    Leukocytes, UA Negative Negative   Appearance Normal    Odor Normal        Assessment & Plan    1. Acute prostatitis Start Cipro 500 mg bid #20.  - ciprofloxacin (CIPRO) 500 MG tablet; Take 1 tablet (500 mg total) by mouth 2 (two) times daily.  Dispense: 20 tablet; Refill: 0  2. Dysuria  - POCT urinalysis dipstick-Normal.    Follow up in August as scheduled.     I,Garnet Chatmon,acting as a scribe for Wilhemena Durie, MD.,have documented all relevant documentation on the behalf of Wilhemena Durie, MD,as directed by  Wilhemena Durie, MD while in the presence of Wilhemena Durie, MD.      Wilhemena Durie, MD  [facility]

## 2019-12-22 ENCOUNTER — Ambulatory Visit (INDEPENDENT_AMBULATORY_CARE_PROVIDER_SITE_OTHER): Payer: PPO | Admitting: Family Medicine

## 2019-12-22 ENCOUNTER — Other Ambulatory Visit: Payer: Self-pay

## 2019-12-22 ENCOUNTER — Encounter: Payer: Self-pay | Admitting: Family Medicine

## 2019-12-22 VITALS — BP 152/63 | HR 60 | Temp 97.3°F | Resp 18 | Ht 68.0 in | Wt 159.0 lb

## 2019-12-22 DIAGNOSIS — Z125 Encounter for screening for malignant neoplasm of prostate: Secondary | ICD-10-CM

## 2019-12-22 DIAGNOSIS — N410 Acute prostatitis: Secondary | ICD-10-CM

## 2019-12-22 DIAGNOSIS — R300 Dysuria: Secondary | ICD-10-CM | POA: Diagnosis not present

## 2019-12-22 LAB — POCT URINALYSIS DIPSTICK
Appearance: NORMAL
Bilirubin, UA: NEGATIVE
Blood, UA: NEGATIVE
Glucose, UA: NEGATIVE
Ketones, UA: NEGATIVE
Leukocytes, UA: NEGATIVE
Nitrite, UA: NEGATIVE
Odor: NORMAL
Protein, UA: NEGATIVE
Spec Grav, UA: 1.01 (ref 1.010–1.025)
Urobilinogen, UA: 0.2 E.U./dL
pH, UA: 6 (ref 5.0–8.0)

## 2019-12-22 LAB — IFOBT (OCCULT BLOOD): IFOBT: NEGATIVE

## 2019-12-22 MED ORDER — CIPROFLOXACIN HCL 500 MG PO TABS: 500.0000 mg | ORAL_TABLET | Freq: Two times a day (BID) | ORAL | 0 refills | Status: DC

## 2019-12-23 DIAGNOSIS — N410 Acute prostatitis: Secondary | ICD-10-CM | POA: Diagnosis not present

## 2019-12-28 LAB — CULTURE, URINE COMPREHENSIVE

## 2019-12-29 ENCOUNTER — Telehealth: Payer: Self-pay

## 2019-12-29 NOTE — Telephone Encounter (Signed)
Patient's wife advised of labs.

## 2019-12-29 NOTE — Telephone Encounter (Signed)
-----   Message from Jerrol Banana., MD sent at 12/28/2019  5:00 PM EDT ----- Urine sensitive to the Cipro that was prescribed.

## 2020-01-26 ENCOUNTER — Telehealth: Payer: Self-pay
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
## 2020-01-26 NOTE — Telephone Encounter (Signed)
OV for pt for treatment options.

## 2020-01-26 NOTE — Telephone Encounter (Signed)
Called to schedule patient for OV for treatment options.

## 2020-01-27 NOTE — Telephone Encounter (Signed)
Pts wife called stating that the pt does not feel at this point that he can handle another appt at a pain management clinic. Pts wife states that the pt is undergoing glaucoma treatments and is having to make appt every week. Pts wife is requesting to have PCP contact pt to discuss further options. She states that pt is declining appt at this time.

## 2020-01-28 NOTE — Telephone Encounter (Signed)
I will be happy to see him next week about his chronic back pain. He can be an electronic visit or telephone visit if he likes. It is best if I can do an exam also.

## 2020-01-28 NOTE — Telephone Encounter (Signed)
[name] is going to speak with Mark Sanchez about appointment because he was not home, but she said she will return my call.

## 2020-05-25 NOTE — Progress Notes (Signed)
Established patient visit   Patient: Mark STEJSKAL Sr.   DOB: 1933-04-27   86 y.o. Male  MRN: [id number] Visit Date: 05/30/2020  Today's healthcare provider: Wilhemena Durie, MD   Chief Complaint  Patient presents with  . Hyperlipidemia  . Hypertension   [employer] as a scribe for Wilhemena Durie, MD.,have documented all relevant documentation on the behalf of Wilhemena Durie, MD,as directed by  Wilhemena Durie, MD while in the presence of Wilhemena Durie, MD.  Subjective    HPI  Overall patient doing well. Hypertension, follow-up  BP Readings from Last 3 Encounters:  05/30/20 (!) 174/74  12/22/19 (!) 152/63  11/30/19 128/60   Wt Readings from Last 3 Encounters:  05/30/20 159 lb 6.4 oz (72.3 kg)  12/22/19 159 lb (72.1 kg)  11/30/19 157 lb 6.4 oz (71.4 kg)     He was last seen for hypertension 6 months ago.  BP at that visit was 152/63. Management since that visit includes; Good control. He reports good compliance with treatment. He is not having side effects.  He is not exercising. He is adherent to low salt diet.   Outside blood pressures are being checked occasionally. BP readings are typically higher in the mornings.  He does not smoke.  Use of agents associated with hypertension: none.   --------------------------------------------------------------------------------------------------- Lipid/Cholesterol, follow-up  Last Lipid Panel: Lab Results  Component Value Date   CHOL 124 06/26/2019   LDLCALC 65 06/26/2019   HDL 50 06/26/2019   TRIG 35 06/26/2019    He was last seen for this 11 months ago.  Management since that visit includes;labs checked showing-okay.  He reports good compliance with treatment. He is not having side effects.   Symptoms: No appetite changes No foot ulcerations  No chest pain No chest pressure/discomfort  No dyspnea No orthopnea  No fatigue No lower extremity edema  No palpitations No  paroxysmal nocturnal dyspnea  No nausea No numbness or tingling of extremity  No polydipsia No polyuria  No speech difficulty No syncope   He is following a Regular diet. Current exercise: walking and weightlifting  Last metabolic panel Lab Results  Component Value Date   GLUCOSE 95 11/30/2019   NA 139 11/30/2019   K 4.2 11/30/2019   BUN 15 11/30/2019   CREATININE 0.87 11/30/2019   GFRNONAA 79 11/30/2019   GFRAA 92 11/30/2019   CALCIUM 9.2 11/30/2019   AST 16 11/30/2019   ALT 9 11/30/2019   The ASCVD Risk score Mikey Bussing DC Jr., et al., 2013) failed to calculate for the following reasons:   The 2013 ASCVD risk score is only valid for ages 71 to 79  ---------------------------------------------------------------------------------------------------  Chronic kidney disease, unspecified CKD stage From 11/30/2019-labs checked showing-okay.      Medications: Outpatient Medications Prior to Visit  Medication Sig  . amoxicillin (AMOXIL) 500 MG tablet Take 500 mg by mouth as directed. 4 tablets prior to dental procedures.  Marland Kitchen aspirin 81 MG tablet Take 81 mg by mouth daily.  . Brinzolamide-Brimonidine (SIMBRINZA) 1-0.2 % SUSP Place 3 drops into both eyes daily. As needed  . carboxymethylcellulose (REFRESH PLUS) 0.5 % SOLN Place 1 drop into both eyes 2 (two) times daily as needed.   . cetirizine (ZYRTEC) 10 MG tablet Take 10 mg by mouth daily.  . fluticasone (FLONASE) 50 MCG/ACT nasal spray TAKE TWO PUFFS EACH NOSTRIL DAILY.  Marland Kitchen latanoprost (XALATAN) 0.005 % ophthalmic solution Place 1 drop at bedtime into  both eyes.  . metoprolol tartrate (LOPRESSOR) 25 MG tablet Take 0.5 tablets (12.5 mg total) by mouth 3 (three) times daily with meals. (Patient taking differently: Take 12.5 mg by mouth 2 (two) times daily. )  . mometasone (ASMANEX) 220 MCG/INH inhaler Inhale 1 puff into the lungs daily.   . Multiple Vitamin (MULTI-VITAMINS) TABS Take daily by mouth.   . Multiple Vitamins-Minerals  (PRESERVISION AREDS 2 PO) Take by mouth 2 (two) times daily.   Marland Kitchen neomycin-polymyxin b-dexamethasone (MAXITROL) 3.5-10000-0.1 SUSP Place 1 drop into both eyes as needed.   . Netarsudil Dimesylate 0.02 % SOLN Apply 1 drop to eye at bedtime.  . pantoprazole (PROTONIX) 40 MG tablet Take 1 tablet (40 mg total) by mouth 2 (two) times daily. (Patient taking differently: Take 40 mg by mouth daily. )  . polyethylene glycol (MIRALAX / GLYCOLAX) 17 g packet Take 17 g by mouth daily as needed.   . pravastatin (PRAVACHOL) 20 MG tablet Take 20 mg by mouth daily.   Marland Kitchen terazosin (HYTRIN) 5 MG capsule Take 5 mg by mouth at bedtime.  . timolol (BETIMOL) 0.25 % ophthalmic solution 1-2 drops 2 (two) times daily.  . vitamin B-12 (CYANOCOBALAMIN) 500 MCG tablet Take 500 mcg by mouth daily.  . Wheat Dextrin (BENEFIBER PO) Take by mouth daily. When remembers  . [DISCONTINUED] ciprofloxacin (CIPRO) 500 MG tablet Take 1 tablet (500 mg total) by mouth 2 (two) times daily.  . [DISCONTINUED] HYDROcodone-acetaminophen (NORCO) 10-325 MG tablet Take 1 tablet by mouth 4 (four) times daily as needed. Reviewed controlled substance record from PMP Alert. (Patient not taking: Reported on 09/21/2019)  . [DISCONTINUED] HYDROcodone-acetaminophen (NORCO) 7.5-325 MG tablet Take 1 tablet by mouth every 6 (six) hours as needed for moderate pain. (Patient not taking: Reported on 11/30/2019)   No facility-administered medications prior to visit.    Review of Systems  Constitutional: Negative for appetite change, chills and fever.  Respiratory: Negative for chest tightness, shortness of breath and wheezing.   Cardiovascular: Negative for chest pain and palpitations.  Gastrointestinal: Negative for abdominal pain, nausea and vomiting.       Objective    BP (!) 174/74 (BP Location: Right Arm, Patient Position: Sitting, Cuff Size: Normal)   Pulse 66   Temp 97.6 F (36.4 C) (Oral)   Ht 5\' 8"  (1.727 m)   Wt 159 lb 6.4 oz (72.3 kg)   BMI  24.24 kg/m     Physical Exam Vitals reviewed.  Constitutional:      Appearance: Normal appearance.  HENT:     Head: Normocephalic and atraumatic.     Right Ear: External ear normal.     Left Ear: External ear normal.  Eyes:     General: No scleral icterus.    Conjunctiva/sclera: Conjunctivae normal.  Cardiovascular:     Rate and Rhythm: Normal rate and regular rhythm.     Heart sounds: Normal heart sounds.  Pulmonary:     Effort: Pulmonary effort is normal.     Breath sounds: Normal breath sounds.  Abdominal:     Palpations: Abdomen is soft. There is no mass.  Skin:    General: Skin is warm and dry.  Neurological:     General: No focal deficit present.     Mental Status: He is alert and oriented to person, place, and time.  Psychiatric:        Mood and Affect: Mood normal.        Behavior: Behavior normal.  Thought Content: Thought content normal.        Judgment: Judgment normal.      General: Appearance:    Eyes:     Lungs:     Heart:       MS:    Neurologic:        No results found for any visits on 05/30/20.  Assessment & Plan    1. Essential (primary) hypertension Start Telmisartan 20 mg at bedtime. Follow up in 2 months - Comprehensive Metabolic Panel (CMET) - telmisartan (MICARDIS) 20 MG tablet; Take 1 tablet (20 mg total) by mouth at bedtime.  Dispense: 90 tablet; Refill: 0  2. Mixed hyperlipidemia  - Lipid Profile  3. Chronic kidney disease, unspecified CKD stage  - Comprehensive Metabolic Panel (CMET) 4.  History of retroperitoneal fibrosis. Patient evidently has brain MRI next month. Return in about 2 months (around 07/30/2020).          Armonee Bojanowski Cranford Mon, MD  [facility] [phone] (phone) [phone] (fax)  [place]

## 2020-05-30 ENCOUNTER — Encounter: Payer: Self-pay | Admitting: Family Medicine

## 2020-05-30 ENCOUNTER — Other Ambulatory Visit: Payer: Self-pay

## 2020-05-30 ENCOUNTER — Ambulatory Visit (INDEPENDENT_AMBULATORY_CARE_PROVIDER_SITE_OTHER): Payer: PPO | Admitting: Family Medicine

## 2020-05-30 VITALS — BP 174/74 | HR 66 | Temp 97.6°F | Ht 68.0 in | Wt 159.4 lb

## 2020-05-30 DIAGNOSIS — N189 Chronic kidney disease, unspecified: Secondary | ICD-10-CM | POA: Diagnosis not present

## 2020-05-30 DIAGNOSIS — Z8719 Personal history of other diseases of the digestive system: Secondary | ICD-10-CM

## 2020-05-30 DIAGNOSIS — I10 Essential (primary) hypertension: Secondary | ICD-10-CM | POA: Diagnosis not present

## 2020-05-30 DIAGNOSIS — Z87898 Personal history of other specified conditions: Secondary | ICD-10-CM | POA: Diagnosis not present

## 2020-05-30 DIAGNOSIS — Z87448 Personal history of other diseases of urinary system: Secondary | ICD-10-CM

## 2020-05-30 DIAGNOSIS — E782 Mixed hyperlipidemia: Secondary | ICD-10-CM

## 2020-05-30 MED ORDER — TELMISARTAN 20 MG PO TABS: 20.0000 mg | ORAL_TABLET | Freq: Every day | ORAL | 0 refills | Status: DC

## 2020-05-31 LAB — COMPREHENSIVE METABOLIC PANEL
ALT: 7 IU/L (ref 0–44)
AST: 12 IU/L (ref 0–40)
Albumin/Globulin Ratio: 1.5 (ref 1.2–2.2)
Albumin: 3.8 g/dL (ref 3.6–4.6)
Alkaline Phosphatase: 77 IU/L (ref 48–121)
BUN/Creatinine Ratio: 17 (ref 10–24)
BUN: 17 mg/dL (ref 8–27)
Bilirubin Total: 0.3 mg/dL (ref 0.0–1.2)
CO2: 23 mmol/L (ref 20–29)
Calcium: 8.8 mg/dL (ref 8.6–10.2)
Chloride: 106 mmol/L (ref 96–106)
Creatinine, Ser: 0.99 mg/dL (ref 0.76–1.27)
GFR calc Af Amer: 80 mL/min/{1.73_m2} (ref >59)
GFR calc non Af Amer: 69 mL/min/{1.73_m2} (ref >59)
Globulin, Total: 2.6 g/dL (ref 1.5–4.5)
Glucose: 97 mg/dL (ref 65–99)
Potassium: 4.2 mmol/L (ref 3.5–5.2)
Sodium: 141 mmol/L (ref 134–144)
Total Protein: 6.4 g/dL (ref 6.0–8.5)

## 2020-05-31 LAB — LIPID PANEL
Chol/HDL Ratio: 2.8 ratio (ref 0.0–5.0)
Cholesterol, Total: 139 mg/dL (ref 100–199)
HDL: 49 mg/dL (ref >39)
LDL Chol Calc (NIH): 79 mg/dL (ref 0–99)
Triglycerides: 51 mg/dL (ref 0–149)
VLDL Cholesterol Cal: 11 mg/dL (ref 5–40)

## 2020-07-21 NOTE — Progress Notes (Signed)
I,Mark Sanchez,acting as a scribe for Mark Durie, MD.,have documented all relevant documentation on the behalf of Mark Durie, MD,as directed by  Mark Durie, MD while in the presence of Mark Durie, MD.   Established patient visit   Patient: Mark Sanchez.   DOB: 21-Sep-1933   86 y.o. Male  MRN: [id number] Visit Date: 07/25/2020  Today's healthcare provider: Wilhemena Durie, MD   Chief Complaint  Patient presents with  . Follow-up  . Hypertension   Subjective    HPI  Patient comes in today for follow-up.  Blood pressures continue to run a little bit high.  He has no other complaints.  He did have a brain MRI at the [place] 10 days ago and was told in the past that unless it gets a lot larger and may treat with radiation but otherwise we will leave it alone.  He evidently has a type of brain tumor.  We will attempt to get these records.  He is asymptomatic.  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  He is taking all medications as prescribed. Hypertension, follow-up  BP Readings from Last 3 Encounters:  07/25/20 (!) 172/65  05/30/20 (!) 174/74  12/22/19 (!) 152/63   Wt Readings from Last 3 Encounters:  07/25/20 161 lb (73 kg)  05/30/20 159 lb 6.4 oz (72.3 kg)  12/22/19 159 lb (72.1 kg)     He was last seen for hypertension 2 months ago.  BP at that visit was 174/74. Management since that visit includes; Start Telmisartan 20 mg at bedtime. He reports good compliance with treatment. He is not having side effects. none He is exercising. He is adherent to low salt diet.   Outside blood pressures are up and down.  He does not smoke.  Use of agents associated with hypertension: none.   -------------------------------------------------------------------       Medications: Outpatient Medications Prior to Visit  Medication Sig  . amoxicillin (AMOXIL) 500 MG tablet Take 500 mg by mouth as directed. 4 tablets  prior to dental procedures.  Marland Kitchen aspirin 81 MG tablet Take 81 mg by mouth daily.  . Brinzolamide-Brimonidine (SIMBRINZA) 1-0.2 % SUSP Place 3 drops into both eyes daily. As needed  . carboxymethylcellulose (REFRESH PLUS) 0.5 % SOLN Place 1 drop into both eyes 2 (two) times daily as needed.   . cetirizine (ZYRTEC) 10 MG tablet Take 10 mg by mouth daily.  . fluticasone (FLONASE) 50 MCG/ACT nasal spray TAKE TWO PUFFS EACH NOSTRIL DAILY.  Marland Kitchen latanoprost (XALATAN) 0.005 % ophthalmic solution Place 1 drop at bedtime into both eyes.  . metoprolol tartrate (LOPRESSOR) 25 MG tablet Take 0.5 tablets (12.5 mg total) by mouth 3 (three) times daily with meals. (Patient taking differently: Take 12.5 mg by mouth 2 (two) times daily. )  . mometasone (ASMANEX) 220 MCG/INH inhaler Inhale 1 puff into the lungs daily.   . Multiple Vitamin (MULTI-VITAMINS) TABS Take daily by mouth.   . Multiple Vitamins-Minerals (PRESERVISION AREDS 2 PO) Take by mouth 2 (two) times daily.   Marland Kitchen neomycin-polymyxin b-dexamethasone (MAXITROL) 3.5-10000-0.1 SUSP Place 1 drop into both eyes as needed.   . Netarsudil Dimesylate 0.02 % SOLN Apply 1 drop to eye at bedtime.  . pantoprazole (PROTONIX) 40 MG tablet Take 1 tablet (40 mg total) by mouth 2 (two) times daily. (Patient taking differently: Take 40 mg by mouth daily. )  . polyethylene glycol (MIRALAX /  GLYCOLAX) 17 g packet Take 17 g by mouth daily as needed.   . pravastatin (PRAVACHOL) 20 MG tablet Take 20 mg by mouth daily.   Marland Kitchen telmisartan (MICARDIS) 20 MG tablet Take 1 tablet (20 mg total) by mouth at bedtime.  Marland Kitchen terazosin (HYTRIN) 5 MG capsule Take 5 mg by mouth at bedtime.  . timolol (BETIMOL) 0.25 % ophthalmic solution 1-2 drops 2 (two) times daily.  . vitamin B-12 (CYANOCOBALAMIN) 500 MCG tablet Take 500 mcg by mouth daily.  . Wheat Dextrin (BENEFIBER PO) Take by mouth daily. When remembers   No facility-administered medications prior to visit.    Review of Systems   Constitutional: Negative for appetite change, chills and fever.  Respiratory: Negative for chest tightness, shortness of breath and wheezing.   Cardiovascular: Negative for chest pain and palpitations.  Gastrointestinal: Negative for abdominal pain, nausea and vomiting.       Objective    BP (!) 172/65 (BP Location: Left Arm, Patient Position: Sitting, Cuff Size: Large)   Pulse 64   Temp 97.7 F (36.5 C) (Oral)   Resp 16   Ht 5\' 8"  (1.727 m)   Wt 161 lb (73 kg)   SpO2 99%   BMI 24.48 kg/m  BP Readings from Last 3 Encounters:  07/25/20 (!) 172/65  05/30/20 (!) 174/74  12/22/19 (!) 152/63   Wt Readings from Last 3 Encounters:  07/25/20 161 lb (73 kg)  05/30/20 159 lb 6.4 oz (72.3 kg)  12/22/19 159 lb (72.1 kg)      Physical Exam Vitals reviewed.  Constitutional:      Appearance: Normal appearance.  HENT:     Head: Normocephalic and atraumatic.     Right Ear: External ear normal.     Left Ear: External ear normal.  Eyes:     General: No scleral icterus.    Conjunctiva/sclera: Conjunctivae normal.  Cardiovascular:     Rate and Rhythm: Normal rate and regular rhythm.     Heart sounds: Normal heart sounds.  Pulmonary:     Effort: Pulmonary effort is normal.     Breath sounds: Normal breath sounds.  Abdominal:     Palpations: Abdomen is soft. There is no mass.  Skin:    General: Skin is warm and dry.  Neurological:     General: No focal deficit present.     Mental Status: He is alert and oriented to person, place, and time.  Psychiatric:        Mood and Affect: Mood normal.        Behavior: Behavior normal.        Thought Content: Thought content normal.        Judgment: Judgment normal.       No results found for any visits on 07/25/20.  Assessment & Plan     1. Essential (primary) hypertension And amlodipine 5 mg daily and return to clinic 2 to 3 months.  Consider switching to verapamil or to tolerate chiasm due to atrial fibrillation history and SVT  history.  Continue telmisartan  2. Need for influenza vaccination  - Flu Vaccine QUAD High Dose(Fluad)  3. Paroxysmal atrial fibrillation (HCC)   4. Supraventricular tachycardia ([place])   5. 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  7. Mild cognitive disorder MMSE on next visit  8. History of retroperitoneal fibrosis Clinically stable. 9.  Hypercholesterolemia    No follow-ups on file.      I,  Bryant Saye Cranford Mon, MD, have reviewed all documentation for this visit. The documentation on 07/25/20 for the exam, diagnosis, procedures, and orders are all accurate and complete.    Mohammed Mcandrew Cranford Mon, MD  [facility] [phone] (phone) [phone] (fax)  [place]

## 2020-07-25 ENCOUNTER — Encounter: Payer: Self-pay | Admitting: Family Medicine

## 2020-07-25 ENCOUNTER — Ambulatory Visit (INDEPENDENT_AMBULATORY_CARE_PROVIDER_SITE_OTHER): Payer: PPO | Admitting: Family Medicine

## 2020-07-25 ENCOUNTER — Other Ambulatory Visit: Payer: Self-pay

## 2020-07-25 VITALS — BP 172/65 | HR 64 | Temp 97.7°F | Resp 16 | Ht 68.0 in | Wt 161.0 lb

## 2020-07-25 DIAGNOSIS — F09 Unspecified mental disorder due to known physiological condition: Secondary | ICD-10-CM | POA: Diagnosis not present

## 2020-07-25 DIAGNOSIS — J449 Chronic obstructive pulmonary disease, unspecified: Secondary | ICD-10-CM

## 2020-07-25 DIAGNOSIS — I10 Essential (primary) hypertension: Secondary | ICD-10-CM

## 2020-07-25 DIAGNOSIS — I471 Supraventricular tachycardia, unspecified: Secondary | ICD-10-CM

## 2020-07-25 DIAGNOSIS — K219 Gastro-esophageal reflux disease without esophagitis: Secondary | ICD-10-CM | POA: Diagnosis not present

## 2020-07-25 DIAGNOSIS — Z23 Encounter for immunization: Secondary | ICD-10-CM

## 2020-07-25 DIAGNOSIS — D333 Benign neoplasm of cranial nerves: Secondary | ICD-10-CM

## 2020-07-25 DIAGNOSIS — Z87448 Personal history of other diseases of urinary system: Secondary | ICD-10-CM

## 2020-07-25 DIAGNOSIS — E7800 Pure hypercholesterolemia, unspecified: Secondary | ICD-10-CM | POA: Diagnosis not present

## 2020-07-25 DIAGNOSIS — I480 Paroxysmal atrial fibrillation: Secondary | ICD-10-CM

## 2020-07-25 DIAGNOSIS — Z8719 Personal history of other diseases of the digestive system: Secondary | ICD-10-CM

## 2020-07-25 DIAGNOSIS — Z87898 Personal history of other specified conditions: Secondary | ICD-10-CM

## 2020-07-25 MED ORDER — AMLODIPINE BESYLATE 5 MG PO TABS: 5.0000 mg | ORAL_TABLET | Freq: Every day | ORAL | 3 refills | Status: DC

## 2020-08-18 ENCOUNTER — Other Ambulatory Visit: Payer: Self-pay | Admitting: Family Medicine

## 2020-08-18 DIAGNOSIS — K219 Gastro-esophageal reflux disease without esophagitis: Secondary | ICD-10-CM

## 2020-08-31 ENCOUNTER — Other Ambulatory Visit: Payer: Self-pay | Admitting: Family Medicine

## 2020-09-02 ENCOUNTER — Telehealth: Payer: Self-pay | Admitting: *Deleted

## 2020-09-02 NOTE — Chronic Care Management (AMB) (Signed)
  Chronic Care Management   Note  09/02/2020 Name: Mark SIVILS Sr. MRN: [id number] DOB: 01/13/1933  Mark Ends Sr. is a 86 y.o. year old male who is a primary care patient of Jerrol Banana., MD. I reached out to [place]. by phone today in response to a referral sent by [name]'s health plan.     [name] was given information about Chronic Care Management services today including:  1. CCM service includes personalized support from designated clinical staff supervised by his physician, including individualized plan of care and coordination with other care providers 2. 24/7 contact phone numbers for assistance for urgent and routine care needs. 3. Service will only be billed when office clinical staff spend 20 minutes or more in a month to coordinate care. 4. Only one practitioner may furnish and bill the service in a calendar month. 5. The patient may stop CCM services at any time (effective at the end of the month) by phone call to the office staff. 6. The patient will be responsible for cost sharing (co-pay) of up to 20% of the service fee (after annual deductible is met).  Patient agreed to services and verbal consent obtained.   Follow up plan: Telephone appointment with care management team member scheduled for: 09/19/2020  [place] Management  Direct Dial: [phone]

## 2020-09-19 ENCOUNTER — Telehealth: Payer: PPO

## 2020-10-11 ENCOUNTER — Ambulatory Visit: Payer: Self-pay

## 2020-10-11 NOTE — Chronic Care Management (AMB) (Signed)
  Chronic Care Management   Outreach Note   Name: Mark DAOUST Sr. MRN: [id number] DOB: 1933-06-23  Primary Care Provider: Maple Hudson., MD Reason for referral : Chronic Care Management   [name] was referred to the case management team for assistance with care management and care coordination. 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 Also indicated that outreach times would need to be coordinated when his spouse is available. We will update his enrollment status and gladly outreach if he requires routine assistance.    PLAN Will update enrollment status. The care management team will gladly outreach with [name] if he requires routine assistance.    [facility] [phone]

## 2020-10-24 ENCOUNTER — Other Ambulatory Visit: Payer: Self-pay | Admitting: Family Medicine

## 2020-10-24 DIAGNOSIS — I10 Essential (primary) hypertension: Secondary | ICD-10-CM

## 2020-10-24 NOTE — Telephone Encounter (Signed)
Pt given 30 day supply.  Pt has upcoming appt.  Requested Prescriptions  Pending Prescriptions Disp Refills  . telmisartan (MICARDIS) 20 MG tablet [Pharmacy Med Name: TELMISARTAN 20 MG TABLET] 30 tablet 0    Sig: Take 1 tablet (20 mg total) by mouth at bedtime.     Cardiovascular:  Angiotensin Receptor Blockers Failed - 10/24/2020  3:30 PM      Failed - Last BP in normal range    BP Readings from Last 1 Encounters:  07/25/20 (!) 172/65         Passed - Cr in normal range and within 180 days    Creat  Date Value Ref Range Status  08/30/2017 0.95 0.70 - 1.11 mg/dL Final    Comment:    For patients >21 years of age, the reference limit for Creatinine is approximately 13% higher for people identified as African-American. .    Creatinine, Ser  Date Value Ref Range Status  05/30/2020 0.99 0.76 - 1.27 mg/dL Final         Passed - K in normal range and within 180 days    Potassium  Date Value Ref Range Status  05/30/2020 4.2 3.5 - 5.2 mmol/L Final  01/10/2015 3.8 mmol/L Final    Comment:    3.5-5.1 NOTE: New Reference Range  12/21/14          Passed - Patient is not pregnant      Passed - Valid encounter within last 6 months    Recent Outpatient Visits          3 months ago Essential (primary) hypertension   [place] Jerrol Banana., MD   4 months ago Essential (primary) hypertension   [facility] Jerrol Banana., MD   10 months ago Acute prostatitis   [facility] Jerrol Banana., MD   10 months ago Annual physical exam   [facility] Jerrol Banana., MD   1 year ago Need for immunization against influenza   [facility] Jerrol Banana., MD      Future Appointments            In 1 week Jerrol Banana., MD [facility] [place]

## 2020-10-25 ENCOUNTER — Other Ambulatory Visit: Payer: Self-pay | Admitting: Family Medicine

## 2020-10-27 NOTE — Progress Notes (Signed)
Subjective:   Mark JANTZ Sr. is a 87 y.o. male who presents for Medicare Annual/Subsequent preventive examination.  I connected with Mark Sanchez today by telephone and verified that I am speaking with the correct person using two identifiers. Location patient: home Location provider: work Persons participating in the virtual visit: patient, provider.   I discussed the limitations, risks, security and privacy concerns of performing an evaluation and management service by telephone and the availability of in person appointments. I also discussed with the patient that there may be a patient responsible charge related to this service. The patient expressed understanding and verbally consented to this telephonic visit.    Interactive audio and video telecommunications were attempted between this provider and patient, however failed, due to patient having technical difficulties OR patient did not have access to video capability.  We continued and completed visit with audio only.   Review of Systems    N/A  Cardiac Risk Factors include: advanced age (>69men, >55 women);dyslipidemia;male gender;hypertension     Objective:    There were no vitals filed for this visit. There is no height or weight on file to calculate BMI.  Advanced Directives 10/31/2020 09/21/2019 09/05/2018 08/23/2017 07/12/2016 02/13/2016 03/27/2012  Does Patient Have a Medical Advance Directive? Yes Yes Yes Yes No Yes Patient has advance directive, copy not in chart  Type of Advance Directive Healthcare Power of [place];Living will Healthcare Power of [place];Living will Healthcare Power of [place];Living will Living will - - Healthcare Power of [place];Living will  Copy of Healthcare Power of Attorney in Chart? No - copy requested No - copy requested No - copy requested - - - -    Current Medications (verified) Outpatient Encounter Medications as of 10/31/2020  Medication Sig  . amLODipine (NORVASC) 5 MG tablet  Take 1 tablet (5 mg total) by mouth daily.  Marland Kitchen amoxicillin (AMOXIL) 500 MG tablet Take 500 mg by mouth as directed. 4 tablets prior to dental procedures.  Marland Kitchen aspirin 81 MG tablet Take 81 mg by mouth daily.  . Brinzolamide-Brimonidine (SIMBRINZA) 1-0.2 % SUSP Place 3 drops into both eyes daily. As needed  . carboxymethylcellulose (REFRESH PLUS) 0.5 % SOLN Place 1 drop into both eyes 2 (two) times daily as needed.   . cetirizine (ZYRTEC) 10 MG tablet Take 10 mg by mouth daily.  . fluticasone (FLONASE) 50 MCG/ACT nasal spray TAKE TWO PUFFS EACH NOSTRIL DAILY.  Marland Kitchen latanoprost (XALATAN) 0.005 % ophthalmic solution Place 1 drop at bedtime into both eyes.  . metoprolol tartrate (LOPRESSOR) 25 MG tablet Take 0.5 tablets (12.5 mg total) by mouth 3 (three) times daily with meals. (Patient taking differently: Take 12.5 mg by mouth 2 (two) times daily.)  . mometasone (ASMANEX) 220 MCG/INH inhaler Inhale 1 puff into the lungs daily.   . Multiple Vitamins-Minerals (PRESERVISION AREDS 2 PO) Take by mouth 2 (two) times daily.   Marland Kitchen neomycin-polymyxin b-dexamethasone (MAXITROL) 3.5-10000-0.1 SUSP Place 1 drop into both eyes as needed.   . Netarsudil Dimesylate 0.02 % SOLN Apply 1 drop to eye at bedtime.  . pantoprazole (PROTONIX) 40 MG tablet Take 1 tablet (40 mg total) by mouth 2 (two) timesdaily.  . polyethylene glycol (MIRALAX / GLYCOLAX) 17 g packet Take 17 g by mouth daily as needed.   . pravastatin (PRAVACHOL) 20 MG tablet Take 1 tablet (20 mg total) by mouth nightly  . telmisartan (MICARDIS) 20 MG tablet Take 1 tablet (20 mg total) by mouth at bedtime.  Marland Kitchen terazosin (HYTRIN) 5  MG capsule Take 5 mg by mouth at bedtime.  . Wheat Dextrin (BENEFIBER PO) Take by mouth daily. When remembers  . Multiple Vitamin (MULTI-VITAMINS) TABS Take daily by mouth.  (Patient not taking: Reported on 10/31/2020)  . timolol (BETIMOL) 0.25 % ophthalmic solution 1-2 drops 2 (two) times daily. (Patient not taking: Reported on 10/31/2020)   . vitamin B-12 (CYANOCOBALAMIN) 500 MCG tablet Take 500 mcg by mouth daily. (Patient not taking: Reported on 10/31/2020)   No facility-administered encounter medications on file as of 10/31/2020.    Allergies (verified) Iodinated diagnostic agents and Sulfa antibiotics   History: Past Medical History:  Diagnosis Date  . Arthritis    left ankle  . Brain tumor (benign) ([place])   . COPD (chronic obstructive pulmonary disease) ([place])   . GERD (gastroesophageal reflux disease)   . Heart murmur   . Hyperlipidemia   . Hypertension   . Seasonal allergies    Past Surgical History:  Procedure Laterality Date  . EUS  03/27/2012   Procedure: UPPER ENDOSCOPIC ULTRASOUND (EUS) LINEAR;  Surgeon: Milus Banister, MD;  Location: [facility];  Service: Endoscopy;  Laterality: N/A;  [place] EUS  . FINE NEEDLE ASPIRATION  03/27/2012   Procedure: FINE NEEDLE ASPIRATION (FNA) LINEAR;  Surgeon: Milus Banister, MD;  Location: [facility];  Service: Endoscopy;  Laterality: N/A;  . HERNIA REPAIR    . ORIF ANKLE FRACTURE     Family History  Problem Relation Age of Onset  . Heart disease Mother   . Hypertension Mother   . Arthritis Mother   . Breast cancer Sister   . Breast cancer Sister    Social History   Socioeconomic History  . Marital status: Married    Spouse name: Not on file  . Number of children: 1  . Years of education: Not on file  . Highest education level: High school graduate  Occupational History  . Occupation: part time  Tobacco Use  . Smoking status: Former Smoker    Types: Cigars    Quit date: 10/15/1999    Years since quitting: 21.0  . Smokeless tobacco: Never Used  . Tobacco comment: quit about 15 years ago  Vaping Use  . Vaping Use: Never used  Substance and Sexual Activity  . Alcohol use: Yes    Comment: occasionally, 1-2 drinks monthly  . Drug use: No  . Sexual activity: Not Currently  Other Topics Concern  . Not on file  Social History Narrative  . Not on  file   Social Determinants of Health   Financial Resource Strain: Low Risk   . Difficulty of Paying Living Expenses: Not hard at all  Food Insecurity: No Food Insecurity  . Worried About Charity fundraiser in the Last Year: Never true  . Ran Out of Food in the Last Year: Never true  Transportation Needs: No Transportation Needs  . Lack of Transportation (Medical): No  . Lack of Transportation (Non-Medical): No  Physical Activity: Insufficiently Active  . Days of Exercise per Week: 2 days  . Minutes of Exercise per Session: 30 min  Stress: No Stress Concern Present  . Feeling of Stress : Not at all  Social Connections: Moderately Integrated  . Frequency of Communication with Friends and Family: More than three times a week  . Frequency of Social Gatherings with Friends and Family: More than three times a week  . Attends Religious Services: More than 4 times per year  . Active Member of Clubs  or Organizations: No  . Attends Archivist Meetings: Never  . Marital Status: Married    Tobacco Counseling Counseling given: Not Answered Comment: quit about 15 years ago   Clinical Intake:  Pre-visit preparation completed: Yes  Pain : No/denies pain     Nutritional Risks: None Diabetes: No  How often do you need to have someone help you when you read instructions, pamphlets, or other written materials from your doctor or pharmacy?: 1 - Never  Diabetic? No  Interpreter Needed?: No  Information entered by :: [employer], LPN   Activities of Daily Living In your present state of health, do you have any difficulty performing the following activities: 10/31/2020 07/25/2020  Hearing? Tempie Donning  Comment Wears bilateral hearing aids. -  Vision? Y N  Comment Due to glaucoma. -  Difficulty concentrating or making decisions? N N  Walking or climbing stairs? N N  Dressing or bathing? N N  Doing errands, shopping? N N  Preparing Food and eating ? N -  Using the Toilet? N -  In  the past six months, have you accidently leaked urine? N -  Do you have problems with loss of bowel control? N -  Managing your Medications? N -  Managing your Finances? N -  Housekeeping or managing your Housekeeping? N -  Some recent data might be hidden    Patient Care Team: Jerrol Banana., MD as PCP - General (Family Medicine) Beacher May, MD as Referring Physician (Internal Medicine) Charletta Cousin, MD as Referring Physician (Ophthalmology) Corey Skains, MD as Consulting Physician (Cardiology) Chrismon, Driscilla Grammes (Family Medicine) Dasher, Rayvon Char, MD (Dermatology)  Indicate any recent Medical Services you may have received from other than [facility] providers in the past year (date may be approximate).     Assessment:   This is a routine wellness examination for [place].  Hearing/Vision screen No exam data present  Dietary issues and exercise activities discussed: Current Exercise Habits: Home exercise routine, Type of exercise: walking;strength training/weights, Time (Minutes): 20 (to 30 minutes), Frequency (Times/Week): 2 (to 3 days), Weekly Exercise (Minutes/Week): 40, Exercise limited by: None identified  Goals    . Increase water intake     Recommend drinking 6-8 glasses of water daily.       Depression Screen PHQ 2/9 Scores 10/31/2020 07/25/2020 09/21/2019 09/05/2018 09/05/2018 09/05/2018 08/23/2017  PHQ - 2 Score 0 0 0 0 0 0 0  PHQ- 9 Score - 0 - - 0 - -    Fall Risk Fall Risk  10/31/2020 07/25/2020 09/21/2019 09/09/2019 09/05/2018  Falls in the past year? 0 0 0 0 1  Comment - - - Emmi Telephone Survey: data to providers prior to load -  Number falls in past yr: 0 0 0 - 0  Injury with Fall? 0 0 0 - 0  Follow up - Falls evaluation completed - - -    FALL RISK PREVENTION PERTAINING TO THE HOME:  Any stairs in or around the home? Yes  If so, are there any without handrails? No  Home free of loose throw rugs in walkways, pet beds,  electrical cords, etc? Yes  Adequate lighting in your home to reduce risk of falls? Yes   ASSISTIVE DEVICES UTILIZED TO PREVENT FALLS:  Life alert? No  Use of a cane, walker or w/c? No  Grab bars in the bathroom? No  Shower chair or bench in shower? No  Elevated toilet seat or a handicapped  toilet? No    Cognitive Function: Normal cognitive status assessed by direct observation by this Nurse Health Advisor. No abnormalities found.      6CIT Screen 09/21/2019 08/27/2016  What Year? 0 points 0 points  What month? 0 points 0 points  What time? 0 points 0 points  Count back from 20 0 points 0 points  Months in reverse 0 points 0 points  Repeat phrase 0 points 6 points  Total Score 0 6    Immunizations Immunization History  Administered Date(s) Administered  . Fluad Quad(high Dose 65+) 07/25/2020  . Influenza, High Dose Seasonal PF 09/09/2016, 07/27/2019  . Influenza-Unspecified 08/11/2017, 07/21/2018  . PFIZER Comirnaty(Gray Top)Covid-19 Tri-Sucrose Vaccine 11/13/2019, 12/02/2019, 08/15/2020  . Pneumococcal Conjugate-13 08/16/2014  . Pneumococcal Polysaccharide-23 02/15/2015  . Td 01/24/2004, 04/16/2011, 05/25/2016  . Tdap 04/16/2011    TDAP status: Up to date  Flu Vaccine status: Up to date  Pneumococcal vaccine status: Up to date  Covid-19 vaccine status: Completed vaccines  Qualifies for Shingles Vaccine? Yes   Zostavax completed No   Shingrix Completed?: No.    Education has been provided regarding the importance of this vaccine. Patient has been advised to call insurance company to determine out of pocket expense if they have not yet received this vaccine. Advised may also receive vaccine at local pharmacy or Health Dept. Verbalized acceptance and understanding.  Screening Tests Health Maintenance  Topic Date Due  . TETANUS/TDAP  05/25/2026  . INFLUENZA VACCINE  Completed  . COVID-19 Vaccine  Completed  . PNA vac Low Risk Adult  Completed    Health  Maintenance  There are no preventive care reminders to display for this patient.  Colorectal cancer screening: No longer required.   Lung Cancer Screening: (Low Dose CT Chest recommended if Age 35-80 years, 30 pack-year currently smoking OR have quit w/in 15years.) does not qualify.   Additional Screening:  Vision Screening: Recommended annual ophthalmology exams for early detection of glaucoma and other disorders of the eye. Is the patient up to date with their annual eye exam?  Yes  Who is the provider or what is the name of the office in which the patient attends annual eye exams? Dr Durene Cal @ [facility] If pt is not established with a provider, would they like to be referred to a provider to establish care? No .   Dental Screening: Recommended annual dental exams for proper oral hygiene  Community Resource Referral / Chronic Care Management: CRR required this visit?  No   CCM required this visit?  No      Plan:     I have personally reviewed and noted the following in the patient's chart:   . Medical and social history . Use of alcohol, tobacco or illicit drugs  . Current medications and supplements . Functional ability and status . Nutritional status . Physical activity . Advanced directives . List of other physicians . Hospitalizations, surgeries, and ER visits in previous 12 months . Vitals . Screenings to include cognitive, depression, and falls . Referrals and appointments  In addition, I have reviewed and discussed with patient certain preventive protocols, quality metrics, and best practice recommendations. A written personalized care plan for preventive services as well as general preventive health recommendations were provided to patient.     [employer]ag[employer] Botelho [place], [place]   [id number]/[id number]   Nurse Notes: None.

## 2020-10-31 ENCOUNTER — Ambulatory Visit (INDEPENDENT_AMBULATORY_CARE_PROVIDER_SITE_OTHER): Payer: PPO

## 2020-10-31 ENCOUNTER — Other Ambulatory Visit: Payer: Self-pay

## 2020-10-31 ENCOUNTER — Ambulatory Visit: Payer: Self-pay | Admitting: Family Medicine

## 2020-10-31 DIAGNOSIS — Z0000 Encounter for general adult medical examination without abnormal findings: Secondary | ICD-10-CM

## 2020-10-31 NOTE — Patient Instructions (Signed)
Mark Sanchez , Thank you for taking time to come for your Medicare Wellness Visit. I appreciate your ongoing commitment to your health goals. Please review the following plan we discussed and let me know if I can assist you in the future.   Screening recommendations/referrals: Colonoscopy: No longer required.  Recommended yearly ophthalmology/optometry visit for glaucoma screening and checkup Recommended yearly dental visit for hygiene and checkup  Vaccinations: Influenza vaccine: Done 07/25/20 Pneumococcal vaccine: Completed series Tdap vaccine: Up to date, due 05/2026 Shingles vaccine: Shingrix discussed. Please contact your pharmacy for coverage information.     Advanced directives: Please bring a copy of your POA (Power of Attorney) and/or Living Will to your next appointment.   Conditions/risks identified: Recommend drinking 6-8 glasses of water daily.   Next appointment: 11/07/20 @ 1:40 PM with Dr Rosanna Randy   Preventive Care 85 Years and Older, Male Preventive care refers to lifestyle choices and visits with your health care provider that can promote health and wellness. What does preventive care include?  A yearly physical exam. This is also called an annual well check.  Dental exams once or twice a year.  Routine eye exams. Ask your health care provider how often you should have your eyes checked.  Personal lifestyle choices, including:  Daily care of your teeth and gums.  Regular physical activity.  Eating a healthy diet.  Avoiding tobacco and drug use.  Limiting alcohol use.  Practicing safe sex.  Taking low doses of aspirin every day.  Taking vitamin and mineral supplements as recommended by your health care provider. What happens during an annual well check? The services and screenings done by your health care provider during your annual well check will depend on your age, overall health, lifestyle risk factors, and family history of disease. Counseling  Your  health care provider may ask you questions about your:  Alcohol use.  Tobacco use.  Drug use.  Emotional well-being.  Home and relationship well-being.  Sexual activity.  Eating habits.  History of falls.  Memory and ability to understand (cognition).  Work and work Statistician. Screening  You may have the following tests or measurements:  Height, weight, and BMI.  Blood pressure.  Lipid and cholesterol levels. These may be checked every 5 years, or more frequently if you are over 77 years old.  Skin check.  Lung cancer screening. You may have this screening every year starting at age 51 if you have a 30-pack-year history of smoking and currently smoke or have quit within the past 15 years.  Fecal occult blood test (FOBT) of the stool. You may have this test every year starting at age 35.  Flexible sigmoidoscopy or colonoscopy. You may have a sigmoidoscopy every 5 years or a colonoscopy every 10 years starting at age 73.  Prostate cancer screening. Recommendations will vary depending on your family history and other risks.  Hepatitis C blood test.  Hepatitis B blood test.  Sexually transmitted disease (STD) testing.  Diabetes screening. This is done by checking your blood sugar (glucose) after you have not eaten for a while (fasting). You may have this done every 1-3 years.  Abdominal aortic aneurysm (AAA) screening. You may need this if you are a current or former smoker.  Osteoporosis. You may be screened starting at age 30 if you are at high risk. Talk with your health care provider about your test results, treatment options, and if necessary, the need for more tests. Vaccines  Your health care provider may  recommend certain vaccines, such as:  Influenza vaccine. This is recommended every year.  Tetanus, diphtheria, and acellular pertussis (Tdap, Td) vaccine. You may need a Td booster every 10 years.  Zoster vaccine. You may need this after age  6.  Pneumococcal 13-valent conjugate (PCV13) vaccine. One dose is recommended after age 52.  Pneumococcal polysaccharide (PPSV23) vaccine. One dose is recommended after age 79. Talk to your health care provider about which screenings and vaccines you need and how often you need them. This information is not intended to replace advice given to you by your health care provider. Make sure you discuss any questions you have with your health care provider. Document Released: 10/28/2015 Document Revised: 06/20/2016 Document Reviewed: 08/02/2015 Elsevier Interactive Patient Education  2017 [place] Prevention in the Home Falls can cause injuries. They can happen to people of all ages. There are many things you can do to make your home safe and to help prevent falls. What can I do on the outside of my home?  Regularly fix the edges of walkways and driveways and fix any cracks.  Remove anything that might make you trip as you walk through a door, such as a raised step or threshold.  Trim any bushes or trees on the path to your home.  Use bright outdoor lighting.  Clear any walking paths of anything that might make someone trip, such as rocks or tools.  Regularly check to see if handrails are loose or broken. Make sure that both sides of any steps have handrails.  Any raised decks and porches should have guardrails on the edges.  Have any leaves, snow, or ice cleared regularly.  Use sand or salt on walking paths during winter.  Clean up any spills in your garage right away. This includes oil or grease spills. What can I do in the bathroom?  Use night lights.  Install grab bars by the toilet and in the tub and shower. Do not use towel bars as grab bars.  Use non-skid mats or decals in the tub or shower.  If you need to sit down in the shower, use a plastic, non-slip stool.  Keep the floor dry. Clean up any water that spills on the floor as soon as it happens.  Remove  soap buildup in the tub or shower regularly.  Attach bath mats securely with double-sided non-slip rug tape.  Do not have throw rugs and other things on the floor that can make you trip. What can I do in the bedroom?  Use night lights.  Make sure that you have a light by your bed that is easy to reach.  Do not use any sheets or blankets that are too big for your bed. They should not hang down onto the floor.  Have a firm chair that has side arms. You can use this for support while you get dressed.  Do not have throw rugs and other things on the floor that can make you trip. What can I do in the kitchen?  Clean up any spills right away.  Avoid walking on wet floors.  Keep items that you use a lot in easy-to-reach places.  If you need to reach something above you, use a strong step stool that has a grab bar.  Keep electrical cords out of the way.  Do not use floor polish or wax that makes floors slippery. If you must use wax, use non-skid floor wax.  Do not have throw rugs and  other things on the floor that can make you trip. What can I do with my stairs?  Do not leave any items on the stairs.  Make sure that there are handrails on both sides of the stairs and use them. Fix handrails that are broken or loose. Make sure that handrails are as long as the stairways.  Check any carpeting to make sure that it is firmly attached to the stairs. Fix any carpet that is loose or worn.  Avoid having throw rugs at the top or bottom of the stairs. If you do have throw rugs, attach them to the floor with carpet tape.  Make sure that you have a light switch at the top of the stairs and the bottom of the stairs. If you do not have them, ask someone to add them for you. What else can I do to help prevent falls?  Wear shoes that:  Do not have high heels.  Have rubber bottoms.  Are comfortable and fit you well.  Are closed at the toe. Do not wear sandals.  If you use a  stepladder:  Make sure that it is fully opened. Do not climb a closed stepladder.  Make sure that both sides of the stepladder are locked into place.  Ask someone to hold it for you, if possible.  Clearly mark and make sure that you can see:  Any grab bars or handrails.  First and last steps.  Where the edge of each step is.  Use tools that help you move around (mobility aids) if they are needed. These include:  Canes.  Walkers.  Scooters.  Crutches.  Turn on the lights when you go into a dark area. Replace any light bulbs as soon as they burn out.  Set up your furniture so you have a clear path. Avoid moving your furniture around.  If any of your floors are uneven, fix them.  If there are any pets around you, be aware of where they are.  Review your medicines with your doctor. Some medicines can make you feel dizzy. This can increase your chance of falling. Ask your doctor what other things that you can do to help prevent falls. This information is not intended to replace advice given to you by your health care provider. Make sure you discuss any questions you have with your health care provider. Document Released: 07/28/2009 Document Revised: 03/08/2016 Document Reviewed: 11/05/2014 Elsevier Interactive Patient Education  2017 [employer].

## 2020-11-04 NOTE — Progress Notes (Signed)
I,April Miller,acting as a scribe for Wilhemena Durie, MD.,have documented all relevant documentation on the behalf of Wilhemena Durie, MD,as directed by  Wilhemena Durie, MD while in the presence of Wilhemena Durie, MD.   Established patient visit   Patient: Mark SQUILLACE Sr.   DOB: October 27, 1932   87 y.o. Male  MRN: [id number] Visit Date: 11/07/2020  Today's healthcare provider: Wilhemena Durie, MD   Chief Complaint  Patient presents with  . Follow-up  . Hypertension   Subjective    HPI Patient feels well.  Overall he has no complaints.  He does not have the energy that he has in years past but no point concerns. Hypertension, follow-up  BP Readings from Last 3 Encounters:  11/07/20 (!) 134/57  07/25/20 (!) 172/65  05/30/20 (!) 174/74   Wt Readings from Last 3 Encounters:  11/07/20 159 lb (72.1 kg)  07/25/20 161 lb (73 kg)  05/30/20 159 lb 6.4 oz (72.3 kg)     He was last seen for hypertension 3 months ago.  BP at that visit was 172/65. Management since that visit includes; added amlodipine 5 mg daily and return to clinic 2 to 3 months.  Consider switching to verapamil or to tolerate chiasm due to atrial fibrillation history and SVT history.  Continue telmisartan. He reports good compliance with treatment. He is not having side effects. none He is exercising. He is adherent to low salt diet.   Outside blood pressures are 115/54.  He does not smoke.  Use of agents associated with hypertension: none.   -------------------------------------------------------------------- Patient states he is doing well on amlodipine.   Mild cognitive disorder From 07/25/2020-MMSE on next visit.       Medications: Outpatient Medications Prior to Visit  Medication Sig  . amLODipine (NORVASC) 5 MG tablet Take 1 tablet (5 mg total) by mouth daily.  Marland Kitchen amoxicillin (AMOXIL) 500 MG tablet Take 500 mg by mouth as directed. 4 tablets prior to dental procedures.  Marland Kitchen  aspirin 81 MG tablet Take 81 mg by mouth daily.  . Brinzolamide-Brimonidine (SIMBRINZA) 1-0.2 % SUSP Place 3 drops into both eyes daily. As needed  . carboxymethylcellulose (REFRESH PLUS) 0.5 % SOLN Place 1 drop into both eyes 2 (two) times daily as needed.   . cetirizine (ZYRTEC) 10 MG tablet Take 10 mg by mouth daily.  . fluticasone (FLONASE) 50 MCG/ACT nasal spray TAKE TWO PUFFS EACH NOSTRIL DAILY.  Marland Kitchen latanoprost (XALATAN) 0.005 % ophthalmic solution Place 1 drop at bedtime into both eyes.  . metoprolol tartrate (LOPRESSOR) 25 MG tablet Take 0.5 tablets (12.5 mg total) by mouth 3 (three) times daily with meals. (Patient taking differently: Take 12.5 mg by mouth 2 (two) times daily.)  . mometasone (ASMANEX) 220 MCG/INH inhaler Inhale 1 puff into the lungs daily.   . Multiple Vitamins-Minerals (PRESERVISION AREDS 2 PO) Take by mouth 2 (two) times daily.   Marland Kitchen neomycin-polymyxin b-dexamethasone (MAXITROL) 3.5-10000-0.1 SUSP Place 1 drop into both eyes as needed.   . Netarsudil Dimesylate 0.02 % SOLN Apply 1 drop to eye at bedtime.  . pantoprazole (PROTONIX) 40 MG tablet Take 1 tablet (40 mg total) by mouth 2 (two) timesdaily.  . polyethylene glycol (MIRALAX / GLYCOLAX) 17 g packet Take 17 g by mouth daily as needed.   . pravastatin (PRAVACHOL) 20 MG tablet Take 1 tablet (20 mg total) by mouth nightly  . telmisartan (MICARDIS) 20 MG tablet Take 1 tablet (20 mg total) by  mouth at bedtime.  Marland Kitchen terazosin (HYTRIN) 5 MG capsule Take 5 mg by mouth at bedtime.  . Wheat Dextrin (BENEFIBER PO) Take by mouth daily. When remembers  . Multiple Vitamin (MULTI-VITAMINS) TABS Take daily by mouth.  (Patient not taking: Reported on 11/07/2020)  . timolol (BETIMOL) 0.25 % ophthalmic solution 1-2 drops 2 (two) times daily. (Patient not taking: No sig reported)  . vitamin B-12 (CYANOCOBALAMIN) 500 MCG tablet Take 500 mcg by mouth daily. (Patient not taking: No sig reported)   No facility-administered medications prior  to visit.    Review of Systems  Constitutional: Negative for appetite change, chills and fever.  Respiratory: Negative for chest tightness, shortness of breath and wheezing.   Cardiovascular: Negative for chest pain and palpitations.  Gastrointestinal: Negative for abdominal pain, nausea and vomiting.        Objective    BP (!) 134/57 (BP Location: Right Arm, Patient Position: Sitting, Cuff Size: Large)   Pulse 63   Temp 97.7 F (36.5 C) (Oral)   Resp 18   Ht 5\' 8"  (1.727 m)   Wt 159 lb (72.1 kg)   SpO2 99%   BMI 24.18 kg/m  BP Readings from Last 3 Encounters:  11/07/20 (!) 134/57  07/25/20 (!) 172/65  05/30/20 (!) 174/74   Wt Readings from Last 3 Encounters:  11/07/20 159 lb (72.1 kg)  07/25/20 161 lb (73 kg)  05/30/20 159 lb 6.4 oz (72.3 kg)       Physical Exam    No results found for any visits on 11/07/20.  Assessment & Plan     1. Essential (primary) hypertension Advised to take amlodipine in the am.  2. Supraventricular tachycardia (HCC)/history of atrial fibrillation   3. Nonrheumatic tricuspid valve regurgitation Mild valvular disease followed by cardiology  4. History of retroperitoneal fibrosis Clinically stable   No follow-ups on file.      I, Wilhemena Durie, MD, have reviewed all documentation for this visit. The documentation on 11/12/20 for the exam, diagnosis, procedures, and orders are all accurate and complete.    Lourdes Kucharski Cranford Mon, MD  [facility] [phone] (phone) [phone] (fax)  [place]

## 2020-11-07 ENCOUNTER — Other Ambulatory Visit: Payer: Self-pay

## 2020-11-07 ENCOUNTER — Ambulatory Visit (INDEPENDENT_AMBULATORY_CARE_PROVIDER_SITE_OTHER): Payer: PPO | Admitting: Family Medicine

## 2020-11-07 ENCOUNTER — Encounter: Payer: Self-pay | Admitting: Family Medicine

## 2020-11-07 VITALS — BP 134/57 | HR 63 | Temp 97.7°F | Resp 18 | Ht 68.0 in | Wt 159.0 lb

## 2020-11-07 DIAGNOSIS — I361 Nonrheumatic tricuspid (valve) insufficiency: Secondary | ICD-10-CM | POA: Diagnosis not present

## 2020-11-07 DIAGNOSIS — Z87448 Personal history of other diseases of urinary system: Secondary | ICD-10-CM

## 2020-11-07 DIAGNOSIS — I471 Supraventricular tachycardia: Secondary | ICD-10-CM | POA: Diagnosis not present

## 2020-11-07 DIAGNOSIS — Z87898 Personal history of other specified conditions: Secondary | ICD-10-CM

## 2020-11-07 DIAGNOSIS — I10 Essential (primary) hypertension: Secondary | ICD-10-CM

## 2020-11-07 NOTE — Patient Instructions (Signed)
Take amlodipine in the the mornings.

## 2020-11-30 ENCOUNTER — Other Ambulatory Visit: Payer: Self-pay | Admitting: Family Medicine

## 2020-12-05 DIAGNOSIS — D225 Melanocytic nevi of trunk: Secondary | ICD-10-CM | POA: Diagnosis not present

## 2020-12-05 DIAGNOSIS — D485 Neoplasm of uncertain behavior of skin: Secondary | ICD-10-CM | POA: Diagnosis not present

## 2020-12-05 DIAGNOSIS — D0439 Carcinoma in situ of skin of other parts of face: Secondary | ICD-10-CM | POA: Diagnosis not present

## 2020-12-05 DIAGNOSIS — D2262 Melanocytic nevi of left upper limb, including shoulder: Secondary | ICD-10-CM | POA: Diagnosis not present

## 2020-12-05 DIAGNOSIS — L821 Other seborrheic keratosis: Secondary | ICD-10-CM | POA: Diagnosis not present

## 2020-12-05 DIAGNOSIS — D2272 Melanocytic nevi of left lower limb, including hip: Secondary | ICD-10-CM | POA: Diagnosis not present

## 2020-12-05 DIAGNOSIS — X32XXXA Exposure to sunlight, initial encounter: Secondary | ICD-10-CM | POA: Diagnosis not present

## 2020-12-05 DIAGNOSIS — D2271 Melanocytic nevi of right lower limb, including hip: Secondary | ICD-10-CM | POA: Diagnosis not present

## 2020-12-05 DIAGNOSIS — D2261 Melanocytic nevi of right upper limb, including shoulder: Secondary | ICD-10-CM | POA: Diagnosis not present

## 2020-12-05 DIAGNOSIS — L570 Actinic keratosis: Secondary | ICD-10-CM | POA: Diagnosis not present

## 2021-01-31 ENCOUNTER — Encounter: Payer: Self-pay | Admitting: Family Medicine

## 2021-01-31 ENCOUNTER — Telehealth: Payer: Self-pay

## 2021-01-31 DIAGNOSIS — I10 Essential (primary) hypertension: Secondary | ICD-10-CM

## 2021-01-31 MED ORDER — TELMISARTAN 20 MG PO TABS: 20.0000 mg | ORAL_TABLET | Freq: Every day | ORAL | 5 refills | Status: DC

## 2021-01-31 NOTE — Telephone Encounter (Signed)
Pt is requesting a refill on the following medication  telmisartan (MICARDIS) 20 MG tablet  He would like this sent to [facility].  He will be out after today.

## 2021-01-31 NOTE — Telephone Encounter (Signed)
Rx was sent to pharmacy. 

## 2021-02-13 ENCOUNTER — Encounter: Payer: Self-pay | Admitting: Family Medicine

## 2021-02-13 DIAGNOSIS — D0439 Carcinoma in situ of skin of other parts of face: Secondary | ICD-10-CM | POA: Diagnosis not present

## 2021-02-21 ENCOUNTER — Other Ambulatory Visit: Payer: Self-pay | Admitting: Family Medicine

## 2021-02-21 DIAGNOSIS — K219 Gastro-esophageal reflux disease without esophagitis: Secondary | ICD-10-CM

## 2021-02-21 NOTE — Telephone Encounter (Signed)
   Notes to clinic:  script was last filled on 08/18/2020 Review for continued use and refill    Requested Prescriptions  Pending Prescriptions Disp Refills   pantoprazole (PROTONIX) 40 MG tablet [Pharmacy Med Name: PANTOPRAZOLE SOD DR 40 MG TAB] 180 tablet 0    Sig: Take 1 tablet (40 mg total) by mouth 2 (two) times daily.      Gastroenterology: Proton Pump Inhibitors Passed - 02/21/2021 10:00 AM      Passed - Valid encounter within last 12 months    Recent Outpatient Visits           3 months ago Essential (primary) hypertension   [facility] Jerrol Banana., MD   7 months ago Essential (primary) hypertension   [facility] Jerrol Banana., MD   8 months ago Essential (primary) hypertension   [facility] Jerrol Banana., MD   1 year ago Acute prostatitis   [facility] Jerrol Banana., MD   1 year ago Annual physical exam   [facility] Jerrol Banana., MD       Future Appointments             In 6 days Jerrol Banana., MD [facility], [place]

## 2021-02-27 ENCOUNTER — Ambulatory Visit (INDEPENDENT_AMBULATORY_CARE_PROVIDER_SITE_OTHER): Payer: PPO | Admitting: Family Medicine

## 2021-02-27 ENCOUNTER — Encounter: Payer: Self-pay | Admitting: Family Medicine

## 2021-02-27 ENCOUNTER — Other Ambulatory Visit: Payer: Self-pay

## 2021-02-27 VITALS — BP 144/64 | HR 83 | Temp 98.6°F | Resp 16 | Wt 158.0 lb

## 2021-02-27 DIAGNOSIS — Z0000 Encounter for general adult medical examination without abnormal findings: Secondary | ICD-10-CM | POA: Diagnosis not present

## 2021-02-27 DIAGNOSIS — I10 Essential (primary) hypertension: Secondary | ICD-10-CM

## 2021-02-27 DIAGNOSIS — I480 Paroxysmal atrial fibrillation: Secondary | ICD-10-CM

## 2021-02-27 DIAGNOSIS — E7800 Pure hypercholesterolemia, unspecified: Secondary | ICD-10-CM | POA: Diagnosis not present

## 2021-02-27 DIAGNOSIS — E039 Hypothyroidism, unspecified: Secondary | ICD-10-CM

## 2021-02-27 NOTE — Progress Notes (Signed)
Complete physical exam   Patient: Mark RUBIN Sr.   DOB: August 18, 1933   88 y.o. Male  MRN: [id number] Visit Date: 02/27/2021  Today's healthcare provider: Megan Mans, MD   Chief Complaint  Patient presents with  . Annual Exam   Subjective    Mark PRABHAKAR Sr. is a 87 y.o. male who presents today for a complete physical exam.  He reports consuming a general diet. Home exercise routine includes walking. He generally feels well. He reports sleeping well. He does not have additional problems to discuss today.  HPI  Patient had AWV with [facility] on 10/31/2020. Tear ducts are stopped up and he is followed by ophthalmology for this.  He has been told he needs a blepharoplasty. Blood pressures at home running 1 10-1 30 over 50s to 60s. 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 Past Medical History:  Diagnosis Date  . Arthritis    left ankle  . Brain tumor (benign) (HCC)   . COPD (chronic obstructive pulmonary disease) (HCC)   . GERD (gastroesophageal reflux disease)   . Heart murmur   . Hyperlipidemia   . Hypertension   . Seasonal allergies    Past Surgical History:  Procedure Laterality Date  . EUS  03/27/2012   Procedure: UPPER ENDOSCOPIC ULTRASOUND (EUS) LINEAR;  Surgeon: Rachael Fee, MD;  Location: [facility];  Service: Endoscopy;  Laterality: N/A;  [place] EUS  . FINE NEEDLE ASPIRATION  03/27/2012   Procedure: FINE NEEDLE ASPIRATION (FNA) LINEAR;  Surgeon: Rachael Fee, MD;  Location: [facility];  Service: Endoscopy;  Laterality: N/A;  . HERNIA REPAIR    . ORIF ANKLE FRACTURE     Social History   Socioeconomic History  . Marital status: Married    Spouse name: Not on file  . Number of children: 1  . Years of education: Not on file  . Highest education level: High school graduate  Occupational History  . Occupation: part time  Tobacco Use  . Smoking status: Former Smoker    Types: Cigars    Quit date:  10/15/1999    Years since quitting: 21.3  . Smokeless tobacco: Never Used  . Tobacco comment: quit about 15 years ago  Vaping Use  . Vaping Use: Never used  Substance and Sexual Activity  . Alcohol use: Yes    Comment: occasionally, 1-2 drinks monthly  . Drug use: No  . Sexual activity: Not Currently  Other Topics Concern  . Not on file  Social History Narrative  . Not on file   Social Determinants of Health   Financial Resource Strain: Low Risk   . Difficulty of Paying Living Expenses: Not hard at all  Food Insecurity: No Food Insecurity  . Worried About Programme researcher, broadcasting/film/video in the Last Year: Never true  . Ran Out of Food in the Last Year: Never true  Transportation Needs: No Transportation Needs  . Lack of Transportation (Medical): No  . Lack of Transportation (Non-Medical): No  Physical Activity: Insufficiently Active  . Days of Exercise per Week: 2 days  . Minutes of Exercise per Session: 30 min  Stress: No Stress Concern Present  . Feeling of Stress : Not at all  Social Connections: Moderately Integrated  . Frequency of Communication with Friends and Family: More than three times a week  . Frequency of Social Gatherings with Friends and Family: More than three times a week  .  Attends Religious Services: More than 4 times per year  . Active Member of Clubs or Organizations: No  . Attends Archivist Meetings: Never  . Marital Status: Married  Human resources officer Violence: Not At Risk  . Fear of Current or Ex-Partner: No  . Emotionally Abused: No  . Physically Abused: No  . Sexually Abused: No   Family Status  Relation Name Status  . Mother  Deceased at age 96  . Father  Deceased at age 24       with pneumonia, both legs amputated  . Sister  Alive  . Sister  Deceased  . Son  Alive   Family History  Problem Relation Age of Onset  . Heart disease Mother   . Hypertension Mother   . Arthritis Mother   . Breast cancer Sister   . Breast cancer Sister     Allergies  Allergen Reactions  . Iodinated Diagnostic Agents Other (See Comments)  . Sulfa Antibiotics Itching and Rash    Allergic reaction was over 30years ago. Patient unsure of exact reaction    Patient Care Team: Jerrol Banana., MD as PCP - General (Family Medicine) Beacher May, MD as Referring Physician (Internal Medicine) Charletta Cousin, MD as Referring Physician (Ophthalmology) Corey Skains, MD as Consulting Physician (Cardiology) Chrismon, Driscilla Grammes (Family Medicine) Dasher, Rayvon Char, MD (Dermatology)   Medications: Outpatient Medications Prior to Visit  Medication Sig  . Multiple Vitamins-Minerals (PRESERVISION AREDS 2 PO) Take by mouth 2 (two) times daily.   Marland Kitchen neomycin-polymyxin b-dexamethasone (MAXITROL) 3.5-10000-0.1 SUSP Place 1 drop into both eyes as needed.   . pantoprazole (PROTONIX) 40 MG tablet Take 1 tablet (40 mg total) by mouth 2 (two) times daily.  . polyethylene glycol (MIRALAX / GLYCOLAX) 17 g packet Take 17 g by mouth daily as needed.   . pravastatin (PRAVACHOL) 20 MG tablet Take 1 tablet (20 mg total) by mouth nightly  . telmisartan (MICARDIS) 20 MG tablet Take 1 tablet (20 mg total) by mouth at bedtime.  Marland Kitchen terazosin (HYTRIN) 5 MG capsule Take 5 mg by mouth at bedtime.  . Wheat Dextrin (BENEFIBER PO) Take by mouth daily. When remembers  . amLODipine (NORVASC) 5 MG tablet Take 1 tablet (5 mg total) by mouth daily.  Marland Kitchen amoxicillin (AMOXIL) 500 MG tablet Take 500 mg by mouth as directed. 4 tablets prior to dental procedures.  Marland Kitchen aspirin 81 MG tablet Take 81 mg by mouth daily.  . Brinzolamide-Brimonidine (SIMBRINZA) 1-0.2 % SUSP Place 3 drops into both eyes daily. As needed  . carboxymethylcellulose (REFRESH PLUS) 0.5 % SOLN Place 1 drop into both eyes 2 (two) times daily as needed.   . cetirizine (ZYRTEC) 10 MG tablet Take 10 mg by mouth daily.  . fluticasone (FLONASE) 50 MCG/ACT nasal spray TAKE TWO PUFFS EACH NOSTRIL  DAILY.  Marland Kitchen latanoprost (XALATAN) 0.005 % ophthalmic solution Place 1 drop at bedtime into both eyes.  . metoprolol tartrate (LOPRESSOR) 25 MG tablet Take 0.5 tablets (12.5 mg total) by mouth 3 (three) times daily with meals. (Patient taking differently: Take 12.5 mg by mouth 2 (two) times daily.)  . mometasone (ASMANEX) 220 MCG/INH inhaler Inhale 1 puff into the lungs daily.   . Multiple Vitamin (MULTI-VITAMINS) TABS Take daily by mouth.  (Patient not taking: No sig reported)  . Netarsudil Dimesylate 0.02 % SOLN Apply 1 drop to eye at bedtime.  . timolol (BETIMOL) 0.25 % ophthalmic solution 1-2 drops 2 (two) times  daily. (Patient not taking: No sig reported)  . vitamin B-12 (CYANOCOBALAMIN) 500 MCG tablet Take 500 mcg by mouth daily. (Patient not taking: No sig reported)   No facility-administered medications prior to visit.    Review of Systems  All other systems reviewed and are negative.      Objective    BP (!) 144/64   Pulse 83   Temp 98.6 F (37 C)   Resp 16   Wt 158 lb (71.7 kg)   BMI 24.02 kg/m  BP Readings from Last 3 Encounters:  02/27/21 (!) 144/64  11/07/20 (!) 134/57  07/25/20 (!) 172/65   Wt Readings from Last 3 Encounters:  02/27/21 158 lb (71.7 kg)  11/07/20 159 lb (72.1 kg)  07/25/20 161 lb (73 kg)      Physical Exam Vitals reviewed.  Constitutional:      Appearance: Normal appearance.  HENT:     Head: Normocephalic and atraumatic.     Right Ear: External ear normal.     Left Ear: External ear normal.  Eyes:     General: No scleral icterus.    Conjunctiva/sclera: Conjunctivae normal.  Cardiovascular:     Rate and Rhythm: Normal rate and regular rhythm.     Heart sounds: Murmur heard.      Comments: Over 6 murmur heard at the apex and the right upper sternal border.  Radiates to the neck. Pulmonary:     Effort: Pulmonary effort is normal.     Breath sounds: Normal breath sounds.  Abdominal:     Palpations: Abdomen is soft. There is no mass.   Genitourinary:    Penis: Normal.      Testes: Normal.  Musculoskeletal:        General: Deformity present.     Comments: Chronic swelling from old traumatic left ankle fracture.  Skin:    General: Skin is warm and dry.  Neurological:     General: No focal deficit present.     Mental Status: He is alert and oriented to person, place, and time.  Psychiatric:        Mood and Affect: Mood normal.        Behavior: Behavior normal.        Thought Content: Thought content normal.        Judgment: Judgment normal.       Last depression screening scores PHQ 2/9 Scores 11/07/2020 10/31/2020 07/25/2020  PHQ - 2 Score 0 0 0  PHQ- 9 Score 0 - 0   Last fall risk screening Fall Risk  11/07/2020  Falls in the past year? 0  Comment -  Number falls in past yr: 0  Injury with Fall? 0  Follow up Falls evaluation completed   Last Audit-C alcohol use screening Alcohol Use Disorder Test (AUDIT) 11/07/2020  1. How often do you have a drink containing alcohol? 2  2. How many drinks containing alcohol do you have on a typical day when you are drinking? 0  3. How often do you have six or more drinks on one occasion? 0  AUDIT-C Score 2  Alcohol Brief Interventions/Follow-up AUDIT Score <7 follow-up not indicated   A score of 3 or more in women, and 4 or more in men indicates increased risk for alcohol abuse, EXCEPT if all of the points are from question 1   No results found for any visits on 02/27/21.  Assessment & Plan    Routine Health Maintenance and Physical Exam  Exercise Activities and  Dietary recommendations Goals    . Increase water intake     Recommend drinking 6-8 glasses of water daily.        Immunization History  Administered Date(s) Administered  . Fluad Quad(high Dose 65+) 07/25/2020  . Influenza, High Dose Seasonal PF 09/09/2016, 07/27/2019  . Influenza-Unspecified 08/11/2017, 07/21/2018  . PFIZER Comirnaty(Gray Top)Covid-19 Tri-Sucrose Vaccine 11/13/2019, 12/02/2019,  08/15/2020  . Pneumococcal Conjugate-13 08/16/2014  . Pneumococcal Polysaccharide-23 02/15/2015  . Td 01/24/2004, 04/16/2011, 05/25/2016  . Tdap 04/16/2011    Health Maintenance  Topic Date Due  . INFLUENZA VACCINE  05/15/2021  . TETANUS/TDAP  05/25/2026  . COVID-19 Vaccine  Completed  . PNA vac Low Risk Adult  Completed  . HPV VACCINES  Aged Out    Discussed health benefits of physical activity, and encouraged him to engage in regular exercise appropriate for his age and condition.  1. Annual physical exam   2. Essential (primary) hypertension Good blood pressure control at home. - CBC w/Diff/Platelet - Comprehensive Metabolic Panel (CMET)  3. Hypercholesterolemia Continue pravastatin - Lipid panel  4. Hypothyroidism, unspecified type  - TSH  5. Paroxysmal atrial fibrillation (HCC)    No follow-ups on file.     I, Wilhemena Durie, MD, have reviewed all documentation for this visit. The documentation on 03/03/21 for the exam, diagnosis, procedures, and orders are all accurate and complete.    Carry Ortez Cranford Mon, MD  [facility] [phone] (phone) [phone] (fax)  [place]

## 2021-03-03 DIAGNOSIS — E039 Hypothyroidism, unspecified: Secondary | ICD-10-CM | POA: Diagnosis not present

## 2021-03-03 DIAGNOSIS — I10 Essential (primary) hypertension: Secondary | ICD-10-CM | POA: Diagnosis not present

## 2021-03-03 DIAGNOSIS — E7800 Pure hypercholesterolemia, unspecified: Secondary | ICD-10-CM | POA: Diagnosis not present

## 2021-03-04 LAB — LIPID PANEL
Chol/HDL Ratio: 2.9 ratio (ref 0.0–5.0)
Cholesterol, Total: 145 mg/dL (ref 100–199)
HDL: 50 mg/dL (ref >39)
LDL Chol Calc (NIH): 85 mg/dL (ref 0–99)
Triglycerides: 44 mg/dL (ref 0–149)
VLDL Cholesterol Cal: 10 mg/dL (ref 5–40)

## 2021-03-04 LAB — COMPREHENSIVE METABOLIC PANEL
ALT: 9 IU/L (ref 0–44)
AST: 13 IU/L (ref 0–40)
Albumin/Globulin Ratio: 1.5 (ref 1.2–2.2)
Albumin: 3.7 g/dL (ref 3.6–4.6)
Alkaline Phosphatase: 75 IU/L (ref 44–121)
BUN/Creatinine Ratio: 25 — ABNORMAL HIGH (ref 10–24)
BUN: 23 mg/dL (ref 8–27)
Bilirubin Total: 0.5 mg/dL (ref 0.0–1.2)
CO2: 20 mmol/L (ref 20–29)
Calcium: 9 mg/dL (ref 8.6–10.2)
Chloride: 104 mmol/L (ref 96–106)
Creatinine, Ser: 0.93 mg/dL (ref 0.76–1.27)
Globulin, Total: 2.5 g/dL (ref 1.5–4.5)
Glucose: 97 mg/dL (ref 65–99)
Potassium: 4.3 mmol/L (ref 3.5–5.2)
Sodium: 139 mmol/L (ref 134–144)
Total Protein: 6.2 g/dL (ref 6.0–8.5)
eGFR: 80 mL/min/{1.73_m2} (ref >59)

## 2021-03-04 LAB — CBC WITH DIFFERENTIAL/PLATELET
Basophils Absolute: 0.1 10*3/uL (ref 0.0–0.2)
Basos: 1 %
EOS (ABSOLUTE): 1.2 10*3/uL — ABNORMAL HIGH (ref 0.0–0.4)
Eos: 14 %
Hematocrit: 39.6 % (ref 37.5–51.0)
Hemoglobin: 13.5 g/dL (ref 13.0–17.7)
Immature Grans (Abs): 0.1 10*3/uL (ref 0.0–0.1)
Immature Granulocytes: 1 %
Lymphocytes Absolute: 2.5 10*3/uL (ref 0.7–3.1)
Lymphs: 31 %
MCH: 31.3 pg (ref 26.6–33.0)
MCHC: 34.1 g/dL (ref 31.5–35.7)
MCV: 92 fL (ref 79–97)
Monocytes Absolute: 0.7 10*3/uL (ref 0.1–0.9)
Monocytes: 8 %
Neutrophils Absolute: 3.8 10*3/uL (ref 1.4–7.0)
Neutrophils: 45 %
Platelets: 180 10*3/uL (ref 150–450)
RBC: 4.32 x10E6/uL (ref 4.14–5.80)
RDW: 12.7 % (ref 11.6–15.4)
WBC: 8.3 10*3/uL (ref 3.4–10.8)

## 2021-03-04 LAB — TSH: TSH: 4.21 u[IU]/mL (ref 0.450–4.500)

## 2021-03-08 ENCOUNTER — Other Ambulatory Visit: Payer: Self-pay | Admitting: Family Medicine

## 2021-03-14 ENCOUNTER — Telehealth: Payer: Self-pay | Admitting: Family Medicine

## 2021-03-14 NOTE — Telephone Encounter (Signed)
Pt wife  Designee 1:  Name: Peyton Spengler  Phone Number: [phone]  Is calling for the pts lab work. Please advise CB- [phone]

## 2021-03-14 NOTE — Telephone Encounter (Addendum)
See result notes. Wife would like a printout of labs placed at front desk to pick up and a call to let her know they are ready,

## 2021-03-14 NOTE — Telephone Encounter (Signed)
Results printed, pt advised.

## 2021-05-02 DIAGNOSIS — I6523 Occlusion and stenosis of bilateral carotid arteries: Secondary | ICD-10-CM | POA: Insufficient documentation

## 2021-05-02 DIAGNOSIS — E782 Mixed hyperlipidemia: Secondary | ICD-10-CM | POA: Diagnosis not present

## 2021-05-02 DIAGNOSIS — I480 Paroxysmal atrial fibrillation: Secondary | ICD-10-CM | POA: Diagnosis not present

## 2021-05-02 DIAGNOSIS — R9431 Abnormal electrocardiogram [ECG] [EKG]: Secondary | ICD-10-CM | POA: Insufficient documentation

## 2021-05-02 DIAGNOSIS — I340 Nonrheumatic mitral (valve) insufficiency: Secondary | ICD-10-CM | POA: Diagnosis not present

## 2021-05-02 DIAGNOSIS — I10 Essential (primary) hypertension: Secondary | ICD-10-CM | POA: Diagnosis not present

## 2021-05-02 DIAGNOSIS — R0602 Shortness of breath: Secondary | ICD-10-CM | POA: Diagnosis not present

## 2021-05-02 DIAGNOSIS — I351 Nonrheumatic aortic (valve) insufficiency: Secondary | ICD-10-CM | POA: Diagnosis not present

## 2021-06-05 DIAGNOSIS — R0602 Shortness of breath: Secondary | ICD-10-CM | POA: Diagnosis not present

## 2021-06-05 DIAGNOSIS — R9431 Abnormal electrocardiogram [ECG] [EKG]: Secondary | ICD-10-CM | POA: Diagnosis not present

## 2021-06-05 DIAGNOSIS — I6523 Occlusion and stenosis of bilateral carotid arteries: Secondary | ICD-10-CM | POA: Diagnosis not present

## 2021-06-26 DIAGNOSIS — X32XXXA Exposure to sunlight, initial encounter: Secondary | ICD-10-CM | POA: Diagnosis not present

## 2021-06-26 DIAGNOSIS — I350 Nonrheumatic aortic (valve) stenosis: Secondary | ICD-10-CM | POA: Insufficient documentation

## 2021-06-26 DIAGNOSIS — L300 Nummular dermatitis: Secondary | ICD-10-CM | POA: Diagnosis not present

## 2021-06-26 DIAGNOSIS — D3614 Benign neoplasm of peripheral nerves and autonomic nervous system of thorax: Secondary | ICD-10-CM | POA: Diagnosis not present

## 2021-06-26 DIAGNOSIS — I6523 Occlusion and stenosis of bilateral carotid arteries: Secondary | ICD-10-CM | POA: Diagnosis not present

## 2021-06-26 DIAGNOSIS — Z08 Encounter for follow-up examination after completed treatment for malignant neoplasm: Secondary | ICD-10-CM | POA: Diagnosis not present

## 2021-06-26 DIAGNOSIS — L570 Actinic keratosis: Secondary | ICD-10-CM | POA: Diagnosis not present

## 2021-06-26 DIAGNOSIS — Z85828 Personal history of other malignant neoplasm of skin: Secondary | ICD-10-CM | POA: Diagnosis not present

## 2021-06-26 DIAGNOSIS — I119 Hypertensive heart disease without heart failure: Secondary | ICD-10-CM | POA: Diagnosis not present

## 2021-06-26 DIAGNOSIS — R0602 Shortness of breath: Secondary | ICD-10-CM | POA: Diagnosis not present

## 2021-06-26 DIAGNOSIS — I10 Essential (primary) hypertension: Secondary | ICD-10-CM | POA: Diagnosis not present

## 2021-08-25 NOTE — Progress Notes (Signed)
Established patient visit   Patient: Mark PASQUAL Sr.   DOB: [birth year]   87 y.o. Male  MRN: [id number] Visit Date: 08/28/2021  Today's healthcare provider: Wilhemena Durie, MD   No chief complaint on file.  Subjective    HPI  -Checked up on brain tumor last week and it has decreased in size.  Evidently has a benign meningioma which was smaller in size according to the reading of the last MRI. -Wants to check with [place] doctor about getting flu vaccine. He says he was advised by cardiology not to get the COVID-vaccine. Hypertension, follow-up  BP Readings from Last 3 Encounters:  02/27/21 (!) 144/64  11/07/20 (!) 134/57  07/25/20 (!) 172/65   Wt Readings from Last 3 Encounters:  02/27/21 158 lb (71.7 kg)  11/07/20 159 lb (72.1 kg)  07/25/20 161 lb (73 kg)     He was last seen for hypertension 6 months ago.  BP at that visit was 144/64. Management since that visit includes; Good blood pressure control at home. He reports good compliance with treatment. He is not having side effects.  He is exercising. He is adherent to low salt diet.   Outside blood pressures are 120-130/50's  He does not smoke.  Use of agents associated with hypertension: none.   ---------------------------------------------------------------------------------------------------     Medications: Outpatient Medications Prior to Visit  Medication Sig   amLODipine (NORVASC) 5 MG tablet Take 1 tablet (5 mg total) by mouth daily.   amoxicillin (AMOXIL) 500 MG tablet Take 500 mg by mouth as directed. 4 tablets prior to dental procedures.   aspirin 81 MG tablet Take 81 mg by mouth daily.   Brinzolamide-Brimonidine (SIMBRINZA) 1-0.2 % SUSP Place 3 drops into both eyes daily. As needed   carboxymethylcellulose (REFRESH PLUS) 0.5 % SOLN Place 1 drop into both eyes 2 (two) times daily as needed.    cetirizine (ZYRTEC) 10 MG tablet Take 10 mg by mouth daily.   fluticasone (FLONASE) 50 MCG/ACT  nasal spray TAKE TWO PUFFS EACH NOSTRIL DAILY.   latanoprost (XALATAN) 0.005 % ophthalmic solution Place 1 drop at bedtime into both eyes.   metoprolol tartrate (LOPRESSOR) 25 MG tablet Take 0.5 tablets (12.5 mg total) by mouth 3 (three) times daily with meals. (Patient taking differently: Take 12.5 mg by mouth 2 (two) times daily.)   mometasone (ASMANEX) 220 MCG/INH inhaler Inhale 1 puff into the lungs daily.    Multiple Vitamin (MULTI-VITAMINS) TABS Take daily by mouth.  (Patient not taking: No sig reported)   Multiple Vitamins-Minerals (PRESERVISION AREDS 2 PO) Take by mouth 2 (two) times daily.    neomycin-polymyxin b-dexamethasone (MAXITROL) 3.5-10000-0.1 SUSP Place 1 drop into both eyes as needed.    Netarsudil Dimesylate 0.02 % SOLN Apply 1 drop to eye at bedtime.   pantoprazole (PROTONIX) 40 MG tablet Take 1 tablet (40 mg total) by mouth 2 (two) times daily.   polyethylene glycol (MIRALAX / GLYCOLAX) 17 g packet Take 17 g by mouth daily as needed.    pravastatin (PRAVACHOL) 20 MG tablet Take 1 tablet (20 mg total) by mouth nightly   telmisartan (MICARDIS) 20 MG tablet Take 1 tablet (20 mg total) by mouth at bedtime.   terazosin (HYTRIN) 5 MG capsule Take 5 mg by mouth at bedtime.   timolol (BETIMOL) 0.25 % ophthalmic solution 1-2 drops 2 (two) times daily. (Patient not taking: No sig reported)   vitamin B-12 (CYANOCOBALAMIN) 500 MCG tablet Take 500 mcg by  mouth daily. (Patient not taking: No sig reported)   Wheat Dextrin (BENEFIBER PO) Take by mouth daily. When remembers   No facility-administered medications prior to visit.    Review of Systems  Constitutional:  Negative for appetite change, chills and fever.  Respiratory:  Negative for chest tightness, shortness of breath and wheezing.   Cardiovascular:  Negative for chest pain and palpitations.  Gastrointestinal:  Negative for abdominal pain, nausea and vomiting.       Objective    There were no vitals taken for this  visit. BP Readings from Last 3 Encounters:  08/28/21 (!) 140/53  02/27/21 (!) 144/64  11/07/20 (!) 134/57   Wt Readings from Last 3 Encounters:  08/28/21 152 lb (68.9 kg)  02/27/21 158 lb (71.7 kg)  11/07/20 159 lb (72.1 kg)      Physical Exam Vitals reviewed.  Constitutional:      Appearance: Normal appearance.  HENT:     Head: Normocephalic and atraumatic.     Right Ear: External ear normal.     Left Ear: External ear normal.  Eyes:     General: No scleral icterus.    Conjunctiva/sclera: Conjunctivae normal.  Cardiovascular:     Rate and Rhythm: Normal rate and regular rhythm.     Heart sounds: Normal heart sounds.  Pulmonary:     Effort: Pulmonary effort is normal.     Breath sounds: Normal breath sounds.  Abdominal:     Palpations: Abdomen is soft. There is no mass.  Skin:    General: Skin is warm and dry.  Neurological:     General: No focal deficit present.     Mental Status: He is alert and oriented to person, place, and time.  Psychiatric:        Mood and Affect: Mood normal.        Behavior: Behavior normal.        Thought Content: Thought content normal.        Judgment: Judgment normal.      No results found for any visits on 08/28/21.  Assessment & Plan     1. Essential (primary) hypertension Increase telmisartan from 20 to 40 mg daily. Follow-up in 3 to 4 months  2. Need for immunization against influenza  - Flu Vaccine QUAD High Dose(Fluad)  3. Nonrheumatic aortic valve stenosis   4. Gastroesophageal reflux disease without esophagitis On pantoprazole twice a day  5. Benign brain tumor ([place]) Meningioma followed at the [place].  6. Mild cognitive disorder MMSE on next visit  7. Mixed hyperlipidemia On pravastatin  8. Primary open angle glaucoma (POAG) of both eyes, indeterminate stage Followed by ophthalmology   No follow-ups on file.      I, Wilhemena Durie, MD, have reviewed all documentation for this visit. The documentation  on 08/28/21 for the exam, diagnosis, procedures, and orders are all accurate and complete.    Rainee Sweatt Cranford Mon, MD  [facility] [phone] (phone) [phone] (fax)  [place]

## 2021-08-28 ENCOUNTER — Other Ambulatory Visit: Payer: Self-pay

## 2021-08-28 ENCOUNTER — Encounter: Payer: Self-pay | Admitting: Family Medicine

## 2021-08-28 ENCOUNTER — Ambulatory Visit (INDEPENDENT_AMBULATORY_CARE_PROVIDER_SITE_OTHER): Payer: PPO | Admitting: Family Medicine

## 2021-08-28 VITALS — BP 140/53 | HR 62 | Temp 97.7°F | Ht 69.0 in | Wt 152.0 lb

## 2021-08-28 DIAGNOSIS — D333 Benign neoplasm of cranial nerves: Secondary | ICD-10-CM

## 2021-08-28 DIAGNOSIS — K219 Gastro-esophageal reflux disease without esophagitis: Secondary | ICD-10-CM | POA: Diagnosis not present

## 2021-08-28 DIAGNOSIS — E782 Mixed hyperlipidemia: Secondary | ICD-10-CM | POA: Diagnosis not present

## 2021-08-28 DIAGNOSIS — H401134 Primary open-angle glaucoma, bilateral, indeterminate stage: Secondary | ICD-10-CM

## 2021-08-28 DIAGNOSIS — I10 Essential (primary) hypertension: Secondary | ICD-10-CM | POA: Diagnosis not present

## 2021-08-28 DIAGNOSIS — I350 Nonrheumatic aortic (valve) stenosis: Secondary | ICD-10-CM | POA: Diagnosis not present

## 2021-08-28 DIAGNOSIS — Z23 Encounter for immunization: Secondary | ICD-10-CM

## 2021-08-28 DIAGNOSIS — F09 Unspecified mental disorder due to known physiological condition: Secondary | ICD-10-CM | POA: Diagnosis not present

## 2021-08-28 MED ORDER — TELMISARTAN 40 MG PO TABS: 40.0000 mg | ORAL_TABLET | Freq: Every day | ORAL | 1 refills | Status: DC

## 2021-09-08 ENCOUNTER — Other Ambulatory Visit: Payer: Self-pay | Admitting: Family Medicine

## 2021-09-08 DIAGNOSIS — K219 Gastro-esophageal reflux disease without esophagitis: Secondary | ICD-10-CM

## 2021-10-10 ENCOUNTER — Other Ambulatory Visit: Payer: Self-pay | Admitting: Family Medicine

## 2021-11-01 ENCOUNTER — Telehealth: Payer: Self-pay

## 2021-11-01 NOTE — Telephone Encounter (Signed)
Copied from [place] [phone]. Topic: Appointment Scheduling - Scheduling Inquiry for Clinic >> Nov 01, 2021  1:32 PM Pawlus, Brayton Layman A wrote: Reason for CRM: Pt has a Medicare AWV appt on 1/23 and needs to reschedule, please advise.

## 2021-11-13 ENCOUNTER — Ambulatory Visit (INDEPENDENT_AMBULATORY_CARE_PROVIDER_SITE_OTHER): Payer: PPO

## 2021-11-13 DIAGNOSIS — I872 Venous insufficiency (chronic) (peripheral): Secondary | ICD-10-CM | POA: Insufficient documentation

## 2021-11-13 DIAGNOSIS — H524 Presbyopia: Secondary | ICD-10-CM | POA: Insufficient documentation

## 2021-11-13 DIAGNOSIS — H04203 Unspecified epiphora, bilateral lacrimal glands: Secondary | ICD-10-CM | POA: Insufficient documentation

## 2021-11-13 DIAGNOSIS — Z0000 Encounter for general adult medical examination without abnormal findings: Secondary | ICD-10-CM | POA: Diagnosis not present

## 2021-11-13 DIAGNOSIS — Z461 Encounter for fitting and adjustment of hearing aid: Secondary | ICD-10-CM | POA: Insufficient documentation

## 2021-11-13 DIAGNOSIS — M25572 Pain in left ankle and joints of left foot: Secondary | ICD-10-CM | POA: Insufficient documentation

## 2021-11-13 DIAGNOSIS — N135 Crossing vessel and stricture of ureter without hydronephrosis: Secondary | ICD-10-CM | POA: Insufficient documentation

## 2021-11-13 DIAGNOSIS — H02115 Cicatricial ectropion of left lower eyelid: Secondary | ICD-10-CM | POA: Insufficient documentation

## 2021-11-13 DIAGNOSIS — H401113 Primary open-angle glaucoma, right eye, severe stage: Secondary | ICD-10-CM | POA: Insufficient documentation

## 2021-11-13 DIAGNOSIS — S82899A Other fracture of unspecified lower leg, initial encounter for closed fracture: Secondary | ICD-10-CM | POA: Insufficient documentation

## 2021-11-13 DIAGNOSIS — J449 Chronic obstructive pulmonary disease, unspecified: Secondary | ICD-10-CM | POA: Insufficient documentation

## 2021-11-13 DIAGNOSIS — I351 Nonrheumatic aortic (valve) insufficiency: Secondary | ICD-10-CM | POA: Insufficient documentation

## 2021-11-13 DIAGNOSIS — I350 Nonrheumatic aortic (valve) stenosis: Secondary | ICD-10-CM | POA: Insufficient documentation

## 2021-11-13 DIAGNOSIS — D331 Benign neoplasm of brain, infratentorial: Secondary | ICD-10-CM | POA: Insufficient documentation

## 2021-11-13 DIAGNOSIS — Z659 Problem related to unspecified psychosocial circumstances: Secondary | ICD-10-CM | POA: Insufficient documentation

## 2021-11-13 DIAGNOSIS — Z23 Encounter for immunization: Secondary | ICD-10-CM | POA: Insufficient documentation

## 2021-11-13 DIAGNOSIS — H903 Sensorineural hearing loss, bilateral: Secondary | ICD-10-CM | POA: Insufficient documentation

## 2021-11-13 NOTE — Patient Instructions (Signed)
Mark Sanchez , Thank you for taking time to come for your Medicare Wellness Visit. I appreciate your ongoing commitment to your health goals. Please review the following plan we discussed and let me know if I can assist you in the future.   Screening recommendations/referrals: Colonoscopy: aged out Recommended yearly ophthalmology/optometry visit for glaucoma screening and checkup Recommended yearly dental visit for hygiene and checkup  Vaccinations: Influenza vaccine: 08/28/21 Pneumococcal vaccine: 02/15/15 Tdap vaccine: 05/25/16 Shingles vaccine: n/d   Covid-19: 11/13/19, 12/02/19, 08/15/20  Advanced directives: yes  Conditions/risks identified: no  Next appointment: Follow up in one year for your annual wellness visit.   Preventive Care 31 Years and Older, Male Preventive care refers to lifestyle choices and visits with your health care provider that can promote health and wellness. What does preventive care include? A yearly physical exam. This is also called an annual well check. Dental exams once or twice a year. Routine eye exams. Ask your health care provider how often you should have your eyes checked. Personal lifestyle choices, including: Daily care of your teeth and gums. Regular physical activity. Eating a healthy diet. Avoiding tobacco and drug use. Limiting alcohol use. Practicing safe sex. Taking low doses of aspirin every day. Taking vitamin and mineral supplements as recommended by your health care provider. What happens during an annual well check? The services and screenings done by your health care provider during your annual well check will depend on your age, overall health, lifestyle risk factors, and family history of disease. Counseling  Your health care provider may ask you questions about your: Alcohol use. Tobacco use. Drug use. Emotional well-being. Home and relationship well-being. Sexual activity. Eating habits. History of falls. Memory and  ability to understand (cognition). Work and work Statistician. Screening  You may have the following tests or measurements: Height, weight, and BMI. Blood pressure. Lipid and cholesterol levels. These may be checked every 5 years, or more frequently if you are over 7 years old. Skin check. Lung cancer screening. You may have this screening every year starting at age 90 if you have a 30-pack-year history of smoking and currently smoke or have quit within the past 15 years. Fecal occult blood test (FOBT) of the stool. You may have this test every year starting at age 68. Flexible sigmoidoscopy or colonoscopy. You may have a sigmoidoscopy every 5 years or a colonoscopy every 10 years starting at age 70. Prostate cancer screening. Recommendations will vary depending on your family history and other risks. Hepatitis C blood test. Hepatitis B blood test. Sexually transmitted disease (STD) testing. Diabetes screening. This is done by checking your blood sugar (glucose) after you have not eaten for a while (fasting). You may have this done every 1-3 years. Abdominal aortic aneurysm (AAA) screening. You may need this if you are a current or former smoker. Osteoporosis. You may be screened starting at age 71 if you are at high risk. Talk with your health care provider about your test results, treatment options, and if necessary, the need for more tests. Vaccines  Your health care provider may recommend certain vaccines, such as: Influenza vaccine. This is recommended every year. Tetanus, diphtheria, and acellular pertussis (Tdap, Td) vaccine. You may need a Td booster every 10 years. Zoster vaccine. You may need this after age 44. Pneumococcal 13-valent conjugate (PCV13) vaccine. One dose is recommended after age 35. Pneumococcal polysaccharide (PPSV23) vaccine. One dose is recommended after age 98. Talk to your health care provider about which screenings  and vaccines you need and how often you need  them. This information is not intended to replace advice given to you by your health care provider. Make sure you discuss any questions you have with your health care provider. Document Released: 10/28/2015 Document Revised: 06/20/2016 Document Reviewed: 08/02/2015 Elsevier Interactive Patient Education  2017 [employer] Prevention in the Home Falls can cause injuries. They can happen to people of all ages. There are many things you can do to make your home safe and to help prevent falls. What can I do on the outside of my home? Regularly fix the edges of walkways and driveways and fix any cracks. Remove anything that might make you trip as you walk through a door, such as a raised step or threshold. Trim any bushes or trees on the path to your home. Use bright outdoor lighting. Clear any walking paths of anything that might make someone trip, such as rocks or tools. Regularly check to see if handrails are loose or broken. Make sure that both sides of any steps have handrails. Any raised decks and porches should have guardrails on the edges. Have any leaves, snow, or ice cleared regularly. Use sand or salt on walking paths during winter. Clean up any spills in your garage right away. This includes oil or grease spills. What can I do in the bathroom? Use night lights. Install grab bars by the toilet and in the tub and shower. Do not use towel bars as grab bars. Use non-skid mats or decals in the tub or shower. If you need to sit down in the shower, use a plastic, non-slip stool. Keep the floor dry. Clean up any water that spills on the floor as soon as it happens. Remove soap buildup in the tub or shower regularly. Attach bath mats securely with double-sided non-slip rug tape. Do not have throw rugs and other things on the floor that can make you trip. What can I do in the bedroom? Use night lights. Make sure that you have a light by your bed that is easy to reach. Do not use  any sheets or blankets that are too big for your bed. They should not hang down onto the floor. Have a firm chair that has side arms. You can use this for support while you get dressed. Do not have throw rugs and other things on the floor that can make you trip. What can I do in the kitchen? Clean up any spills right away. Avoid walking on wet floors. Keep items that you use a lot in easy-to-reach places. If you need to reach something above you, use a strong step stool that has a grab bar. Keep electrical cords out of the way. Do not use floor polish or wax that makes floors slippery. If you must use wax, use non-skid floor wax. Do not have throw rugs and other things on the floor that can make you trip. What can I do with my stairs? Do not leave any items on the stairs. Make sure that there are handrails on both sides of the stairs and use them. Fix handrails that are broken or loose. Make sure that handrails are as long as the stairways. Check any carpeting to make sure that it is firmly attached to the stairs. Fix any carpet that is loose or worn. Avoid having throw rugs at the top or bottom of the stairs. If you do have throw rugs, attach them to the floor with carpet tape.  Make sure that you have a light switch at the top of the stairs and the bottom of the stairs. If you do not have them, ask someone to add them for you. What else can I do to help prevent falls? Wear shoes that: Do not have high heels. Have rubber bottoms. Are comfortable and fit you well. Are closed at the toe. Do not wear sandals. If you use a stepladder: Make sure that it is fully opened. Do not climb a closed stepladder. Make sure that both sides of the stepladder are locked into place. Ask someone to hold it for you, if possible. Clearly mark and make sure that you can see: Any grab bars or handrails. First and last steps. Where the edge of each step is. Use tools that help you move around (mobility aids)  if they are needed. These include: Canes. Walkers. Scooters. Crutches. Turn on the lights when you go into a dark area. Replace any light bulbs as soon as they burn out. Set up your furniture so you have a clear path. Avoid moving your furniture around. If any of your floors are uneven, fix them. If there are any pets around you, be aware of where they are. Review your medicines with your doctor. Some medicines can make you feel dizzy. This can increase your chance of falling. Ask your doctor what other things that you can do to help prevent falls. This information is not intended to replace advice given to you by your health care provider. Make sure you discuss any questions you have with your health care provider. Document Released: 07/28/2009 Document Revised: 03/08/2016 Document Reviewed: 11/05/2014 Elsevier Interactive Patient Education  2017 [employer].

## 2021-11-13 NOTE — Progress Notes (Signed)
Virtual Visit via Telephone Note  I connected with  Mark Ends Sr. on 11/13/21 at  3:40 PM EST by telephone and verified that I am speaking with the correct person using two identifiers.  Location: Patient: home Provider: [facility] Persons participating in the virtual visit: [place]   I discussed the limitations, risks, security and privacy concerns of performing an evaluation and management service by telephone and the availability of in person appointments. The patient expressed understanding and agreed to proceed.  Interactive audio and video telecommunications were attempted between this nurse and patient, however failed, due to patient having technical difficulties OR patient did not have access to video capability.  We continued and completed visit with audio only.  Some vital signs may be absent or patient reported.   Dionisio David, LPN  Subjective:   Mark MURGIA Sr. is a 88 y.o. male who presents for Medicare Annual/Subsequent preventive examination.  Review of Systems           Objective:    There were no vitals filed for this visit. There is no height or weight on file to calculate BMI.  Advanced Directives 10/31/2020 09/21/2019 09/05/2018 08/23/2017 07/12/2016 02/13/2016 03/27/2012  Does Patient Have a Medical Advance Directive? Yes Yes Yes Yes No Yes Patient has advance directive, copy not in chart  Type of Advance Directive [place];Living will [place];Living will [place];Living will Living will - - [place];Living will  Copy of [place] in Chart? No - copy requested No - copy requested No - copy requested - - - -    Current Medications (verified) Outpatient Encounter Medications as of 11/13/2021  Medication Sig   amLODipine (NORVASC) 10 MG tablet Take 0.5 tablets by mouth daily.   aspirin 81 MG EC tablet Take 1 tablet by mouth daily.    atropine 1 % ophthalmic solution INSTILL 1 DROP IN RIGHT EYE TWO TIMES A DAY SEND TO 4B AMB SURGERY OR 10/26/21   Carboxymethylcellulose Sodium 1 % GEL APPLY 1 DROP IN EACH EYE FOUR TIMES A DAY FOR DRY EYE(S)   ciprofloxacin (CILOXAN) 0.3 % ophthalmic solution INSTILL 1 DROP IN RIGHT EYE FOUR TIMES A DAY (ANTIBIOTIC EYE DROPS) SEND TO 4B AMB SURGERY 1/12 ON DAY OF SURGERY   Fluticasone-Sodium Chloride 50-2.7 MCG/ACT-% THPK INSTILL  INTO NOSE   Multiple Vitamins-Minerals (PRESERVISION AREDS 2) CAPS TAKE  BY MOUTH TWO TIMES A DAY   pantoprazole (PROTONIX) 40 MG tablet Take 1 tablet by mouth 2 (two) times daily.   pravastatin (PRAVACHOL) 20 MG tablet Take by mouth.   pravastatin (PRAVACHOL) 40 MG tablet Take 0.5 tablets by mouth at bedtime.   prednisoLONE acetate (PRED FORTE) 1 % ophthalmic suspension INSTILL 1 DROP IN RIGHT EYE AS DIRECTED SEND TO 4B AMB SURGERY OR ON DAY OF SURGERY   telmisartan (MICARDIS) 20 MG tablet Take 1 tablet by mouth daily.   amLODipine (NORVASC) 5 MG tablet Take 1 tablet (5 mg total) by mouth daily.   amoxicillin (AMOXIL) 500 MG tablet Take 500 mg by mouth as directed. 4 tablets prior to dental procedures.   aspirin 81 MG tablet Take 81 mg by mouth daily.   Brinzolamide-Brimonidine (SIMBRINZA) 1-0.2 % SUSP Place 3 drops into both eyes daily. As needed   carboxymethylcellulose (REFRESH PLUS) 0.5 % SOLN Place 1 drop into both eyes 2 (two) times daily as needed.    carboxymethylcellulose (REFRESH PLUS) 0.5 %  SOLN Apply to eye.   cetirizine (ZYRTEC) 10 MG tablet Take 10 mg by mouth daily.   Cetirizine HCl 10 MG CAPS Take by mouth.   fluticasone (FLONASE) 50 MCG/ACT nasal spray TAKE TWO PUFFS EACH NOSTRIL DAILY.   latanoprost (XALATAN) 0.005 % ophthalmic solution Place 1 drop at bedtime into both eyes.   metoprolol tartrate (LOPRESSOR) 25 MG tablet Take 0.5 tablets (12.5 mg total) by mouth 3 (three) times daily with meals. (Patient taking differently: Take 12.5 mg by mouth 2  (two) times daily.)   metoprolol tartrate (LOPRESSOR) 25 MG tablet Take by mouth.   mometasone (ASMANEX) 220 MCG/INH inhaler Inhale 1 puff into the lungs daily.    Multiple Vitamin (MULTI-VITAMINS) TABS Take by mouth daily.   neomycin-polymyxin b-dexamethasone (MAXITROL) 3.5-10000-0.1 SUSP Place 1 drop into both eyes as needed.    Netarsudil Dimesylate 0.02 % SOLN Apply 1 drop to eye at bedtime.   pantoprazole (PROTONIX) 40 MG tablet Take 1 tablet (40 mg total) by mouth 2 (two) times daily.   polyethylene glycol (MIRALAX / GLYCOLAX) 17 g packet Take 17 g by mouth daily as needed.    pravastatin (PRAVACHOL) 20 MG tablet Take 1 tablet (20 mg total) by mouth nightly   telmisartan (MICARDIS) 40 MG tablet Take 1 tablet (40 mg total) by mouth daily.   terazosin (HYTRIN) 5 MG capsule Take 5 mg by mouth at bedtime.   timolol (BETIMOL) 0.25 % ophthalmic solution 1-2 drops 2 (two) times daily.   triamcinolone ointment (KENALOG) 0.1 % SMARTSIG:1 Topical Daily PRN   vitamin B-12 (CYANOCOBALAMIN) 500 MCG tablet Take 500 mcg by mouth daily.   Wheat Dextrin (BENEFIBER DRINK MIX PO) Take by mouth.   Wheat Dextrin (BENEFIBER PO) Take by mouth daily. When remembers   [DISCONTINUED] Multiple Vitamins-Minerals (PRESERVISION AREDS 2 PO) Take by mouth 2 (two) times daily.    [DISCONTINUED] Multiple Vitamins-Minerals (PRESERVISION AREDS 2+MULTI VIT) CAPS Take by mouth.   No facility-administered encounter medications on file as of 11/13/2021.    Allergies (verified) Iodinated contrast media, Sulfamethoxazole-trimethoprim, and Sulfa antibiotics   History: Past Medical History:  Diagnosis Date   Arthritis    left ankle   Brain tumor (benign) (HCC)    COPD (chronic obstructive pulmonary disease) (HCC)    GERD (gastroesophageal reflux disease)    Heart murmur    Hyperlipidemia    Hypertension    Seasonal allergies    Past Surgical History:  Procedure Laterality Date   EUS  03/27/2012   Procedure: UPPER  ENDOSCOPIC ULTRASOUND (EUS) LINEAR;  Surgeon: Milus Banister, MD;  Location: [facility];  Service: Endoscopy;  Laterality: N/A;  [place] EUS   FINE NEEDLE ASPIRATION  03/27/2012   Procedure: FINE NEEDLE ASPIRATION (FNA) LINEAR;  Surgeon: Milus Banister, MD;  Location: [facility];  Service: Endoscopy;  Laterality: N/A;   HERNIA REPAIR     ORIF ANKLE FRACTURE     Family History  Problem Relation Age of Onset   Heart disease Mother    Hypertension Mother    Arthritis Mother    Breast cancer Sister    Breast cancer Sister    Social History   Socioeconomic History   Marital status: Married    Spouse name: Not on file   Number of children: 1   Years of education: Not on file   Highest education level: High school graduate  Occupational History   Occupation: part time  Tobacco Use   Smoking status: Former  Types: Cigars    Quit date: 10/15/1999    Years since quitting: 22.0   Smokeless tobacco: Never   Tobacco comments:    quit about 15 years ago  Vaping Use   Vaping Use: Never used  Substance and Sexual Activity   Alcohol use: Yes    Comment: occasionally, 1-2 drinks monthly   Drug use: No   Sexual activity: Not Currently  Other Topics Concern   Not on file  Social History Narrative   Not on file   Social Determinants of Health   Financial Resource Strain: Not on file  Food Insecurity: Not on file  Transportation Needs: Not on file  Physical Activity: Not on file  Stress: Not on file  Social Connections: Not on file    Tobacco Counseling Counseling given: Not Answered Tobacco comments: quit about 15 years ago   Clinical Intake:  Pre-visit preparation completed: Yes  Pain : No/denies pain     Nutritional Risks: None Diabetes: No  How often do you need to have someone help you when you read instructions, pamphlets, or other written materials from your doctor or pharmacy?: 1 - Never  Diabetic?no  Interpreter Needed?: No  Information entered by  :: Kirke Shaggy, LPN   Activities of Daily Living No flowsheet data found.  Patient Care Team: Jerrol Banana., MD as PCP - General (Family Medicine) Beacher May, MD as Referring Physician (Internal Medicine) Charletta Cousin, MD as Referring Physician (Ophthalmology) Corey Skains, MD as Consulting Physician (Cardiology) Chrismon, Vickki Muff, PA-C (Inactive) (Family Medicine) Dasher, Rayvon Char, MD (Dermatology)  Indicate any recent Medical Services you may have received from other than [facility] providers in the past year (date may be approximate).     Assessment:   This is a routine wellness examination for [place].  Hearing/Vision screen No results found.  Dietary issues and exercise activities discussed:     Goals Addressed   None    Depression Screen PHQ 2/9 Scores 11/07/2020 10/31/2020 07/25/2020 09/21/2019 09/05/2018 09/05/2018 09/05/2018  PHQ - 2 Score 0 0 0 0 0 0 0  PHQ- 9 Score 0 - 0 - - 0 -    Fall Risk Fall Risk  11/07/2020 10/31/2020 07/25/2020 09/21/2019 09/09/2019  Falls in the past year? 0 0 0 0 0  Comment - - - - [place]i Telephone Survey: data to providers prior to load  Number falls in past yr: 0 0 0 0 -  Injury with Fall? 0 0 0 0 -  Follow up Falls evaluation completed - Falls evaluation completed - -    FALL RISK PREVENTION PERTAINING TO THE HOME:  Any stairs in or around the home? Yes  If so, are there any without handrails? No  Home free of loose throw rugs in walkways, pet beds, electrical cords, etc? Yes  Adequate lighting in your home to reduce risk of falls? Yes   ASSISTIVE DEVICES UTILIZED TO PREVENT FALLS:  Life alert? No  Use of a cane, walker or w/c? No  Grab bars in the bathroom? Yes  Shower chair or bench in shower? No  Elevated toilet seat or a handicapped toilet? No     Cognitive Function:Normal cognitive status assessed by direct observation by this Nurse Health Advisor. No abnormalities found.       6CIT  Screen 09/21/2019 08/27/2016  What Year? 0 points 0 points  What month? 0 points 0 points  What time? 0 points 0 points  Count back from  20 0 points 0 points  Months in reverse 0 points 0 points  Repeat phrase 0 points 6 points  Total Score 0 6    Immunizations Immunization History  Administered Date(s) Administered   Fluad Quad(high Dose 65+) 07/25/2020, 08/28/2021   Influenza, High Dose Seasonal PF 09/09/2016, 07/27/2019   Influenza-Unspecified 08/11/2017, 07/21/2018   PFIZER Comirnaty(Gray Top)Covid-19 Tri-Sucrose Vaccine 11/13/2019, 12/02/2019, 08/15/2020   Pneumococcal Conjugate-13 10/30/2013, 08/16/2014   Pneumococcal Polysaccharide-23 11/15/2003, 02/15/2015   Td 01/24/2004, 04/16/2011, 05/25/2016   Tdap 04/16/2011    TDAP status: Up to date  Flu Vaccine status: Up to date  Pneumococcal vaccine status: Up to date  Covid-19 vaccine status: Completed vaccines  Qualifies for Shingles Vaccine? Yes   Zostavax completed No   Shingrix Completed?: No.    Education has been provided regarding the importance of this vaccine. Patient has been advised to call insurance company to determine out of pocket expense if they have not yet received this vaccine. Advised may also receive vaccine at local pharmacy or Health Dept. Verbalized acceptance and understanding.  Screening Tests Health Maintenance  Topic Date Due   Zoster Vaccines- Shingrix (1 of 2) Never done   COVID-19 Vaccine (4 - Booster for Pfizer series) 10/10/2020   TETANUS/TDAP  05/25/2026   Pneumonia Vaccine 50+ Years old  Completed   INFLUENZA VACCINE  Completed   HPV VACCINES  Aged Out    Health Maintenance  Health Maintenance Due  Topic Date Due   Zoster Vaccines- Shingrix (1 of 2) Never done   COVID-19 Vaccine (4 - Booster for Pfizer series) 10/10/2020    Colorectal cancer screening: No longer required.   Lung Cancer Screening: (Low Dose CT Chest recommended if Age 25-80 years, 30 pack-year currently  smoking OR have quit w/in 15years.) does not qualify.    Additional Screening:  Hepatitis C Screening: does not qualify; Completed NO  Vision Screening: Recommended annual ophthalmology exams for early detection of glaucoma and other disorders of the eye. Is the patient up to date with their annual eye exam?  Yes  Who is the provider or what is the name of the office in which the patient attends annual eye exams? [facility] If pt is not established with a provider, would they like to be referred to a provider to establish care? No .   Dental Screening: Recommended annual dental exams for proper oral hygiene  Community Resource Referral / Chronic Care Management: CRR required this visit?  No   CCM required this visit?  No      Plan:     I have personally reviewed and noted the following in the patients chart:   Medical and social history Use of alcohol, tobacco or illicit drugs  Current medications and supplements including opioid prescriptions. Patient is not currently taking opioid prescriptions. Functional ability and status Nutritional status Physical activity Advanced directives List of other physicians Hospitalizations, surgeries, and ER visits in previous 12 months Vitals Screenings to include cognitive, depression, and falls Referrals and appointments  In addition, I have reviewed and discussed with patient certain preventive protocols, quality metrics, and best practice recommendations. A written personalized care plan for preventive services as well as general preventive health recommendations were provided to patient.     Dionisio David, LPN   [id number]   Nurse Notes: none

## 2021-12-11 DIAGNOSIS — D485 Neoplasm of uncertain behavior of skin: Secondary | ICD-10-CM | POA: Diagnosis not present

## 2021-12-11 DIAGNOSIS — X32XXXA Exposure to sunlight, initial encounter: Secondary | ICD-10-CM | POA: Diagnosis not present

## 2021-12-11 DIAGNOSIS — D0439 Carcinoma in situ of skin of other parts of face: Secondary | ICD-10-CM | POA: Diagnosis not present

## 2021-12-11 DIAGNOSIS — L570 Actinic keratosis: Secondary | ICD-10-CM | POA: Diagnosis not present

## 2022-01-04 ENCOUNTER — Other Ambulatory Visit: Payer: Self-pay | Admitting: Family Medicine

## 2022-01-06 NOTE — Telephone Encounter (Signed)
equested medications are due for refill today.  unsure ? ?Requested medications are on the active medications list.  yes ? ?Last refill. 10/10/2021 #90 0 refills ? ?Future visit scheduled.   yes ? ?Notes to clinic.  Per office note from 11/13/2021 pt is not taking this medication ? ?Requested Prescriptions  ?Pending Prescriptions Disp Refills  ? amLODipine (NORVASC) 5 MG tablet [Pharmacy Med Name: AMLODIPINE BESYLATE 5 MG TAB] 90 tablet 0  ?  Sig: Take 1 tablet (5 mg total) by mouth daily.  ?  ? Cardiovascular: Calcium Channel Blockers 2 Failed - 01/04/2022  5:37 PM  ?  ?  Failed - Last BP in normal range  ?  BP Readings from Last 1 Encounters:  ?08/28/21 (!) 140/53  ?  ?  ?  ?  Passed - Last Heart Rate in normal range  ?  Pulse Readings from Last 1 Encounters:  ?08/28/21 62  ?  ?  ?  ?  Passed - Valid encounter within last 6 months  ?  Recent Outpatient Visits   ? ?      ? 4 months ago Essential (primary) hypertension  ? [facility] Jerrol Banana., MD  ? 10 months ago Annual physical exam  ? [facility] Jerrol Banana., MD  ? 1 year ago Essential (primary) hypertension  ? [facility] Jerrol Banana., MD  ? 1 year ago Essential (primary) hypertension  ? [facility] Jerrol Banana., MD  ? 1 year ago Essential (primary) hypertension  ? [facility] Jerrol Banana., MD  ? ?  ?  ?Future Appointments   ? ?        ? In 1 month Jerrol Banana., MD [facility]  ? ?  ? ?  ?  ?  ?  ?

## 2022-02-19 DIAGNOSIS — D0439 Carcinoma in situ of skin of other parts of face: Secondary | ICD-10-CM | POA: Diagnosis not present

## 2022-02-26 ENCOUNTER — Ambulatory Visit: Payer: PPO | Admitting: Family Medicine

## 2022-03-05 ENCOUNTER — Ambulatory Visit (INDEPENDENT_AMBULATORY_CARE_PROVIDER_SITE_OTHER): Payer: PPO | Admitting: Family Medicine

## 2022-03-05 ENCOUNTER — Encounter: Payer: Self-pay | Admitting: Family Medicine

## 2022-03-05 VITALS — BP 146/56 | HR 62 | Resp 16 | Wt 154.0 lb

## 2022-03-05 DIAGNOSIS — E785 Hyperlipidemia, unspecified: Secondary | ICD-10-CM

## 2022-03-05 DIAGNOSIS — K2100 Gastro-esophageal reflux disease with esophagitis, without bleeding: Secondary | ICD-10-CM | POA: Diagnosis not present

## 2022-03-05 DIAGNOSIS — N189 Chronic kidney disease, unspecified: Secondary | ICD-10-CM

## 2022-03-05 DIAGNOSIS — E1169 Type 2 diabetes mellitus with other specified complication: Secondary | ICD-10-CM | POA: Diagnosis not present

## 2022-03-05 DIAGNOSIS — E782 Mixed hyperlipidemia: Secondary | ICD-10-CM

## 2022-03-05 DIAGNOSIS — Z87448 Personal history of other diseases of urinary system: Secondary | ICD-10-CM | POA: Diagnosis not present

## 2022-03-05 DIAGNOSIS — I872 Venous insufficiency (chronic) (peripheral): Secondary | ICD-10-CM

## 2022-03-05 DIAGNOSIS — F09 Unspecified mental disorder due to known physiological condition: Secondary | ICD-10-CM

## 2022-03-05 DIAGNOSIS — I10 Essential (primary) hypertension: Secondary | ICD-10-CM | POA: Diagnosis not present

## 2022-03-05 DIAGNOSIS — I351 Nonrheumatic aortic (valve) insufficiency: Secondary | ICD-10-CM | POA: Diagnosis not present

## 2022-03-05 DIAGNOSIS — H401113 Primary open-angle glaucoma, right eye, severe stage: Secondary | ICD-10-CM

## 2022-03-05 DIAGNOSIS — I480 Paroxysmal atrial fibrillation: Secondary | ICD-10-CM | POA: Diagnosis not present

## 2022-03-05 MED ORDER — TELMISARTAN 40 MG PO TABS: 40.0000 mg | ORAL_TABLET | Freq: Every day | ORAL | 3 refills | Status: AC

## 2022-03-05 NOTE — Progress Notes (Signed)
Established patient visit  I,April Miller,acting as a scribe for Wilhemena Durie, MD.,have documented all relevant documentation on the behalf of Wilhemena Durie, MD,as directed by  Wilhemena Durie, MD while in the presence of Wilhemena Durie, MD.   Patient: Mark HORA Sr.   DOB: 1932-12-04   88 y.o. Male  MRN: [id number] Visit Date: 03/05/2022  Today's healthcare provider: Wilhemena Durie, MD   Chief Complaint  Patient presents with   Follow-up   Hypertension   Subjective    HPI  She comes in today for follow-up.  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  He feels well.  Blood pressure at home today was 131/60.  He does not have any problems with reflux or abdominal pain and he is tolerating his medications well.  He does need his pantoprazole daily Hypertension, follow-up  BP Readings from Last 3 Encounters:  03/05/22 (!) 146/56  08/28/21 (!) 140/53  02/27/21 (!) 144/64   Wt Readings from Last 3 Encounters:  03/05/22 154 lb (69.9 kg)  08/28/21 152 lb (68.9 kg)  02/27/21 158 lb (71.7 kg)     He was last seen for hypertension 6 months ago.  Management since that visit includes; Increased telmisartan from 20 to 40 mg daily..  Outside blood pressures are 156/63--123/54.  Pertinent labs Lab Results  Component Value Date   CHOL 145 03/03/2021   HDL 50 03/03/2021   LDLCALC 85 03/03/2021   TRIG 44 03/03/2021   CHOLHDL 2.9 03/03/2021   Lab Results  Component Value Date   NA 139 03/03/2021   K 4.3 03/03/2021   CREATININE 0.93 03/03/2021   EGFR 80 03/03/2021   GLUCOSE 97 03/03/2021   TSH 4.210 03/03/2021     The ASCVD Risk score (Arnett DK, et al., 2019) failed to calculate for the following reasons:   The 2019 ASCVD risk score is only valid for ages 37 to 90  ---------------------------------------------------------------------------------------------------   Medications: Outpatient Medications Prior to Visit  Medication  Sig   amLODipine (NORVASC) 10 MG tablet Take 0.5 tablets by mouth daily.   amLODipine (NORVASC) 5 MG tablet Take 1 tablet (5 mg total) by mouth daily.   aspirin 81 MG EC tablet Take 1 tablet by mouth daily.   Brinzolamide-Brimonidine (SIMBRINZA) 1-0.2 % SUSP Place 3 drops into both eyes daily. As needed   carboxymethylcellulose (REFRESH PLUS) 0.5 % SOLN Apply to eye.   cetirizine (ZYRTEC) 10 MG tablet Take 10 mg by mouth daily.   dorzolamide-timolol (COSOPT) 22.3-6.8 MG/ML ophthalmic solution INSTILL 1 DROP IN RIGHT EYE TWO TIMES A DAY GLAUCOMA   fluticasone (FLONASE) 50 MCG/ACT nasal spray TAKE TWO PUFFS EACH NOSTRIL DAILY.   metoprolol tartrate (LOPRESSOR) 25 MG tablet Take 0.5 tablets (12.5 mg total) by mouth 3 (three) times daily with meals.   metoprolol tartrate (LOPRESSOR) 25 MG tablet Take by mouth.   mometasone (ASMANEX) 220 MCG/INH inhaler Inhale 1 puff into the lungs daily.    Multiple Vitamins-Minerals (PRESERVISION AREDS 2) CAPS TAKE  BY MOUTH TWO TIMES A DAY   pantoprazole (PROTONIX) 40 MG tablet Take 1 tablet (40 mg total) by mouth 2 (two) times daily.   pravastatin (PRAVACHOL) 20 MG tablet Take 1 tablet (20 mg total) by mouth nightly   prednisoLONE acetate (PRED FORTE) 1 % ophthalmic suspension    telmisartan (MICARDIS) 40 MG tablet Take 1 tablet (40 mg total) by mouth daily.   terazosin (  HYTRIN) 5 MG capsule Take 5 mg by mouth at bedtime.   triamcinolone ointment (KENALOG) 0.1 % SMARTSIG:1 Topical Daily PRN   Wheat Dextrin (BENEFIBER DRINK MIX PO) Take by mouth.   [DISCONTINUED] amoxicillin (AMOXIL) 500 MG tablet Take 500 mg by mouth as directed. 4 tablets prior to dental procedures. (Patient not taking: Reported on 11/13/2021)   [DISCONTINUED] atropine 1 % ophthalmic solution INSTILL 1 DROP IN RIGHT EYE TWO TIMES A DAY SEND TO 4B AMB SURGERY OR 10/26/21 (Patient not taking: Reported on 11/13/2021)   [DISCONTINUED] carboxymethylcellulose (REFRESH PLUS) 0.5 % SOLN Place 1 drop into  both eyes 2 (two) times daily as needed.  (Patient not taking: Reported on 11/13/2021)   [DISCONTINUED] Carboxymethylcellulose Sodium 1 % GEL APPLY 1 DROP IN EACH EYE FOUR TIMES A DAY FOR DRY EYE(S) (Patient not taking: Reported on 11/13/2021)   [DISCONTINUED] ciprofloxacin (CILOXAN) 0.3 % ophthalmic solution INSTILL 1 DROP IN RIGHT EYE FOUR TIMES A DAY (ANTIBIOTIC EYE DROPS) SEND TO 4B AMB SURGERY 1/12 ON DAY OF SURGERY (Patient not taking: Reported on 11/13/2021)   [DISCONTINUED] latanoprost (XALATAN) 0.005 % ophthalmic solution Place 1 drop at bedtime into both eyes. (Patient not taking: Reported on 11/13/2021)   [DISCONTINUED] Multiple Vitamin (MULTI-VITAMINS) TABS Take by mouth daily. (Patient not taking: Reported on 11/13/2021)   [DISCONTINUED] neomycin-polymyxin b-dexamethasone (MAXITROL) 3.5-10000-0.1 SUSP Place 1 drop into both eyes as needed.  (Patient not taking: Reported on 11/13/2021)   [DISCONTINUED] Netarsudil Dimesylate 0.02 % SOLN Apply 1 drop to eye at bedtime. (Patient not taking: Reported on 11/13/2021)   [DISCONTINUED] polyethylene glycol (MIRALAX / GLYCOLAX) 17 g packet Take 17 g by mouth daily as needed.  (Patient not taking: Reported on 11/13/2021)   [DISCONTINUED] pravastatin (PRAVACHOL) 20 MG tablet Take by mouth. (Patient not taking: Reported on 11/13/2021)   [DISCONTINUED] pravastatin (PRAVACHOL) 40 MG tablet Take 0.5 tablets by mouth at bedtime. (Patient not taking: Reported on 11/13/2021)   [DISCONTINUED] telmisartan (MICARDIS) 20 MG tablet Take 1 tablet by mouth daily. (Patient not taking: Reported on 03/05/2022)   [DISCONTINUED] timolol (BETIMOL) 0.25 % ophthalmic solution 1-2 drops 2 (two) times daily. (Patient not taking: Reported on 11/13/2021)   [DISCONTINUED] vitamin B-12 (CYANOCOBALAMIN) 500 MCG tablet Take 500 mcg by mouth daily. (Patient not taking: Reported on 11/13/2021)   No facility-administered medications prior to visit.    Review of Systems  Constitutional:   Negative for appetite change, chills and fever.  Respiratory:  Negative for chest tightness, shortness of breath and wheezing.   Cardiovascular:  Negative for chest pain and palpitations.  Gastrointestinal:  Negative for abdominal pain, nausea and vomiting.   Last lipids Lab Results  Component Value Date   CHOL 145 03/03/2021   HDL 50 03/03/2021   LDLCALC 85 03/03/2021   TRIG 44 03/03/2021   CHOLHDL 2.9 03/03/2021       Objective    BP (!) 146/56 (BP Location: Left Arm, Patient Position: Sitting, Cuff Size: Normal)   Pulse 62   Resp 16   Wt 154 lb (69.9 kg)   SpO2 99%   BMI 22.74 kg/m  BP Readings from Last 3 Encounters:  03/05/22 (!) 146/56  08/28/21 (!) 140/53  02/27/21 (!) 144/64   Wt Readings from Last 3 Encounters:  03/05/22 154 lb (69.9 kg)  08/28/21 152 lb (68.9 kg)  02/27/21 158 lb (71.7 kg)      Physical Exam Vitals reviewed.  Constitutional:      Appearance: Normal appearance.  HENT:  Head: Normocephalic and atraumatic.     Right Ear: External ear normal.     Left Ear: External ear normal.  Eyes:     General: No scleral icterus.    Conjunctiva/sclera: Conjunctivae normal.  Cardiovascular:     Rate and Rhythm: Normal rate and regular rhythm.     Heart sounds: Normal heart sounds.  Pulmonary:     Effort: Pulmonary effort is normal.     Breath sounds: Normal breath sounds.  Abdominal:     Palpations: Abdomen is soft. There is no mass.  Skin:    General: Skin is warm and dry.  Neurological:     General: No focal deficit present.     Mental Status: He is alert and oriented to person, place, and time.  Psychiatric:        Mood and Affect: Mood normal.        Behavior: Behavior normal.        Thought Content: Thought content normal.        Judgment: Judgment normal.      No results found for any visits on 03/05/22.  Assessment & Plan     1. Essential (primary) hypertension Good control on meds.  Blood pressure readings are excellent. -  telmisartan (MICARDIS) 40 MG tablet; Take 1 tablet (40 mg total) by mouth daily.  Dispense: 90 tablet; Refill: 3 - COMPLETE METABOLIC PANEL WITH GFR - TSH - Lipid Profile  2. Hyperlipidemia associated with type 2 diabetes mellitus ([place]) On pravastatin - CBC  3. Peripheral venous insufficiency   4. Paroxysmal atrial fibrillation (HCC)   5. Nonrheumatic aortic valve insufficiency   6. Gastroesophageal reflux disease with esophagitis, unspecified whether hemorrhage On pantoprazole necessarily for symptom control  7. Primary open-angle glaucoma, right eye, severe stage   8. Mixed hyperlipidemia   9. History of retroperitoneal fibrosis This is been a nonissue for several years.  10. Chronic kidney disease, unspecified CKD stage Avoid nonsteroidal  11. Mild cognitive disorder MMSE on next visit in the fall.   No follow-ups on file.      I, Wilhemena Durie, MD, have reviewed all documentation for this visit. The documentation on 03/05/22 for the exam, diagnosis, procedures, and orders are all accurate and complete.    Sylvia Kondracki Cranford Mon, MD  [facility] [phone] (phone) [phone] (fax)  [place]

## 2022-03-16 DIAGNOSIS — E785 Hyperlipidemia, unspecified: Secondary | ICD-10-CM | POA: Diagnosis not present

## 2022-03-16 DIAGNOSIS — I10 Essential (primary) hypertension: Secondary | ICD-10-CM | POA: Diagnosis not present

## 2022-03-16 DIAGNOSIS — E1169 Type 2 diabetes mellitus with other specified complication: Secondary | ICD-10-CM | POA: Diagnosis not present

## 2022-03-17 LAB — COMPREHENSIVE METABOLIC PANEL
ALT: 11 IU/L (ref 0–44)
AST: 16 IU/L (ref 0–40)
Albumin/Globulin Ratio: 1.9 (ref 1.2–2.2)
Albumin: 3.7 g/dL (ref 3.6–4.6)
Alkaline Phosphatase: 67 IU/L (ref 44–121)
BUN/Creatinine Ratio: 25 — ABNORMAL HIGH (ref 10–24)
BUN: 22 mg/dL (ref 8–27)
Bilirubin Total: 0.5 mg/dL (ref 0.0–1.2)
CO2: 22 mmol/L (ref 20–29)
Calcium: 8.6 mg/dL (ref 8.6–10.2)
Chloride: 109 mmol/L — ABNORMAL HIGH (ref 96–106)
Creatinine, Ser: 0.87 mg/dL (ref 0.76–1.27)
Globulin, Total: 2 g/dL (ref 1.5–4.5)
Glucose: 95 mg/dL (ref 70–99)
Potassium: 4.3 mmol/L (ref 3.5–5.2)
Sodium: 141 mmol/L (ref 134–144)
Total Protein: 5.7 g/dL — ABNORMAL LOW (ref 6.0–8.5)
eGFR: 84 mL/min/{1.73_m2} (ref >59)

## 2022-03-17 LAB — CBC
Hematocrit: 39.3 % (ref 37.5–51.0)
Hemoglobin: 13 g/dL (ref 13.0–17.7)
MCH: 30.9 pg (ref 26.6–33.0)
MCHC: 33.1 g/dL (ref 31.5–35.7)
MCV: 93 fL (ref 79–97)
Platelets: 170 10*3/uL (ref 150–450)
RBC: 4.21 x10E6/uL (ref 4.14–5.80)
RDW: 12.3 % (ref 11.6–15.4)
WBC: 7.4 10*3/uL (ref 3.4–10.8)

## 2022-03-17 LAB — LIPID PANEL
Chol/HDL Ratio: 2.7 ratio (ref 0.0–5.0)
Cholesterol, Total: 128 mg/dL (ref 100–199)
HDL: 47 mg/dL (ref >39)
LDL Chol Calc (NIH): 71 mg/dL (ref 0–99)
Triglycerides: 40 mg/dL (ref 0–149)
VLDL Cholesterol Cal: 10 mg/dL (ref 5–40)

## 2022-03-17 LAB — TSH: TSH: 3.98 u[IU]/mL (ref 0.450–4.500)

## 2022-03-26 DIAGNOSIS — D2262 Melanocytic nevi of left upper limb, including shoulder: Secondary | ICD-10-CM | POA: Diagnosis not present

## 2022-03-26 DIAGNOSIS — D2261 Melanocytic nevi of right upper limb, including shoulder: Secondary | ICD-10-CM | POA: Diagnosis not present

## 2022-03-26 DIAGNOSIS — D225 Melanocytic nevi of trunk: Secondary | ICD-10-CM | POA: Diagnosis not present

## 2022-03-26 DIAGNOSIS — D2272 Melanocytic nevi of left lower limb, including hip: Secondary | ICD-10-CM | POA: Diagnosis not present

## 2022-03-26 DIAGNOSIS — L570 Actinic keratosis: Secondary | ICD-10-CM | POA: Diagnosis not present

## 2022-03-26 DIAGNOSIS — X32XXXA Exposure to sunlight, initial encounter: Secondary | ICD-10-CM | POA: Diagnosis not present

## 2022-03-26 DIAGNOSIS — Z85828 Personal history of other malignant neoplasm of skin: Secondary | ICD-10-CM | POA: Diagnosis not present

## 2022-03-26 DIAGNOSIS — L821 Other seborrheic keratosis: Secondary | ICD-10-CM | POA: Diagnosis not present

## 2022-04-04 ENCOUNTER — Other Ambulatory Visit: Payer: Self-pay | Admitting: Family Medicine

## 2022-05-16 ENCOUNTER — Encounter: Payer: Self-pay | Admitting: Family Medicine

## 2022-05-16 ENCOUNTER — Ambulatory Visit (INDEPENDENT_AMBULATORY_CARE_PROVIDER_SITE_OTHER): Payer: PPO | Admitting: Family Medicine

## 2022-05-16 VITALS — BP 124/61 | HR 67 | Resp 16 | Wt 150.0 lb

## 2022-05-16 DIAGNOSIS — K5909 Other constipation: Secondary | ICD-10-CM

## 2022-05-16 DIAGNOSIS — N189 Chronic kidney disease, unspecified: Secondary | ICD-10-CM | POA: Diagnosis not present

## 2022-05-16 DIAGNOSIS — R5383 Other fatigue: Secondary | ICD-10-CM

## 2022-05-16 DIAGNOSIS — I872 Venous insufficiency (chronic) (peripheral): Secondary | ICD-10-CM

## 2022-05-16 DIAGNOSIS — E7800 Pure hypercholesterolemia, unspecified: Secondary | ICD-10-CM | POA: Diagnosis not present

## 2022-05-16 DIAGNOSIS — K2100 Gastro-esophageal reflux disease with esophagitis, without bleeding: Secondary | ICD-10-CM | POA: Diagnosis not present

## 2022-05-16 DIAGNOSIS — H903 Sensorineural hearing loss, bilateral: Secondary | ICD-10-CM

## 2022-05-16 DIAGNOSIS — R140 Abdominal distension (gaseous): Secondary | ICD-10-CM | POA: Diagnosis not present

## 2022-05-16 DIAGNOSIS — M19172 Post-traumatic osteoarthritis, left ankle and foot: Secondary | ICD-10-CM

## 2022-05-16 DIAGNOSIS — M791 Myalgia, unspecified site: Secondary | ICD-10-CM | POA: Diagnosis not present

## 2022-05-16 DIAGNOSIS — I8390 Asymptomatic varicose veins of unspecified lower extremity: Secondary | ICD-10-CM | POA: Diagnosis not present

## 2022-05-16 DIAGNOSIS — D331 Benign neoplasm of brain, infratentorial: Secondary | ICD-10-CM

## 2022-05-16 DIAGNOSIS — N135 Crossing vessel and stricture of ureter without hydronephrosis: Secondary | ICD-10-CM

## 2022-05-16 DIAGNOSIS — D320 Benign neoplasm of cerebral meninges: Secondary | ICD-10-CM

## 2022-05-16 DIAGNOSIS — F09 Unspecified mental disorder due to known physiological condition: Secondary | ICD-10-CM

## 2022-05-16 MED ORDER — SUCRALFATE 1 G PO TABS: 1.0000 g | ORAL_TABLET | Freq: Three times a day (TID) | ORAL | 5 refills | Status: AC

## 2022-05-16 NOTE — Progress Notes (Unsigned)
Established patient visit  I,April Miller,acting as a scribe for Wilhemena Durie, MD.,have documented all relevant documentation on the behalf of Wilhemena Durie, MD,as directed by  Wilhemena Durie, MD while in the presence of Wilhemena Durie, MD.   Patient: Mark WEBB Sr.   DOB: 1932/11/28   88 y.o. Male  MRN: [id number] Visit Date: 05/16/2022  Today's healthcare provider: Wilhemena Durie, MD   No chief complaint on file.  Subjective    HPI  Patient has had bloating, gas, indigestion, and nausea for around one month. Patient also has not been having normal bowel movements. Usually with the aid of laxatives. Patient also is exteremly fatigued.  He is worried about recurrence of retroperitoneal fibrosis.  This originally occurred 10 years ago.  He has done very well since then. Medications: Outpatient Medications Prior to Visit  Medication Sig   amLODipine (NORVASC) 10 MG tablet Take 0.5 tablets by mouth daily.   amLODipine (NORVASC) 5 MG tablet Take 1 tablet (5 mg total) by mouth daily.   aspirin 81 MG EC tablet Take 1 tablet by mouth daily.   Brinzolamide-Brimonidine (SIMBRINZA) 1-0.2 % SUSP Place 3 drops into both eyes daily. As needed   carboxymethylcellulose (REFRESH PLUS) 0.5 % SOLN Apply to eye.   cetirizine (ZYRTEC) 10 MG tablet Take 10 mg by mouth daily.   dorzolamide-timolol (COSOPT) 22.3-6.8 MG/ML ophthalmic solution INSTILL 1 DROP IN RIGHT EYE TWO TIMES A DAY GLAUCOMA   fluticasone (FLONASE) 50 MCG/ACT nasal spray TAKE TWO PUFFS EACH NOSTRIL DAILY.   metoprolol tartrate (LOPRESSOR) 25 MG tablet Take 0.5 tablets (12.5 mg total) by mouth 3 (three) times daily with meals.   metoprolol tartrate (LOPRESSOR) 25 MG tablet Take by mouth.   mometasone (ASMANEX) 220 MCG/INH inhaler Inhale 1 puff into the lungs daily.    Multiple Vitamins-Minerals (PRESERVISION AREDS 2) CAPS TAKE  BY MOUTH TWO TIMES A DAY   pantoprazole (PROTONIX) 40 MG tablet Take 1 tablet  (40 mg total) by mouth 2 (two) times daily.   pravastatin (PRAVACHOL) 20 MG tablet Take 1 tablet (20 mg total) by mouth nightly   telmisartan (MICARDIS) 40 MG tablet Take 1 tablet (40 mg total) by mouth daily.   terazosin (HYTRIN) 5 MG capsule Take 5 mg by mouth at bedtime.   triamcinolone ointment (KENALOG) 0.1 % SMARTSIG:1 Topical Daily PRN   Wheat Dextrin (BENEFIBER DRINK MIX PO) Take by mouth.   [DISCONTINUED] prednisoLONE acetate (PRED FORTE) 1 % ophthalmic suspension  (Patient not taking: Reported on 05/16/2022)   No facility-administered medications prior to visit.    Review of Systems  Constitutional:  Negative for appetite change, chills and fever.  Respiratory:  Negative for chest tightness, shortness of breath and wheezing.   Cardiovascular:  Negative for chest pain and palpitations.  Gastrointestinal:  Negative for abdominal pain, nausea and vomiting.    Last metabolic panel Lab Results  Component Value Date   GLUCOSE 96 05/16/2022   NA 140 05/16/2022   K 4.6 05/16/2022   CL 106 05/16/2022   CO2 20 05/16/2022   BUN 25 05/16/2022   CREATININE 1.24 05/16/2022   EGFR 56 (L) 05/16/2022   CALCIUM 8.8 05/16/2022   PROT 6.5 05/16/2022   ALBUMIN 4.2 05/16/2022   LABGLOB 2.3 05/16/2022   AGRATIO 1.8 05/16/2022   BILITOT 0.4 05/16/2022   ALKPHOS 72 05/16/2022   AST 19 05/16/2022   ALT 10 05/16/2022   ANIONGAP 5 (L) 01/10/2015  Objective    BP 124/61 (BP Location: Right Arm, Patient Position: Sitting, Cuff Size: Normal)   Pulse 67   Resp 16   Wt 150 lb (68 kg)   SpO2 97%   BMI 22.15 kg/m  BP Readings from Last 3 Encounters:  05/16/22 124/61  03/05/22 (!) 146/56  08/28/21 (!) 140/53   Wt Readings from Last 3 Encounters:  05/16/22 150 lb (68 kg)  03/05/22 154 lb (69.9 kg)  08/28/21 152 lb (68.9 kg)      Physical Exam Vitals reviewed.  Constitutional:      General: He is not in acute distress.    Appearance: He is well-developed.  HENT:     Head:  Normocephalic and atraumatic.     Right Ear: Hearing normal.     Left Ear: Hearing normal.     Nose: Nose normal.  Eyes:     General: Lids are normal. No scleral icterus.       Right eye: No discharge.        Left eye: No discharge.     Conjunctiva/sclera: Conjunctivae normal.  Cardiovascular:     Rate and Rhythm: Normal rate and regular rhythm.     Heart sounds: Normal heart sounds.  Pulmonary:     Effort: Pulmonary effort is normal. No respiratory distress.  Abdominal:     General: There is no distension.     Palpations: Abdomen is soft. There is no mass.     Tenderness: There is no abdominal tenderness.  Skin:    Findings: No lesion or rash.  Neurological:     General: No focal deficit present.     Mental Status: He is alert and oriented to person, place, and time.  Psychiatric:        Mood and Affect: Mood normal.        Speech: Speech normal.        Behavior: Behavior normal.        Thought Content: Thought content normal.        Judgment: Judgment normal.       No results found for any visits on 05/16/22.  Assessment & Plan     1. Fatigue, unspecified type Lab work.  No evidence of cardiovascular disease/angina or heart failure. - CBC w/Diff/Platelet - TSH - CK (Creatine Kinase) - Sed Rate (ESR)  2. Gastroesophageal reflux disease with esophagitis, unspecified whether hemorrhage I had sucralfate to his regimen - Comprehensive Metabolic Panel (CMET)  3. Other constipation Try Metamucil daily - Comprehensive Metabolic Panel (CMET)  4. Abdominal bloating CT scan to look for progression of retroperitoneal fibrosis and we will go ahead and refer to GI for evaluation as he and his wife are both very anxious about this. - Comprehensive Metabolic Panel (CMET)  5. Retroperitoneal fibrosis  - Comprehensive Metabolic Panel (CMET)  6. Myalgia Consider stopping pravastatin as certainly I have seen does contribute as significant fatigue - CK (Creatine Kinase) -  Sed Rate (ESR)   No follow-ups on file.      I, Wilhemena Durie, MD, have reviewed all documentation for this visit. The documentation on 05/17/22 for the exam, diagnosis, procedures, and orders are all accurate and complete.    Analena Gama Cranford Mon, MD  [facility] [phone] (phone) [phone] (fax)  [place]

## 2022-05-17 LAB — COMPREHENSIVE METABOLIC PANEL
ALT: 10 IU/L (ref 0–44)
AST: 19 IU/L (ref 0–40)
Albumin/Globulin Ratio: 1.8 (ref 1.2–2.2)
Albumin: 4.2 g/dL (ref 3.7–4.7)
Alkaline Phosphatase: 72 IU/L (ref 44–121)
BUN/Creatinine Ratio: 20 (ref 10–24)
BUN: 25 mg/dL (ref 8–27)
Bilirubin Total: 0.4 mg/dL (ref 0.0–1.2)
CO2: 20 mmol/L (ref 20–29)
Calcium: 8.8 mg/dL (ref 8.6–10.2)
Chloride: 106 mmol/L (ref 96–106)
Creatinine, Ser: 1.24 mg/dL (ref 0.76–1.27)
Globulin, Total: 2.3 g/dL (ref 1.5–4.5)
Glucose: 96 mg/dL (ref 70–99)
Potassium: 4.6 mmol/L (ref 3.5–5.2)
Sodium: 140 mmol/L (ref 134–144)
Total Protein: 6.5 g/dL (ref 6.0–8.5)
eGFR: 56 mL/min/{1.73_m2} — ABNORMAL LOW (ref >59)

## 2022-05-17 LAB — CBC WITH DIFFERENTIAL/PLATELET
Basophils Absolute: 0.1 10*3/uL (ref 0.0–0.2)
Basos: 1 %
EOS (ABSOLUTE): 0.5 10*3/uL — ABNORMAL HIGH (ref 0.0–0.4)
Eos: 8 %
Hematocrit: 38.9 % (ref 37.5–51.0)
Hemoglobin: 13.4 g/dL (ref 13.0–17.7)
Immature Grans (Abs): 0 10*3/uL (ref 0.0–0.1)
Immature Granulocytes: 0 %
Lymphocytes Absolute: 2 10*3/uL (ref 0.7–3.1)
Lymphs: 28 %
MCH: 31.6 pg (ref 26.6–33.0)
MCHC: 34.4 g/dL (ref 31.5–35.7)
MCV: 92 fL (ref 79–97)
Monocytes Absolute: 0.7 10*3/uL (ref 0.1–0.9)
Monocytes: 10 %
Neutrophils Absolute: 3.7 10*3/uL (ref 1.4–7.0)
Neutrophils: 53 %
Platelets: 172 10*3/uL (ref 150–450)
RBC: 4.24 x10E6/uL (ref 4.14–5.80)
RDW: 12.6 % (ref 11.6–15.4)
WBC: 7 10*3/uL (ref 3.4–10.8)

## 2022-05-17 LAB — CK: Total CK: 67 U/L (ref 30–208)

## 2022-05-17 LAB — TSH: TSH: 3.28 u[IU]/mL (ref 0.450–4.500)

## 2022-05-17 LAB — SEDIMENTATION RATE: Sed Rate: 8 mm/hr (ref 0–30)

## 2022-05-17 NOTE — Addendum Note (Signed)
Addended by: Julieta Bellini on: 05/17/2022 04:20 PM   Modules accepted: Orders

## 2022-06-04 NOTE — Progress Notes (Unsigned)
Established patient visit  I,April Miller,acting as a scribe for Wilhemena Durie, MD.,have documented all relevant documentation on the behalf of Wilhemena Durie, MD,as directed by  Wilhemena Durie, MD while in the presence of Wilhemena Durie, MD.   Patient: Mark VUE Sr.   DOB: 1933/01/30   88 y.o. Male  MRN: [id number] Visit Date: 06/05/2022  Today's healthcare provider: Wilhemena Durie, MD   Chief Complaint  Patient presents with   Follow-up   Subjective    HPI  Patient is an 88 year old male who presents for follow up of fatigue, myalgias, GERD and bloating.   Patient was referred to GI for the bloating and had Sucralfate added to his medications for the GERD. Pravastatin was discontinued to see if it had an effect on his fatigue and myalgias.   Patient states he is feeling better since last visit. However he has been constipated. His stomach rolls and grumbles, but can't pass stool.  Patient feels much better since his last visit. Medications: Outpatient Medications Prior to Visit  Medication Sig   amLODipine (NORVASC) 10 MG tablet Take 0.5 tablets by mouth daily.   amLODipine (NORVASC) 5 MG tablet Take 1 tablet (5 mg total) by mouth daily.   aspirin 81 MG EC tablet Take 1 tablet by mouth daily.   Brinzolamide-Brimonidine (SIMBRINZA) 1-0.2 % SUSP Place 3 drops into both eyes daily. As needed   carboxymethylcellulose (REFRESH PLUS) 0.5 % SOLN Apply to eye.   cetirizine (ZYRTEC) 10 MG tablet Take 10 mg by mouth daily.   dorzolamide-timolol (COSOPT) 22.3-6.8 MG/ML ophthalmic solution INSTILL 1 DROP IN RIGHT EYE TWO TIMES A DAY GLAUCOMA   fluticasone (FLONASE) 50 MCG/ACT nasal spray TAKE TWO PUFFS EACH NOSTRIL DAILY.   metoprolol tartrate (LOPRESSOR) 25 MG tablet Take 0.5 tablets (12.5 mg total) by mouth 3 (three) times daily with meals.   metoprolol tartrate (LOPRESSOR) 25 MG tablet Take by mouth.   mometasone (ASMANEX) 220 MCG/INH inhaler Inhale 1  puff into the lungs daily.    Multiple Vitamins-Minerals (PRESERVISION AREDS 2) CAPS TAKE  BY MOUTH TWO TIMES A DAY   pantoprazole (PROTONIX) 40 MG tablet Take 1 tablet (40 mg total) by mouth 2 (two) times daily.   pravastatin (PRAVACHOL) 20 MG tablet Take 1 tablet (20 mg total) by mouth nightly   sucralfate (CARAFATE) 1 g tablet Take 1 tablet (1 g total) by mouth 4 (four) times daily -  with meals and at bedtime.   telmisartan (MICARDIS) 40 MG tablet Take 1 tablet (40 mg total) by mouth daily.   terazosin (HYTRIN) 5 MG capsule Take 5 mg by mouth at bedtime.   triamcinolone ointment (KENALOG) 0.1 % SMARTSIG:1 Topical Daily PRN   Wheat Dextrin (BENEFIBER DRINK MIX PO) Take by mouth.   No facility-administered medications prior to visit.    Review of Systems  Last metabolic panel Lab Results  Component Value Date   GLUCOSE 96 05/16/2022   NA 140 05/16/2022   K 4.6 05/16/2022   CL 106 05/16/2022   CO2 20 05/16/2022   BUN 25 05/16/2022   CREATININE 1.24 05/16/2022   EGFR 56 (L) 05/16/2022   CALCIUM 8.8 05/16/2022   PROT 6.5 05/16/2022   ALBUMIN 4.2 05/16/2022   LABGLOB 2.3 05/16/2022   AGRATIO 1.8 05/16/2022   BILITOT 0.4 05/16/2022   ALKPHOS 72 05/16/2022   AST 19 05/16/2022   ALT 10 05/16/2022   ANIONGAP 5 (L) 01/10/2015  Objective    BP 134/65 (BP Location: Right Arm, Patient Position: Sitting, Cuff Size: Normal)   Pulse 60   Resp 16   Wt 155 lb (70.3 kg)   SpO2 96%   BMI 22.89 kg/m  BP Readings from Last 3 Encounters:  06/05/22 134/65  05/16/22 124/61  03/05/22 (!) 146/56   Wt Readings from Last 3 Encounters:  06/05/22 155 lb (70.3 kg)  05/16/22 150 lb (68 kg)  03/05/22 154 lb (69.9 kg)      Physical Exam Vitals reviewed.  Constitutional:      General: He is not in acute distress.    Appearance: He is well-developed.  HENT:     Head: Normocephalic and atraumatic.     Right Ear: Hearing normal.     Left Ear: Hearing normal.     Nose: Nose  normal.  Eyes:     General: Lids are normal. No scleral icterus.       Right eye: No discharge.        Left eye: No discharge.     Conjunctiva/sclera: Conjunctivae normal.  Cardiovascular:     Rate and Rhythm: Normal rate and regular rhythm.     Heart sounds: Normal heart sounds.  Pulmonary:     Effort: Pulmonary effort is normal. No respiratory distress.  Skin:    General: Skin is warm and dry.     Findings: No lesion or rash.  Neurological:     General: No focal deficit present.     Mental Status: He is alert and oriented to person, place, and time.  Psychiatric:        Mood and Affect: Mood normal.        Speech: Speech normal.        Behavior: Behavior normal.        Thought Content: Thought content normal.        Judgment: Judgment normal.       No results found for any visits on 06/05/22.  Assessment & Plan     1. Gastroesophageal reflux disease without esophagitis Finish sucralfate and continue twice daily pantoprazole  2. History of retroperitoneal fibrosis Patient clinically doing well  3. Post-traumatic osteoarthritis of left ankle   4. Pure hypercholesterolemia On October 1 restart pravastatin if patient is still doing well  5. Myalgia   6. Mild cognitive disorder MMSE on next visit  7. Chronic fatigue Improving.   No follow-ups on file.      I, Wilhemena Durie, MD, have reviewed all documentation for this visit. The documentation on 06/07/22 for the exam, diagnosis, procedures, and orders are all accurate and complete.    [employer] Cranford Mon, MD  [facility] [phone] (phone) [phone] (fax)  [place]

## 2022-06-05 ENCOUNTER — Ambulatory Visit (INDEPENDENT_AMBULATORY_CARE_PROVIDER_SITE_OTHER): Payer: PPO | Admitting: Family Medicine

## 2022-06-05 ENCOUNTER — Encounter: Payer: Self-pay | Admitting: Family Medicine

## 2022-06-05 VITALS — BP 134/65 | HR 60 | Resp 16 | Wt 155.0 lb

## 2022-06-05 DIAGNOSIS — E7800 Pure hypercholesterolemia, unspecified: Secondary | ICD-10-CM

## 2022-06-05 DIAGNOSIS — K219 Gastro-esophageal reflux disease without esophagitis: Secondary | ICD-10-CM

## 2022-06-05 DIAGNOSIS — Z87448 Personal history of other diseases of urinary system: Secondary | ICD-10-CM

## 2022-06-05 DIAGNOSIS — R5382 Chronic fatigue, unspecified: Secondary | ICD-10-CM | POA: Diagnosis not present

## 2022-06-05 DIAGNOSIS — F09 Unspecified mental disorder due to known physiological condition: Secondary | ICD-10-CM | POA: Diagnosis not present

## 2022-06-05 DIAGNOSIS — M19172 Post-traumatic osteoarthritis, left ankle and foot: Secondary | ICD-10-CM

## 2022-06-05 DIAGNOSIS — M791 Myalgia, unspecified site: Secondary | ICD-10-CM

## 2022-06-05 DIAGNOSIS — Z8719 Personal history of other diseases of the digestive system: Secondary | ICD-10-CM

## 2022-06-05 NOTE — Patient Instructions (Signed)
FINISH SUCRALFATE, THAN STOP. START BACK TAKING PRAVASTATIN  07/15/2022.

## 2022-06-22 DIAGNOSIS — I351 Nonrheumatic aortic (valve) insufficiency: Secondary | ICD-10-CM | POA: Diagnosis not present

## 2022-06-22 DIAGNOSIS — I119 Hypertensive heart disease without heart failure: Secondary | ICD-10-CM | POA: Diagnosis not present

## 2022-06-22 DIAGNOSIS — I6523 Occlusion and stenosis of bilateral carotid arteries: Secondary | ICD-10-CM | POA: Diagnosis not present

## 2022-06-22 DIAGNOSIS — I350 Nonrheumatic aortic (valve) stenosis: Secondary | ICD-10-CM | POA: Diagnosis not present

## 2022-06-22 DIAGNOSIS — I10 Essential (primary) hypertension: Secondary | ICD-10-CM | POA: Diagnosis not present

## 2022-06-22 DIAGNOSIS — E782 Mixed hyperlipidemia: Secondary | ICD-10-CM | POA: Diagnosis not present

## 2022-07-09 ENCOUNTER — Other Ambulatory Visit: Payer: Self-pay | Admitting: Family Medicine

## 2022-07-09 ENCOUNTER — Other Ambulatory Visit: Payer: Self-pay

## 2022-09-10 ENCOUNTER — Ambulatory Visit: Payer: PPO | Admitting: Family Medicine

## 2022-09-13 NOTE — Progress Notes (Signed)
Complete physical exam   Patient: Mark DRAGAN Sr.   DOB: Jun 27, 1933   88 y.o. Male  MRN: [id number] Visit Date: 09/14/2022  Today's healthcare provider: Mikey Kirschner, PA-C   Cc. cpe  Subjective    Mark Sarpy. is a 88 y.o. male who presents today for a complete physical exam.  He reports consuming a general diet. Home exercise routine includes walking .5 hrs per day. He generally feels well. He reports sleeping fairly well. He does not have additional problems to discuss today.    Past Medical History:  Diagnosis Date   Arthritis    left ankle   Brain tumor (benign) (HCC)    COPD (chronic obstructive pulmonary disease) (HCC)    GERD (gastroesophageal reflux disease)    Heart murmur    Hyperlipidemia    Hypertension    Seasonal allergies    Past Surgical History:  Procedure Laterality Date   EUS  03/27/2012   Procedure: UPPER ENDOSCOPIC ULTRASOUND (EUS) LINEAR;  Surgeon: Milus Banister, MD;  Location: [facility];  Service: Endoscopy;  Laterality: N/A;  [place] EUS   FINE NEEDLE ASPIRATION  03/27/2012   Procedure: FINE NEEDLE ASPIRATION (FNA) LINEAR;  Surgeon: Milus Banister, MD;  Location: [facility];  Service: Endoscopy;  Laterality: N/A;   HERNIA REPAIR     ORIF ANKLE FRACTURE     Social History   Socioeconomic History   Marital status: Married    Spouse name: Not on file   Number of children: 1   Years of education: Not on file   Highest education level: High school graduate  Occupational History   Occupation: part time  Tobacco Use   Smoking status: Former    Types: Cigars    Quit date: 10/15/1999    Years since quitting: 22.9   Smokeless tobacco: Never   Tobacco comments:    quit about 15 years ago  Vaping Use   Vaping Use: Never used  Substance and Sexual Activity   Alcohol use: Yes    Comment: occasionally, 1-2 drinks monthly   Drug use: No   Sexual activity: Not Currently  Other Topics Concern   Not on file  Social  History Narrative   Not on file   Social Determinants of Health   Financial Resource Strain: Low Risk  (11/13/2021)   Overall Financial Resource Strain (CARDIA)    Difficulty of Paying Living Expenses: Not hard at all  Food Insecurity: No Food Insecurity (11/13/2021)   Hunger Vital Sign    Worried About Running Out of Food in the Last Year: Never true    [place] in the Last Year: Never true  Transportation Needs: No Transportation Needs (11/13/2021)   PRAPARE - Hydrologist (Medical): No    Lack of Transportation (Non-Medical): No  Physical Activity: Insufficiently Active (11/13/2021)   Exercise Vital Sign    Days of Exercise per Week: 2 days    Minutes of Exercise per Session: 30 min  Stress: No Stress Concern Present (11/13/2021)   [place]    Feeling of Stress : Only a little  Social Connections: Moderately Integrated (11/13/2021)   Social Connection and Isolation Panel [NHANES]    Frequency of Communication with Friends and Family: Three times a week    Frequency of Social Gatherings with Friends and Family: More than three times a week    Attends Religious  Services: More than 4 times per year    Active Member of Clubs or Organizations: No    Attends Archivist Meetings: Never    Marital Status: Married  Human resources officer Violence: Not At Risk (11/13/2021)   Humiliation, Afraid, Rape, and Kick questionnaire    Fear of Current or Ex-Partner: No    Emotionally Abused: No    Physically Abused: No    Sexually Abused: No   Family Status  Relation Name Status   Mother  Deceased at age 37   Father  Deceased at age 47       with pneumonia, both legs amputated   Sister  Alive   Sister  Deceased   Son  Alive   Family History  Problem Relation Age of Onset   Heart disease Mother    Hypertension Mother    Arthritis Mother    Breast cancer Sister    Breast cancer  Sister    Allergies  Allergen Reactions   Brimonidine Other (See Comments)   Iodinated Contrast Media Other (See Comments)   Netarsudil Dimesylate     Other reaction(s): Pain in eye   Sulfamethoxazole-Trimethoprim Hives   Sulfa Antibiotics Itching and Rash    Allergic reaction was over 30years ago. Patient unsure of exact reaction    Patient Care Team: Jerrol Banana., MD as PCP - General (Family Medicine) Beacher May, MD as Referring Physician (Internal Medicine) Charletta Cousin, MD as Referring Physician (Ophthalmology) Corey Skains, MD as Consulting Physician (Cardiology) Chrismon, Vickki Muff, PA-C (Inactive) (Family Medicine) Dasher, Rayvon Char, MD (Dermatology)   Medications: Outpatient Medications Prior to Visit  Medication Sig   aspirin 81 MG EC tablet Take 1 tablet by mouth daily.   Brinzolamide-Brimonidine (SIMBRINZA) 1-0.2 % SUSP Place 3 drops into both eyes daily. As needed   carboxymethylcellulose (REFRESH PLUS) 0.5 % SOLN Apply to eye.   cetirizine (ZYRTEC) 10 MG tablet Take 10 mg by mouth daily.   dorzolamide-timolol (COSOPT) 22.3-6.8 MG/ML ophthalmic solution INSTILL 1 DROP IN RIGHT EYE TWO TIMES A DAY GLAUCOMA   fluticasone (FLONASE) 50 MCG/ACT nasal spray TAKE TWO PUFFS EACH NOSTRIL DAILY.   mometasone (ASMANEX) 220 MCG/INH inhaler Inhale 1 puff into the lungs daily.    Multiple Vitamins-Minerals (PRESERVISION AREDS 2) CAPS TAKE  BY MOUTH TWO TIMES A DAY   pravastatin (PRAVACHOL) 20 MG tablet Take 1 tablet (20 mg total) by mouth nightly   sucralfate (CARAFATE) 1 g tablet Take 1 tablet (1 g total) by mouth 4 (four) times daily -  with meals and at bedtime.   telmisartan (MICARDIS) 40 MG tablet Take 1 tablet (40 mg total) by mouth daily.   terazosin (HYTRIN) 5 MG capsule Take 5 mg by mouth at bedtime.   triamcinolone ointment (KENALOG) 0.1 % SMARTSIG:1 Topical Daily PRN   Wheat Dextrin (BENEFIBER DRINK MIX PO) Take by mouth.    [DISCONTINUED] amLODipine (NORVASC) 5 MG tablet Take 1 tablet (5 mg total) by mouth daily.   [DISCONTINUED] metoprolol tartrate (LOPRESSOR) 25 MG tablet Take 0.5 tablets (12.5 mg total) by mouth 3 (three) times daily with meals.   [DISCONTINUED] metoprolol tartrate (LOPRESSOR) 25 MG tablet Take by mouth.   [DISCONTINUED] pantoprazole (PROTONIX) 40 MG tablet Take 1 tablet (40 mg total) by mouth 2 (two) times daily.   No facility-administered medications prior to visit.    Review of Systems  Constitutional:  Negative for fatigue and fever.  Respiratory:  Negative for cough and  shortness of breath.   Cardiovascular:  Negative for chest pain, palpitations and leg swelling.  Genitourinary:  Positive for frequency.  Neurological:  Negative for dizziness and headaches.     Objective    Blood pressure (!) 155/61, pulse 76, temperature 97.6 F (36.4 C), weight 155 lb 12.8 oz (70.7 kg), SpO2 99 %.    Physical Exam Constitutional:      General: He is awake.     Appearance: He is well-developed.  HENT:     Head: Normocephalic.  Eyes:     Conjunctiva/sclera: Conjunctivae normal.  Cardiovascular:     Rate and Rhythm: Normal rate and regular rhythm.     Heart sounds: Normal heart sounds.  Pulmonary:     Effort: Pulmonary effort is normal.     Breath sounds: Normal breath sounds.  Musculoskeletal:     Cervical back: Normal range of motion.     Right lower leg: No edema.     Left lower leg: No edema.  Lymphadenopathy:     Cervical: No cervical adenopathy.  Skin:    General: Skin is warm.  Neurological:     Mental Status: He is alert and oriented to person, place, and time.  Psychiatric:        Attention and Perception: Attention normal.        Mood and Affect: Mood normal.        Speech: Speech normal.        Behavior: Behavior normal. Behavior is cooperative.      Last depression screening scores    09/14/2022    9:13 AM 11/13/2021    4:01 PM 11/07/2020    1:44 PM  PHQ 2/9  Scores  PHQ - 2 Score 0 0 0  PHQ- 9 Score 0  0   Last fall risk screening    09/14/2022    9:13 AM  [place] in the past year? 0  Number falls in past yr: 0  Injury with Fall? 0   Last Audit-C alcohol use screening    09/14/2022    9:14 AM  Alcohol Use Disorder Test (AUDIT)  1. How often do you have a drink containing alcohol? 2  2. How many drinks containing alcohol do you have on a typical day when you are drinking? 0  3. How often do you have six or more drinks on one occasion? 0  AUDIT-C Score 2   A score of 3 or more in women, and 4 or more in men indicates increased risk for alcohol abuse, EXCEPT if all of the points are from question 1   No results found for any visits on 09/14/22.  Assessment & Plan    Routine Health Maintenance and Physical Exam  Exercise Activities and Dietary recommendations  Goals      DIET - EAT MORE FRUITS AND VEGETABLES     Increase water intake     Recommend drinking 6-8 glasses of water daily.       --balanced diet high in fiber and protein, low in sugars, carbs, fats. --physical activity/exercise 30 minutes 3-5 times a week    Immunization History  Administered Date(s) Administered   Fluad Quad(high Dose 65+) 07/25/2020, 08/28/2021   Influenza, High Dose Seasonal PF 09/09/2016, 07/27/2019   Influenza-Unspecified 08/11/2017, 07/21/2018   PFIZER Comirnaty(Gray Top)Covid-19 Tri-Sucrose Vaccine 11/13/2019, 12/02/2019, 08/15/2020   Pneumococcal Conjugate-13 10/30/2013, 08/16/2014   Pneumococcal Polysaccharide-23 11/15/2003, 02/15/2015   Td 01/24/2004, 04/16/2011, 05/25/2016   Tdap 04/16/2011  Health Maintenance  Topic Date Due   Zoster Vaccines- Shingrix (1 of 2) Never done   Medicare Annual Wellness (AWV)  11/13/2022   INFLUENZA VACCINE  01/13/2023 (Originally 05/15/2022)   DTaP/Tdap/Td (5 - Td or Tdap) 05/25/2026   Pneumonia Vaccine 45+ Years old  Completed   HPV VACCINES  Aged Out   COVID-19 Vaccine  Discontinued     Discussed health benefits of physical activity, and encouraged him to engage in regular exercise appropriate for his age and condition.  Problem List Items Addressed This Visit       Cardiovascular and Mediastinum   Hypertension    Chronic, moderate control, but on review of cardiology notes; no changes to be made. Pt reports values in range in the afternoon/evening Managed with temlisartan 40 mg, amlodipine 5 mg, metoprolol 12.5 mg tid; also takes terazosin 5 mg for bph.  Ordered cmp  F/u 6 mo      Relevant Medications   amLODipine (NORVASC) 5 MG tablet   metoprolol tartrate (LOPRESSOR) 25 MG tablet   Other Relevant Orders   Comprehensive Metabolic Panel (CMET)   Aortic valve stenosis    Murmur present  F/b cardiology       Relevant Medications   amLODipine (NORVASC) 5 MG tablet   metoprolol tartrate (LOPRESSOR) 25 MG tablet     Digestive   Esophageal reflux    Stable, refilled pantoprazole      Relevant Medications   pantoprazole (PROTONIX) 40 MG tablet     Genitourinary   Benign prostatic hyperplasia without urinary obstruction    Chronic, stable Manages with terazosin 5 mg.          Other   Hyperlipidemia, unspecified    Reviewed lipid panel, ldl at goal  Manages with pravastatin 20 mg        Relevant Medications   amLODipine (NORVASC) 5 MG tablet   metoprolol tartrate (LOPRESSOR) 25 MG tablet   Other Visit Diagnoses     Annual physical exam    -  Primary        Return in about 6 months (around 03/16/2023) for chronic conditions.     I, Mikey Kirschner, PA-C have reviewed all documentation for this visit. The documentation on 09/14/2022  for the exam, diagnosis, procedures, and orders are all accurate and complete.    Mikey Kirschner, PA-C [facility] [address] [place], [place], [address] Office: [phone] Fax: [place]

## 2022-09-14 ENCOUNTER — Ambulatory Visit (INDEPENDENT_AMBULATORY_CARE_PROVIDER_SITE_OTHER): Payer: PPO | Admitting: Physician Assistant

## 2022-09-14 ENCOUNTER — Encounter: Payer: Self-pay | Admitting: Physician Assistant

## 2022-09-14 VITALS — BP 155/61 | HR 76 | Temp 97.6°F | Wt 155.8 lb

## 2022-09-14 DIAGNOSIS — K219 Gastro-esophageal reflux disease without esophagitis: Secondary | ICD-10-CM

## 2022-09-14 DIAGNOSIS — Z0000 Encounter for general adult medical examination without abnormal findings: Secondary | ICD-10-CM

## 2022-09-14 DIAGNOSIS — I350 Nonrheumatic aortic (valve) stenosis: Secondary | ICD-10-CM

## 2022-09-14 DIAGNOSIS — I10 Essential (primary) hypertension: Secondary | ICD-10-CM | POA: Diagnosis not present

## 2022-09-14 DIAGNOSIS — N400 Enlarged prostate without lower urinary tract symptoms: Secondary | ICD-10-CM

## 2022-09-14 DIAGNOSIS — E7800 Pure hypercholesterolemia, unspecified: Secondary | ICD-10-CM | POA: Diagnosis not present

## 2022-09-14 MED ORDER — AMLODIPINE BESYLATE 5 MG PO TABS: 5.0000 mg | ORAL_TABLET | Freq: Every day | ORAL | 3 refills | Status: AC

## 2022-09-14 MED ORDER — METOPROLOL TARTRATE 25 MG PO TABS
12.5000 mg | ORAL_TABLET | Freq: Two times a day (BID) | ORAL | Status: AC
Start: 2022-09-14 — End: ?

## 2022-09-14 MED ORDER — PANTOPRAZOLE SODIUM 40 MG PO TBEC: 40.0000 mg | DELAYED_RELEASE_TABLET | Freq: Two times a day (BID) | ORAL | 3 refills | Status: AC

## 2022-09-14 NOTE — Assessment & Plan Note (Signed)
Murmur present  F/b cardiology

## 2022-09-14 NOTE — Assessment & Plan Note (Addendum)
Reviewed lipid panel, ldl at goal  Manages with pravastatin 20 mg

## 2022-09-14 NOTE — Assessment & Plan Note (Addendum)
Chronic, moderate control, but on review of cardiology notes; no changes to be made. Pt reports values in range in the afternoon/evening Managed with temlisartan 40 mg, amlodipine 5 mg, metoprolol 12.5 mg tid; also takes terazosin 5 mg for bph.  Ordered cmp  F/u 6 mo

## 2022-09-14 NOTE — Assessment & Plan Note (Addendum)
Chronic, stable Manages with terazosin 5 mg.

## 2022-09-14 NOTE — Assessment & Plan Note (Signed)
Stable, refilled pantoprazole

## 2022-09-15 LAB — COMPREHENSIVE METABOLIC PANEL
ALT: 9 IU/L (ref 0–44)
AST: 16 IU/L (ref 0–40)
Albumin/Globulin Ratio: 1.7 (ref 1.2–2.2)
Albumin: 4.3 g/dL (ref 3.7–4.7)
Alkaline Phosphatase: 74 IU/L (ref 44–121)
BUN/Creatinine Ratio: 26 — ABNORMAL HIGH (ref 10–24)
BUN: 27 mg/dL (ref 8–27)
Bilirubin Total: 0.6 mg/dL (ref 0.0–1.2)
CO2: 23 mmol/L (ref 20–29)
Calcium: 9.2 mg/dL (ref 8.6–10.2)
Chloride: 106 mmol/L (ref 96–106)
Creatinine, Ser: 1.02 mg/dL (ref 0.76–1.27)
Globulin, Total: 2.5 g/dL (ref 1.5–4.5)
Glucose: 102 mg/dL — ABNORMAL HIGH (ref 70–99)
Potassium: 4.7 mmol/L (ref 3.5–5.2)
Sodium: 142 mmol/L (ref 134–144)
Total Protein: 6.8 g/dL (ref 6.0–8.5)
eGFR: 71 mL/min/{1.73_m2} (ref >59)

## 2022-09-18 NOTE — Progress Notes (Signed)
Called patient and reviewed all information. Patient expressed understanding and will call with any further questions.

## 2022-09-21 ENCOUNTER — Telehealth: Payer: Self-pay

## 2022-09-21 NOTE — Telephone Encounter (Signed)
Lab results placed up front for patient to receive

## 2022-09-21 NOTE — Telephone Encounter (Signed)
Copied from [place] [phone]. Topic: General - Other >> Sep 21, 2022  9:21 AM Chapman Fitch wrote: Reason for CRM: Pt would like to pick up a copy of his lab results at 11am today / please advise

## 2022-11-26 DIAGNOSIS — D2261 Melanocytic nevi of right upper limb, including shoulder: Secondary | ICD-10-CM | POA: Diagnosis not present

## 2022-11-26 DIAGNOSIS — X32XXXA Exposure to sunlight, initial encounter: Secondary | ICD-10-CM | POA: Diagnosis not present

## 2022-11-26 DIAGNOSIS — D2272 Melanocytic nevi of left lower limb, including hip: Secondary | ICD-10-CM | POA: Diagnosis not present

## 2022-11-26 DIAGNOSIS — C441122 Basal cell carcinoma of skin of right lower eyelid, including canthus: Secondary | ICD-10-CM | POA: Diagnosis not present

## 2022-11-26 DIAGNOSIS — L570 Actinic keratosis: Secondary | ICD-10-CM | POA: Diagnosis not present

## 2022-11-26 DIAGNOSIS — D485 Neoplasm of uncertain behavior of skin: Secondary | ICD-10-CM | POA: Diagnosis not present

## 2022-11-26 DIAGNOSIS — D2262 Melanocytic nevi of left upper limb, including shoulder: Secondary | ICD-10-CM | POA: Diagnosis not present

## 2022-11-26 DIAGNOSIS — Z85828 Personal history of other malignant neoplasm of skin: Secondary | ICD-10-CM | POA: Diagnosis not present

## 2022-12-11 DIAGNOSIS — J309 Allergic rhinitis, unspecified: Secondary | ICD-10-CM | POA: Diagnosis not present

## 2022-12-11 DIAGNOSIS — Z23 Encounter for immunization: Secondary | ICD-10-CM | POA: Diagnosis not present

## 2022-12-11 DIAGNOSIS — I10 Essential (primary) hypertension: Secondary | ICD-10-CM | POA: Diagnosis not present

## 2022-12-11 DIAGNOSIS — E782 Mixed hyperlipidemia: Secondary | ICD-10-CM | POA: Diagnosis not present

## 2022-12-11 DIAGNOSIS — I4891 Unspecified atrial fibrillation: Secondary | ICD-10-CM | POA: Diagnosis not present

## 2022-12-11 DIAGNOSIS — D320 Benign neoplasm of cerebral meninges: Secondary | ICD-10-CM | POA: Diagnosis not present

## 2022-12-11 DIAGNOSIS — K219 Gastro-esophageal reflux disease without esophagitis: Secondary | ICD-10-CM | POA: Diagnosis not present

## 2022-12-11 DIAGNOSIS — Z8719 Personal history of other diseases of the digestive system: Secondary | ICD-10-CM | POA: Diagnosis not present

## 2022-12-11 DIAGNOSIS — I350 Nonrheumatic aortic (valve) stenosis: Secondary | ICD-10-CM | POA: Diagnosis not present

## 2023-01-17 ENCOUNTER — Telehealth: Payer: Self-pay | Admitting: Family Medicine

## 2023-01-17 NOTE — Telephone Encounter (Signed)
Mark Sanchez. to schedule their annual wellness visit. Patient declined to schedule AWV at this time. Transferred care to Dr.Gilbert's new office.   [place] Direct Dial: [phone]

## 2023-06-25 DIAGNOSIS — E782 Mixed hyperlipidemia: Secondary | ICD-10-CM | POA: Diagnosis not present

## 2023-06-25 DIAGNOSIS — I10 Essential (primary) hypertension: Secondary | ICD-10-CM | POA: Diagnosis not present

## 2023-06-25 DIAGNOSIS — I350 Nonrheumatic aortic (valve) stenosis: Secondary | ICD-10-CM | POA: Diagnosis not present

## 2023-06-25 DIAGNOSIS — C441122 Basal cell carcinoma of skin of right lower eyelid, including canthus: Secondary | ICD-10-CM | POA: Diagnosis not present

## 2023-06-25 DIAGNOSIS — D320 Benign neoplasm of cerebral meninges: Secondary | ICD-10-CM | POA: Diagnosis not present

## 2023-06-25 DIAGNOSIS — I872 Venous insufficiency (chronic) (peripheral): Secondary | ICD-10-CM | POA: Diagnosis not present

## 2023-06-25 DIAGNOSIS — N135 Crossing vessel and stricture of ureter without hydronephrosis: Secondary | ICD-10-CM | POA: Diagnosis not present

## 2023-06-25 DIAGNOSIS — K219 Gastro-esophageal reflux disease without esophagitis: Secondary | ICD-10-CM | POA: Diagnosis not present

## 2023-06-25 DIAGNOSIS — I4891 Unspecified atrial fibrillation: Secondary | ICD-10-CM | POA: Diagnosis not present

## 2023-07-01 DIAGNOSIS — Z85828 Personal history of other malignant neoplasm of skin: Secondary | ICD-10-CM | POA: Diagnosis not present

## 2023-07-01 DIAGNOSIS — L570 Actinic keratosis: Secondary | ICD-10-CM | POA: Diagnosis not present

## 2023-07-01 DIAGNOSIS — X32XXXA Exposure to sunlight, initial encounter: Secondary | ICD-10-CM | POA: Diagnosis not present

## 2023-07-01 DIAGNOSIS — D2262 Melanocytic nevi of left upper limb, including shoulder: Secondary | ICD-10-CM | POA: Diagnosis not present

## 2023-07-01 DIAGNOSIS — D2271 Melanocytic nevi of right lower limb, including hip: Secondary | ICD-10-CM | POA: Diagnosis not present

## 2023-07-01 DIAGNOSIS — D2261 Melanocytic nevi of right upper limb, including shoulder: Secondary | ICD-10-CM | POA: Diagnosis not present

## 2023-07-05 DIAGNOSIS — I10 Essential (primary) hypertension: Secondary | ICD-10-CM | POA: Diagnosis not present

## 2023-07-05 DIAGNOSIS — E782 Mixed hyperlipidemia: Secondary | ICD-10-CM | POA: Diagnosis not present

## 2023-07-05 DIAGNOSIS — I351 Nonrheumatic aortic (valve) insufficiency: Secondary | ICD-10-CM | POA: Diagnosis not present

## 2023-07-09 ENCOUNTER — Other Ambulatory Visit: Payer: Self-pay | Admitting: Pharmacist

## 2023-07-09 NOTE — Progress Notes (Signed)
07/09/2023  Patient ID: Mark Schneiders Sr., male   DOB: 1933/07/07, 89 y.o.   MRN: [id number]  Pharmacy Quality Measure Review  This patient is appearing on a report for being at risk of failing the adherence measure for cholesterol (statin) medications this calendar year.   Medication: Pravastatin 20mg  Last fill date: 06/28/23 for 90 day supply; 12/27/22 for 90 day supply prior (3 mo gap)  Insurance report was not up to date. No action needed at this time.     Marlowe Aschoff, PharmD [facility] Phone Number: [phone]

## 2023-07-19 DIAGNOSIS — I351 Nonrheumatic aortic (valve) insufficiency: Secondary | ICD-10-CM | POA: Diagnosis not present

## 2023-08-05 DIAGNOSIS — E782 Mixed hyperlipidemia: Secondary | ICD-10-CM | POA: Diagnosis not present

## 2023-08-05 DIAGNOSIS — I351 Nonrheumatic aortic (valve) insufficiency: Secondary | ICD-10-CM | POA: Diagnosis not present

## 2023-08-05 DIAGNOSIS — I10 Essential (primary) hypertension: Secondary | ICD-10-CM | POA: Diagnosis not present

## 2023-09-28 ENCOUNTER — Other Ambulatory Visit: Payer: Self-pay | Admitting: Physician Assistant

## 2023-09-28 DIAGNOSIS — I10 Essential (primary) hypertension: Secondary | ICD-10-CM

## 2023-10-04 DIAGNOSIS — J069 Acute upper respiratory infection, unspecified: Secondary | ICD-10-CM | POA: Diagnosis not present

## 2023-10-31 ENCOUNTER — Emergency Department: Payer: PPO

## 2023-10-31 ENCOUNTER — Other Ambulatory Visit: Payer: Self-pay

## 2023-10-31 DIAGNOSIS — N189 Chronic kidney disease, unspecified: Secondary | ICD-10-CM | POA: Diagnosis not present

## 2023-10-31 DIAGNOSIS — Z7982 Long term (current) use of aspirin: Secondary | ICD-10-CM | POA: Insufficient documentation

## 2023-10-31 DIAGNOSIS — Z79899 Other long term (current) drug therapy: Secondary | ICD-10-CM | POA: Diagnosis not present

## 2023-10-31 DIAGNOSIS — Z1152 Encounter for screening for COVID-19: Secondary | ICD-10-CM | POA: Insufficient documentation

## 2023-10-31 DIAGNOSIS — J189 Pneumonia, unspecified organism: Secondary | ICD-10-CM | POA: Diagnosis not present

## 2023-10-31 DIAGNOSIS — J449 Chronic obstructive pulmonary disease, unspecified: Secondary | ICD-10-CM | POA: Insufficient documentation

## 2023-10-31 DIAGNOSIS — Z87891 Personal history of nicotine dependence: Secondary | ICD-10-CM | POA: Diagnosis not present

## 2023-10-31 DIAGNOSIS — J09X1 Influenza due to identified novel influenza A virus with pneumonia: Secondary | ICD-10-CM | POA: Diagnosis not present

## 2023-10-31 DIAGNOSIS — I129 Hypertensive chronic kidney disease with stage 1 through stage 4 chronic kidney disease, or unspecified chronic kidney disease: Secondary | ICD-10-CM | POA: Diagnosis not present

## 2023-10-31 DIAGNOSIS — E785 Hyperlipidemia, unspecified: Secondary | ICD-10-CM | POA: Insufficient documentation

## 2023-10-31 DIAGNOSIS — R5383 Other fatigue: Secondary | ICD-10-CM | POA: Diagnosis present

## 2023-10-31 DIAGNOSIS — G3184 Mild cognitive impairment, so stated: Secondary | ICD-10-CM | POA: Diagnosis not present

## 2023-10-31 LAB — CBC WITH DIFFERENTIAL/PLATELET
Abs Immature Granulocytes: 0.04 10*3/uL (ref 0.00–0.07)
Basophils Absolute: 0.1 10*3/uL (ref 0.0–0.1)
Basophils Relative: 1 %
Eosinophils Absolute: 0 10*3/uL (ref 0.0–0.5)
Eosinophils Relative: 0 %
HCT: 36.7 % — ABNORMAL LOW (ref 39.0–52.0)
Hemoglobin: 12.4 g/dL — ABNORMAL LOW (ref 13.0–17.0)
Immature Granulocytes: 0 %
Lymphocytes Relative: 10 %
Lymphs Abs: 0.9 10*3/uL (ref 0.7–4.0)
MCH: 31.6 pg (ref 26.0–34.0)
MCHC: 33.8 g/dL (ref 30.0–36.0)
MCV: 93.4 fL (ref 80.0–100.0)
Monocytes Absolute: 1 10*3/uL (ref 0.1–1.0)
Monocytes Relative: 11 %
Neutro Abs: 7.2 10*3/uL (ref 1.7–7.7)
Neutrophils Relative %: 78 %
Platelets: 147 10*3/uL — ABNORMAL LOW (ref 150–400)
RBC: 3.93 MIL/uL — ABNORMAL LOW (ref 4.22–5.81)
RDW: 13.3 % (ref 11.5–15.5)
WBC: 9.2 10*3/uL (ref 4.0–10.5)
nRBC: 0 % (ref 0.0–0.2)

## 2023-10-31 LAB — RESP PANEL BY RT-PCR (RSV, FLU A&B, COVID)  RVPGX2
Influenza A by PCR: POSITIVE — AB
Influenza B by PCR: NEGATIVE
Resp Syncytial Virus by PCR: NEGATIVE
SARS Coronavirus 2 by RT PCR: NEGATIVE

## 2023-10-31 NOTE — ED Triage Notes (Signed)
First Nurse Note;  Pt via [facility] from home. Pt went to doctor this morning was dx with flu. EMS report NV. Pt has a hx of COPD and A fib. Pt is A&Ox4 and NAD 103.6 fever  137 CBG  20 RR  88% on RA, placed on 2L [place] 97% 76 HR

## 2023-10-31 NOTE — ED Provider Triage Note (Signed)
Emergency Medicine Provider Triage Evaluation Note  Mark Schneiders Sr. , a 89 y.o. male  was evaluated in triage.  Pt complains of flu symptoms, nausea and weakness. Describes feeling very fatigued. He was diagnosed with flu earlier today. Doctor said he was hypoxic in the office earlier today.   Review of Systems  Positive: See above Negative:   Physical Exam  There were no vitals taken for this visit. Gen:   Awake, no distress   Resp:  Normal effort  MSK:   Moves extremities without difficulty  Other:    Medical Decision Making  Medically screening exam initiated at 5:53 PM.  Appropriate orders placed.  Mark Schneiders Sr. was informed that the remainder of the evaluation will be completed by another provider, this initial triage assessment does not replace that evaluation, and the importance of remaining in the ED until their evaluation is complete.     Cameron Ali, PA-C 10/31/23 1756

## 2023-10-31 NOTE — ED Notes (Signed)
Pt O2 sat during triage was 94-95% on RA. O2 removed at this time. Pt encouraged to let RN if he felt SOB or any changes occurred.

## 2023-11-01 ENCOUNTER — Observation Stay
Admission: EM | Admit: 2023-11-01 | Discharge: 2023-11-02 | Disposition: A | Discharge status: Home / Self Care | Payer: PPO | Attending: Obstetrics and Gynecology | Admitting: Obstetrics and Gynecology

## 2023-11-01 ENCOUNTER — Other Ambulatory Visit: Payer: Self-pay

## 2023-11-01 DIAGNOSIS — I10 Essential (primary) hypertension: Secondary | ICD-10-CM | POA: Diagnosis present

## 2023-11-01 DIAGNOSIS — J449 Chronic obstructive pulmonary disease, unspecified: Secondary | ICD-10-CM | POA: Diagnosis present

## 2023-11-01 DIAGNOSIS — F09 Unspecified mental disorder due to known physiological condition: Secondary | ICD-10-CM

## 2023-11-01 DIAGNOSIS — J101 Influenza due to other identified influenza virus with other respiratory manifestations: Secondary | ICD-10-CM | POA: Diagnosis not present

## 2023-11-01 DIAGNOSIS — J09X1 Influenza due to identified novel influenza A virus with pneumonia: Secondary | ICD-10-CM

## 2023-11-01 DIAGNOSIS — J189 Pneumonia, unspecified organism: Secondary | ICD-10-CM | POA: Diagnosis present

## 2023-11-01 DIAGNOSIS — N189 Chronic kidney disease, unspecified: Secondary | ICD-10-CM | POA: Diagnosis present

## 2023-11-01 DIAGNOSIS — E785 Hyperlipidemia, unspecified: Secondary | ICD-10-CM | POA: Diagnosis present

## 2023-11-01 LAB — BASIC METABOLIC PANEL
Anion gap: 12 (ref 5–15)
BUN: 26 mg/dL — ABNORMAL HIGH (ref 8–23)
CO2: 21 mmol/L — ABNORMAL LOW (ref 22–32)
Calcium: 8.3 mg/dL — ABNORMAL LOW (ref 8.9–10.3)
Chloride: 100 mmol/L (ref 98–111)
Creatinine, Ser: 1.33 mg/dL — ABNORMAL HIGH (ref 0.61–1.24)
GFR, Estimated: 51 mL/min — ABNORMAL LOW (ref >60)
Glucose, Bld: 145 mg/dL — ABNORMAL HIGH (ref 70–99)
Potassium: 4.1 mmol/L (ref 3.5–5.1)
Sodium: 133 mmol/L — ABNORMAL LOW (ref 135–145)

## 2023-11-01 LAB — URINALYSIS, ROUTINE W REFLEX MICROSCOPIC
Bilirubin Urine: NEGATIVE
Glucose, UA: NEGATIVE mg/dL
Hgb urine dipstick: NEGATIVE
Ketones, ur: 5 mg/dL — AB
Leukocytes,Ua: NEGATIVE
Nitrite: NEGATIVE
Protein, ur: NEGATIVE mg/dL
Specific Gravity, Urine: 1.021 (ref 1.005–1.030)
pH: 5 (ref 5.0–8.0)

## 2023-11-01 LAB — LIPASE, BLOOD: Lipase: 26 U/L (ref 11–51)

## 2023-11-01 LAB — STREP PNEUMONIAE URINARY ANTIGEN: Strep Pneumo Urinary Antigen: NEGATIVE

## 2023-11-01 MED ORDER — OSELTAMIVIR PHOSPHATE 75 MG PO CAPS: 75.0000 mg | ORAL_CAPSULE | Freq: Two times a day (BID) | ORAL | Status: DC

## 2023-11-01 MED ORDER — SODIUM CHLORIDE 0.9 % IV SOLN
500.0000 mg | Freq: Once | INTRAVENOUS | Status: AC
  Administered 2023-11-01: 500 mg via INTRAVENOUS
  Filled 2023-11-01: qty 5

## 2023-11-01 MED ORDER — SODIUM CHLORIDE 0.9 % IV SOLN
500.0000 mg | INTRAVENOUS | Status: DC
  Administered 2023-11-02: 500 mg via INTRAVENOUS
  Filled 2023-11-01: qty 5

## 2023-11-01 MED ORDER — OSELTAMIVIR PHOSPHATE 30 MG PO CAPS
30.0000 mg | ORAL_CAPSULE | Freq: Two times a day (BID) | ORAL | Status: DC
  Administered 2023-11-01 – 2023-11-02 (×2): 30 mg via ORAL
  Filled 2023-11-01 (×3): qty 1

## 2023-11-01 MED ORDER — OSELTAMIVIR PHOSPHATE 75 MG PO CAPS
75.0000 mg | ORAL_CAPSULE | Freq: Once | ORAL | Status: AC
  Administered 2023-11-01: 75 mg via ORAL
  Filled 2023-11-01: qty 1

## 2023-11-01 MED ORDER — CEFTRIAXONE SODIUM 1 G IJ SOLR
1.0000 g | Freq: Once | INTRAMUSCULAR | Status: AC
  Administered 2023-11-01: 1 g via INTRAVENOUS
  Filled 2023-11-01: qty 10

## 2023-11-01 MED ORDER — ENOXAPARIN SODIUM 40 MG/0.4ML IJ SOSY
40.0000 mg | PREFILLED_SYRINGE | INTRAMUSCULAR | Status: DC
Start: 2023-11-01 — End: 2023-11-02
  Administered 2023-11-01: 40 mg via SUBCUTANEOUS
  Filled 2023-11-01: qty 0.4

## 2023-11-01 MED ORDER — IPRATROPIUM-ALBUTEROL 0.5-2.5 (3) MG/3ML IN SOLN: 3.0000 mL | RESPIRATORY_TRACT | Status: DC | PRN

## 2023-11-01 MED ORDER — SODIUM CHLORIDE 0.9 % IV SOLN
2.0000 g | INTRAVENOUS | Status: DC
  Administered 2023-11-02: 2 g via INTRAVENOUS
  Filled 2023-11-01: qty 20

## 2023-11-01 MED ORDER — ACETAMINOPHEN 500 MG PO TABS
1000.0000 mg | ORAL_TABLET | Freq: Once | ORAL | Status: AC
  Administered 2023-11-02: 1000 mg via ORAL
  Filled 2023-11-01: qty 2

## 2023-11-01 MED ORDER — SODIUM CHLORIDE 0.9 % IV BOLUS
1000.0000 mL | Freq: Once | INTRAVENOUS | Status: AC
  Administered 2023-11-01: 1000 mL via INTRAVENOUS

## 2023-11-01 MED ORDER — PREDNISONE 20 MG PO TABS
50.0000 mg | ORAL_TABLET | Freq: Every day | ORAL | Status: DC
  Administered 2023-11-02: 50 mg via ORAL
  Filled 2023-11-01: qty 1

## 2023-11-01 NOTE — ED Provider Notes (Addendum)
[facility] Provider Note    Event Date/Time   First MD Initiated Contact with Patient 11/01/23 0957     (approximate)   History   Nausea and Fatigue   HPI  Mark Sanchez Sr. is a 89 y.o. male with a history of hyperlipidemia, GERD, hypertension, atrial fibrillation, COPD, who presents with shortness of breath and generalized weakness over the last several days associated with cough and diarrhea.  The patient states that the symptoms started about 3 to 4 days ago and then acutely worsened yesterday.  He went to his doctor was diagnosed with influenza A.  After coming home, he started to feel really weak and ended up getting so weak that he collapsed onto the floor in the bathroom but did not completely pass out.  He feels like he is very dehydrated.  He is now feeling slightly better this morning but still unwell.  He denies any chest pain.  He has no vomiting.  I reviewed the past medical records.  The patient's most recent documented outpatient encounter is an office visit yesterday at the family medicine clinic for acute URI.  He tested positive for influenza A at that time.   Physical Exam   Triage Vital Signs: ED Triage Vitals  Encounter Vitals Group     BP 10/31/23 1754 (!) 129/59     Systolic BP Percentile --      Diastolic BP Percentile --      Pulse Rate 10/31/23 1754 86     Resp 10/31/23 1754 20     Temp 10/31/23 1754 99.9 F (37.7 C)     Temp Source 10/31/23 1754 Oral     SpO2 10/31/23 1757 94 %     Weight 10/31/23 1755 155 lb (70.3 kg)     Height --      Head Circumference --      Peak Flow --      Pain Score 10/31/23 1755 5     Pain Loc --      Pain Education --      Exclude from Growth Chart --     Most recent vital signs: Vitals:   11/01/23 0926 11/01/23 1012  BP: (!) 106/47 (!) 126/48  Pulse: 66 70  Resp: 18 (!) 24  Temp: 98 F (36.7 C)   SpO2: 94% 100%     General: Awake, no distress.  CV:  Good peripheral  perfusion.  Resp:  Normal effort.  Lungs CTAB. Abd:  No distention.  Other:  No peripheral edema.  Somewhat dry mucous membranes.   ED Results / Procedures / Treatments   Labs (all labs ordered are listed, but only abnormal results are displayed) Labs Reviewed  RESP PANEL BY RT-PCR (RSV, FLU A&B, COVID)  RVPGX2 - Abnormal; Notable for the following components:      Result Value   Influenza A by PCR POSITIVE (*)    All other components within normal limits  CBC WITH DIFFERENTIAL/PLATELET - Abnormal; Notable for the following components:   RBC 3.93 (*)    Hemoglobin 12.4 (*)    HCT 36.7 (*)    Platelets 147 (*)    All other components within normal limits  URINALYSIS, ROUTINE W REFLEX MICROSCOPIC - Abnormal; Notable for the following components:   Color, Urine AMBER (*)    APPearance HAZY (*)    Ketones, ur 5 (*)    All other components within normal limits  BASIC METABOLIC PANEL - Abnormal; Notable  for the following components:   Sodium 133 (*)    CO2 21 (*)    Glucose, Bld 145 (*)    BUN 26 (*)    Creatinine, Ser 1.33 (*)    Calcium 8.3 (*)    GFR, Estimated 51 (*)    All other components within normal limits  LIPASE, BLOOD     EKG  ED ECG REPORT I, Dionne Bucy, the attending physician, personally viewed and interpreted this ECG.  Date: 11/01/2023 EKG Time: 1045 Rate: 66 Rhythm: normal sinus rhythm QRS Axis: normal Intervals: RBBB ST/T Wave abnormalities: normal Narrative Interpretation: no evidence of acute ischemia   RADIOLOGY  Chest x-ray: I independently viewed and interpreted the images; there is a left basilar opacity concerning for pneumonia  PROCEDURES:  Critical Care performed: No  Procedures   MEDICATIONS ORDERED IN ED: Medications  cefTRIAXone (ROCEPHIN) 1 g in sodium chloride 0.9 % 100 mL IVPB (1 g Intravenous New Bag/Given 11/01/23 1040)  azithromycin (ZITHROMAX) 500 mg in sodium chloride 0.9 % 250 mL IVPB (has no administration  in time range)  sodium chloride 0.9 % bolus 1,000 mL (1,000 mLs Intravenous New Bag/Given 11/01/23 1038)     IMPRESSION / MDM / ASSESSMENT AND PLAN / ED COURSE  I reviewed the triage vital signs and the nursing notes.  89 year old male with PMH as noted above, diagnosed with flu yesterday, presents with persistent generalized weakness, near syncope, and shortness of breath.   Of note, the patient had waited 17 hours prior to being evaluated.  He states he is actually now feeling slightly better than he did when he came into the hospital yesterday afternoon but still unwell.  Differential diagnosis includes, but is not limited to, influenza A, pneumonia, dehydration, electrolyte abnormality, other metabolic etiology, less likely cardiac cause.  Patient's presentation is most consistent with acute presentation with potential threat to life or bodily function.  The patient is on the cardiac monitor to evaluate for evidence of arrhythmia and/or significant heart rate changes.  Vital signs are stable.  The patient was requiring oxygen earlier during his weight, but is now 100% on room air.  Respiratory panel here is positive for flu A.  Chest x-ray also shows a left lower lobe infiltrate.  BMP and CBC show no concerning acute findings likely pneumonia as well as the patient's near syncope and generalized weakness, he will benefit from inpatient admission for further management.  I consulted Dr. Alvester Morin from the hospitalist service; based on our discussion he agrees to evaluate the patient for admission     FINAL CLINICAL IMPRESSION(S) / ED DIAGNOSES   Final diagnoses:  Influenza A  Community acquired pneumonia of left lower lobe of lung     Rx / DC Orders   ED Discharge Orders     None        Note:  This document was prepared using Dragon voice recognition software and may include unintentional dictation errors.    Dionne Bucy, MD 11/01/23 1045    Dionne Bucy,  MD 11/01/23 1054

## 2023-11-01 NOTE — H&P (Signed)
History and Physical    Patient: Mark Sanchez:[id number] DOB: [birth year] DOA: 11/01/2023 DOS: the patient was seen and examined on 11/01/2023 PCP: Bosie Clos, MD  Patient coming from: Home  Chief Complaint:  Chief Complaint  Patient presents with   Nausea   Fatigue   HPI: Mark BRADEEN Sr. is a 89 y.o. male with medical history significant of COPD, gerd, HLD, HTN presenting w/ pna, influenza A. Pt w/ noted worsening malaise over the past 2-3 days. + mild cough and SOB. Was evaluated yesterday and diagnosed w/ influenza A.  Pt w/ worsening resp status at home. + malaise. Minimal wheezes. + decreased po intake, nausea and vomiting. No reported abdominal pain.  Presented to ER afebrile, hemodynamically stable. Satting well on RA. CBC and CMP grossly WNL. CXR w/ L base PNA.  Review of Systems: As mentioned in the history of present illness. All other systems reviewed and are negative. Past Medical History:  Diagnosis Date   Arthritis    left ankle   Brain tumor (benign) (HCC)    COPD (chronic obstructive pulmonary disease) (HCC)    GERD (gastroesophageal reflux disease)    Heart murmur    Hyperlipidemia    Hypertension    Seasonal allergies    Past Surgical History:  Procedure Laterality Date   EUS  03/27/2012   Procedure: UPPER ENDOSCOPIC ULTRASOUND (EUS) LINEAR;  Surgeon: Rachael Fee, MD;  Location: [facility];  Service: Endoscopy;  Laterality: N/A;  [place] EUS   FINE NEEDLE ASPIRATION  03/27/2012   Procedure: FINE NEEDLE ASPIRATION (FNA) LINEAR;  Surgeon: Rachael Fee, MD;  Location: [facility];  Service: Endoscopy;  Laterality: N/A;   HERNIA REPAIR     ORIF ANKLE FRACTURE     Social History:  reports that he quit smoking about 24 years ago. His smoking use included cigars. He has never used smokeless tobacco. He reports current alcohol use. He reports that he does not use drugs.  Allergies  Allergen Reactions   Brimonidine Other (See Comments)    Iodinated Contrast Media Other (See Comments)   Netarsudil Dimesylate     Other reaction(s): Pain in eye   Sulfamethoxazole-Trimethoprim Hives   Sulfa Antibiotics Itching and Rash    Allergic reaction was over 30years ago. Patient unsure of exact reaction    Family History  Problem Relation Age of Onset   Heart disease Mother    Hypertension Mother    Arthritis Mother    Breast cancer Sister    Breast cancer Sister     Prior to Admission medications   Medication Sig Start Date End Date Taking? Authorizing Provider  amLODipine (NORVASC) 5 MG tablet Take 1 tablet (5 mg total) by mouth daily. 09/14/22   Alfredia Ferguson, PA-C  aspirin 81 MG EC tablet Take 1 tablet by mouth daily. 07/21/18   [provider]  Brinzolamide-Brimonidine (SIMBRINZA) 1-0.2 % SUSP Place 3 drops into both eyes daily. As needed    [provider]  carboxymethylcellulose (REFRESH PLUS) 0.5 % SOLN Apply to eye.    [provider]  cetirizine (ZYRTEC) 10 MG tablet Take 10 mg by mouth daily.    [provider]  dorzolamide-timolol (COSOPT) 22.3-6.8 MG/ML ophthalmic solution INSTILL 1 DROP IN RIGHT EYE TWO TIMES A DAY GLAUCOMA 12/22/21   [provider]  fluticasone (FLONASE) 50 MCG/ACT nasal spray TAKE TWO PUFFS EACH NOSTRIL DAILY. 03/08/21   Bosie Clos, MD  metoprolol tartrate (LOPRESSOR) 25 MG  tablet Take 0.5 tablets (12.5 mg total) by mouth 2 (two) times daily. 09/14/22   Drubel, Lillia Abed, PA-C  mometasone (ASMANEX) 220 MCG/INH inhaler Inhale 1 puff into the lungs daily.     [provider]  Multiple Vitamins-Minerals (PRESERVISION AREDS 2) CAPS TAKE  BY MOUTH TWO TIMES A DAY 07/31/19   [provider]  pantoprazole (PROTONIX) 40 MG tablet Take 1 tablet (40 mg total) by mouth 2 (two) times daily. 09/14/22   Alfredia Ferguson, PA-C  pravastatin (PRAVACHOL) 20 MG tablet Take 1 tablet (20 mg total) by mouth nightly 11/30/20   Bosie Clos, MD   sucralfate (CARAFATE) 1 g tablet Take 1 tablet (1 g total) by mouth 4 (four) times daily -  with meals and at bedtime. 05/16/22   Bosie Clos, MD  telmisartan (MICARDIS) 40 MG tablet Take 1 tablet (40 mg total) by mouth daily. 03/05/22   Bosie Clos, MD  terazosin (HYTRIN) 5 MG capsule Take 5 mg by mouth at bedtime.    [provider]  triamcinolone ointment (KENALOG) 0.1 % SMARTSIG:1 Topical Daily PRN 06/26/21   [provider]  Wheat Dextrin (BENEFIBER DRINK MIX PO) Take by mouth.    [provider]    Physical Exam: Vitals:   11/01/23 0053 11/01/23 0632 11/01/23 0926 11/01/23 1012  BP: (!) 105/47 (!) 93/44 (!) 106/47 (!) 126/48  Pulse: 77 62 66 70  Resp: 18 16 18  (!) 24  Temp: 99.8 F (37.7 C) 98.9 F (37.2 C) 98 F (36.7 C)   TempSrc: Oral Oral    SpO2: 92% 94% 94% 100%  Weight:       Physical Exam Constitutional:      Appearance: He is normal weight.  HENT:     Head: Normocephalic and atraumatic.     Nose: Nose normal.     Mouth/Throat:     Mouth: Mucous membranes are moist.  Cardiovascular:     Rate and Rhythm: Normal rate and regular rhythm.  Pulmonary:     Effort: Pulmonary effort is normal.  Abdominal:     General: Bowel sounds are normal.  Musculoskeletal:        General: Normal range of motion.  Skin:    General: Skin is warm.  Neurological:     General: No focal deficit present.  Psychiatric:        Mood and Affect: Mood normal.     Data Reviewed:  There are no new results to review at this time.  DG Chest 2 View CLINICAL DATA:  Hypoxia  EXAM: CHEST - 2 VIEW  COMPARISON:  05/17/2016  FINDINGS: Mild left basilar opacity, suspicious for pneumonia. No definite pleural effusions. No pneumothorax.  The heart is normal in size.  Thoracic aortic atherosclerosis.  IMPRESSION: Mild left basilar opacity, suspicious for pneumonia.  Electronically Signed   By: Charline Bills M.D.   On: 10/31/2023  19:04  Lab Results  Component Value Date   WBC 9.2 10/31/2023   HGB 12.4 (L) 10/31/2023   HCT 36.7 (L) 10/31/2023   MCV 93.4 10/31/2023   PLT 147 (L) 10/31/2023   Last metabolic panel Lab Results  Component Value Date   GLUCOSE 145 (H) 11/01/2023   NA 133 (L) 11/01/2023   K 4.1 11/01/2023   CL 100 11/01/2023   CO2 21 (L) 11/01/2023   BUN 26 (H) 11/01/2023   CREATININE 1.33 (H) 11/01/2023   GFRNONAA 51 (L) 11/01/2023   CALCIUM 8.3 (L) 11/01/2023  PROT 6.8 09/14/2022   ALBUMIN 4.3 09/14/2022   LABGLOB 2.5 09/14/2022   AGRATIO 1.7 09/14/2022   BILITOT 0.6 09/14/2022   ALKPHOS 74 09/14/2022   AST 16 09/14/2022   ALT 9 09/14/2022   ANIONGAP 12 11/01/2023    Assessment and Plan: * PNA (pneumonia) Baseline influenza A w/ overlapping lobar PNA  No hypoxia on presentation  IV rocephin and azithromcyin for infectious coverage  Blood and resp cultures  Tamiflu  Prednisone in setting of baseline COPD- minimal wheezing at present  Prn duonebs  Follow    COPD (chronic obstructive pulmonary disease) (HCC) Minimal wheezing in setting of influenza A and PNA  Prn duonebs  Oral prednisone prophylactically  Follow closely   Chronic kidney disease Cr 1.33 w/ GFR in 50s Monitor   Hyperlipidemia, unspecified Cont statin    Hypertension BP stable  Titrate home regimen        Advance Care Planning:   Code Status: Full Code   Consults: None   Family Communication: Son at the bedside   Severity of Illness: The appropriate patient status for this patient is INPATIENT. Inpatient status is judged to be reasonable and necessary in order to provide the required intensity of service to ensure the patient's safety. The patient's presenting symptoms, physical exam findings, and initial radiographic and laboratory data in the context of their chronic comorbidities is felt to place them at high risk for further clinical deterioration. Furthermore, it is not anticipated that the  patient will be medically stable for discharge from the hospital within 2 midnights of admission.   * I certify that at the point of admission it is my clinical judgment that the patient will require inpatient hospital care spanning beyond 2 midnights from the point of admission due to high intensity of service, high risk for further deterioration and high frequency of surveillance required.*  Author: Floydene Flock, MD 11/01/2023 11:30 AM  For on call review [email or web address].

## 2023-11-01 NOTE — Assessment & Plan Note (Signed)
Cr 1.33 w/ GFR in 50s Monitor

## 2023-11-01 NOTE — Progress Notes (Signed)
Mobility Specialist - Progress Note     11/01/23 1623  Mobility  Activity Ambulated with assistance in hallway;Stood at bedside;Dangled on edge of bed  Level of Assistance Contact guard assist, steadying assist  Assistive Device None  Distance Ambulated (ft) 160 ft  Range of Motion/Exercises Active  Activity Response Tolerated well  Mobility Referral Yes  Mobility visit 1 Mobility   Pt resting in bed on RA upon entry. Pt ambuolates around ED department CGA with no RW. Pt close CGA due to multiple brief bouts of LOB, patient quickly recovered each time. Pt returned to bed and left with needs in reach. Son and RN present at bedside.   Johnathan Hausen Mobility Specialist 11/02/23, 10:39 AM

## 2023-11-01 NOTE — Progress Notes (Signed)
PHARMACY NOTE:  ANTIMICROBIAL RENAL DOSAGE ADJUSTMENT  Current antimicrobial regimen includes a mismatch between antimicrobial dosage and estimated renal function.  As per policy approved by the Pharmacy & Therapeutics and Medical Executive Committees, the antimicrobial dosage will be adjusted accordingly.  Current antimicrobial dosage:  Oseltamivir 75 mg twice daily  Indication: Influenza A  Renal Function:  Estimated Creatinine Clearance: 38.2 mL/min (A) (by C-G formula based on SCr of 1.33 mg/dL (H)). []      On intermittent HD, scheduled: []      On CRRT    Antimicrobial dosage has been changed to:  Oseltamivir 75 mg x 1 dose, followed by 30 mg twice daily  Additional comments:   Thank you for allowing pharmacy to be a part of this patient's care.  Will M. Dareen Piano, PharmD Clinical Pharmacist 11/01/2023 1:50 PM

## 2023-11-01 NOTE — Evaluation (Signed)
Occupational Therapy Evaluation Patient Details Name: Mark ATA Sr. MRN: [id number] DOB: [birth year] Today's Date: 11/01/2023   History of Present Illness Mark SULEWSKI Sr. is a 89 y.o. male with medical history significant of COPD, gerd, HLD, HTN presenting w/ pna, influenza A. Pt w/ noted worsening malaise over the past 2-3 days. + mild cough and SOB. Was evaluated yesterday and diagnosed w/ influenza A   Clinical Impression   Mark Sanchez was seen for OT evaluation this date. Prior to hospital admission, pt was IND. Pt lives with spouse who pt is currently caregiver for 2/2 recent fracture. Pt currently requires SBA + RW for toilet t/f and SUPERIVISON standing hand washing. MOD I for LB access in sitting. Per pt and son at bedside, pt appears near baseline, may benefit from 4WW on d/c. Educated on falls prevention, all education complete, will sign off. Upon hospital discharge, recommend no OT follow up.     If plan is discharge home, recommend the following: Help with stairs or ramp for entrance    Functional Status Assessment  Patient has had a recent decline in their functional status and demonstrates the ability to make significant improvements in function in a reasonable and predictable amount of time.  Equipment Recommendations  Other (comment) (rollator)    Recommendations for Other Services       Precautions / Restrictions Precautions Precautions: None Restrictions Weight Bearing Restrictions Per Provider Order: No      Mobility Bed Mobility Overal bed mobility: Independent                  Transfers Overall transfer level: Independent                        Balance Overall balance assessment: No apparent balance deficits (not formally assessed)                                         ADL either performed or assessed with clinical judgement   ADL Overall ADL's : Needs assistance/impaired                                        General ADL Comments: SBA + RW for toilet t/f and SUPERIVISON standing hand washing. MOD I for LB access in sitting      Pertinent Vitals/Pain Pain Assessment Pain Assessment: No/denies pain     Extremity/Trunk Assessment Upper Extremity Assessment Upper Extremity Assessment: Overall WFL for tasks assessed   Lower Extremity Assessment Lower Extremity Assessment: Generalized weakness       Communication Communication Communication: No apparent difficulties   Cognition Arousal: Alert Behavior During Therapy: WFL for tasks assessed/performed Overall Cognitive Status: Within Functional Limits for tasks assessed                                                  Home Living Family/patient expects to be discharged to:: Private residence Living Arrangements: Spouse/significant other Available Help at Discharge: Family Type of Home: House Home Access: Stairs to enter Secretary/administrator of Steps: 5 Entrance Stairs-Rails: Right Home Layout: One level  Home Equipment: None   Additional Comments: spouse recently had a fracture and pt is caregiver      Prior Functioning/Environment Prior Level of Function : Independent/Modified Independent                        OT Problem List: Decreased strength;Decreased range of motion;Decreased activity tolerance;Impaired balance (sitting and/or standing)         OT Goals(Current goals can be found in the care plan section) Acute Rehab OT Goals Patient Stated Goal: to go home OT Goal Formulation: With patient/family Time For Goal Achievement: 11/15/23 Potential to Achieve Goals: Good   AM-PAC OT "6 Clicks" Daily Activity     Outcome Measure Help from another person eating meals?: None Help from another person taking care of personal grooming?: None Help from another person toileting, which includes using toliet, bedpan, or urinal?: None Help from another  person bathing (including washing, rinsing, drying)?: A Little Help from another person to put on and taking off regular upper body clothing?: None Help from another person to put on and taking off regular lower body clothing?: None 6 Click Score: 23   End of Session Equipment Utilized During Treatment: Rolling walker (2 wheels)  Activity Tolerance: Patient tolerated treatment well Patient left: in bed;with call bell/phone within reach;with nursing/sitter in room;with family/visitor present  OT Visit Diagnosis: Other abnormalities of gait and mobility (R26.89);Muscle weakness (generalized) (M62.81)                Time: 1610-9604 OT Time Calculation (min): 21 min Charges:  OT General Charges $OT Visit: 1 Visit OT Evaluation $OT Eval Low Complexity: 1 Low OT Treatments $Self Care/Home Management : 8-22 mins  Mark Sanchez, M.S. OTR/L  11/01/23, 1:30 PM  ascom [phone]

## 2023-11-01 NOTE — Assessment & Plan Note (Signed)
Minimal wheezing in setting of influenza A and PNA  Prn duonebs  Oral prednisone prophylactically  Follow closely

## 2023-11-01 NOTE — Assessment & Plan Note (Signed)
Cont statin

## 2023-11-01 NOTE — Assessment & Plan Note (Signed)
BP stable Titrate home regimen 

## 2023-11-01 NOTE — Assessment & Plan Note (Signed)
Baseline influenza A w/ overlapping lobar PNA  No hypoxia on presentation  IV rocephin and azithromcyin for infectious coverage  Blood and resp cultures  Tamiflu  Prednisone in setting of baseline COPD- minimal wheezing at present  Prn duonebs  Follow

## 2023-11-02 DIAGNOSIS — J09X1 Influenza due to identified novel influenza A virus with pneumonia: Secondary | ICD-10-CM

## 2023-11-02 LAB — COMPREHENSIVE METABOLIC PANEL
ALT: 17 U/L (ref 0–44)
AST: 29 U/L (ref 15–41)
Albumin: 3 g/dL — ABNORMAL LOW (ref 3.5–5.0)
Alkaline Phosphatase: 45 U/L (ref 38–126)
Anion gap: 8 (ref 5–15)
BUN: 24 mg/dL — ABNORMAL HIGH (ref 8–23)
CO2: 20 mmol/L — ABNORMAL LOW (ref 22–32)
Calcium: 8.1 mg/dL — ABNORMAL LOW (ref 8.9–10.3)
Chloride: 109 mmol/L (ref 98–111)
Creatinine, Ser: 0.95 mg/dL (ref 0.61–1.24)
GFR, Estimated: >60 mL/min (ref >60)
Glucose, Bld: 98 mg/dL (ref 70–99)
Potassium: 3.9 mmol/L (ref 3.5–5.1)
Sodium: 137 mmol/L (ref 135–145)
Total Bilirubin: 0.7 mg/dL (ref 0.0–1.2)
Total Protein: 5.8 g/dL — ABNORMAL LOW (ref 6.5–8.1)

## 2023-11-02 LAB — CBC
HCT: 36.6 % — ABNORMAL LOW (ref 39.0–52.0)
Hemoglobin: 12.5 g/dL — ABNORMAL LOW (ref 13.0–17.0)
MCH: 31.3 pg (ref 26.0–34.0)
MCHC: 34.2 g/dL (ref 30.0–36.0)
MCV: 91.5 fL (ref 80.0–100.0)
Platelets: 126 10*3/uL — ABNORMAL LOW (ref 150–400)
RBC: 4 MIL/uL — ABNORMAL LOW (ref 4.22–5.81)
RDW: 13.2 % (ref 11.5–15.5)
WBC: 8.9 10*3/uL (ref 4.0–10.5)
nRBC: 0 % (ref 0.0–0.2)

## 2023-11-02 MED ORDER — OSELTAMIVIR PHOSPHATE 30 MG PO CAPS: 30.0000 mg | ORAL_CAPSULE | Freq: Two times a day (BID) | ORAL | Status: AC

## 2023-11-02 MED ORDER — ONDANSETRON HCL 4 MG/2ML IJ SOLN
4.0000 mg | Freq: Four times a day (QID) | INTRAMUSCULAR | Status: DC | PRN
  Administered 2023-11-02: 4 mg via INTRAVENOUS
  Filled 2023-11-02: qty 2

## 2023-11-02 NOTE — Evaluation (Signed)
Physical Therapy Evaluation Patient Details Name: Mark HABERKORN Sr. MRN: [id number] DOB: 1933-10-06 Today's Date: 11/02/2023  History of Present Illness  Pt is an 89 y.o. male presenting to hospital 11/01/23 with c/o SOB, generalized weakness, cough, and diarrhea.  (+) Influenza A.  Collapsed onto floor in bathroom at home (did not completely pass out).  Pt admitted with PNA.  PMH includes COPD, HLD, htn, benign brain tumor, ORIF ankle fx.  Clinical Impression  Prior to recent medical concerns, pt was independent with ambulation; lives with his wife (pt currently caregiver for his spouse d/t her recent LE fx--wife s/p surgery and ambulatory within home).  No c/o pain during session.  Currently pt is modified independent with bed mobility; SBA with transfer from bed; and CGA to ambulate 260 feet (no AD use).  Pt appearing more cautious with ambulation (d/t generalized weakness) but no loss of balance noted.  During session pt's HR 55-70 bpm and SpO2 sats 97% or greater on room air.   Pt would currently benefit from skilled PT to address noted impairments and functional limitations (see below for any additional details).  Upon hospital discharge, pt would benefit from ongoing therapy.     If plan is discharge home, recommend the following: A little help with walking and/or transfers;A little help with bathing/dressing/bathroom;Assistance with cooking/housework;Assist for transportation;Help with stairs or ramp for entrance   Can travel by private vehicle    Yes    Equipment Recommendations None recommended by PT  Recommendations for Other Services       Functional Status Assessment Patient has had a recent decline in their functional status and demonstrates the ability to make significant improvements in function in a reasonable and predictable amount of time.     Precautions / Restrictions Precautions Precautions: Fall Restrictions Weight Bearing Restrictions Per Provider Order: No       Mobility  Bed Mobility Overal bed mobility: Modified Independent             General bed mobility comments: Semi-supine to/from sitting EOB; HOB elevated    Transfers Overall transfer level: Needs assistance Equipment used: None Transfers: Sit to/from Stand Sit to Stand: Supervision           General transfer comment: mild increased effort to stand from bed but steady    Ambulation/Gait Ambulation/Gait assistance: Contact guard assist Gait Distance (Feet): 260 Feet Assistive device: None Gait Pattern/deviations: Step-through pattern, Decreased step length - right, Decreased step length - left Gait velocity: decreased     General Gait Details: pt appearing more cautious with ambulation (d/t generalized weakness) but overall steady  Stairs            Wheelchair Mobility     Tilt Bed    Modified Rankin (Stroke Patients Only)       Balance Overall balance assessment: Needs assistance Sitting-balance support: No upper extremity supported, Feet supported Sitting balance-Leahy Scale: Normal Sitting balance - Comments: steady reaching outside BOS   Standing balance support: No upper extremity supported, During functional activity Standing balance-Leahy Scale: Good Standing balance comment: pt appearing more cautious with ambulation but no loss of balance noted                             Pertinent Vitals/Pain Pain Assessment Pain Assessment: No/denies pain    Home Living Family/patient expects to be discharged to:: Private residence Living Arrangements: Spouse/significant other Available Help at Discharge: Family Type of  Home: House Home Access: Stairs to enter Entrance Stairs-Rails: Doctor, general practice of Steps: 5   Home Layout: One level Home Equipment: Grab bars - tub/shower;Shower seat (pt's son reports family member is obtaining a RW for pt (should arrive to pt's home soon)) Additional Comments: Pt is caregiver  for his spouse who recently had a LE fx (s/p repair and is ambulatory at home).    Prior Function Prior Level of Function : Independent/Modified Independent             Mobility Comments: Independent with ambulation; no recent falls reported.       Extremity/Trunk Assessment   Upper Extremity Assessment Upper Extremity Assessment: Overall WFL for tasks assessed    Lower Extremity Assessment Lower Extremity Assessment: Generalized weakness    Cervical / Trunk Assessment Cervical / Trunk Assessment: Normal  Communication   Communication Communication: No apparent difficulties Cueing Techniques: Verbal cues  Cognition Arousal: Alert Behavior During Therapy: WFL for tasks assessed/performed Overall Cognitive Status: Within Functional Limits for tasks assessed                                          General Comments  Nursing cleared pt for participation in physical therapy.  Pt agreeable to PT session.  Pt's son present during session.    Exercises     Assessment/Plan    PT Assessment Patient needs continued PT services  PT Problem List Decreased strength;Decreased activity tolerance;Decreased balance;Decreased mobility       PT Treatment Interventions DME instruction;Gait training;Stair training;Functional mobility training;Therapeutic activities;Therapeutic exercise;Balance training;Patient/family education    PT Goals (Current goals can be found in the Care Plan section)  Acute Rehab PT Goals Patient Stated Goal: to improve overall strength PT Goal Formulation: With patient Time For Goal Achievement: 11/16/23 Potential to Achieve Goals: Good    Frequency Min 1X/week     Co-evaluation               AM-PAC PT "6 Clicks" Mobility  Outcome Measure Help needed turning from your back to your side while in a flat bed without using bedrails?: None Help needed moving from lying on your back to sitting on the side of a flat bed without  using bedrails?: None Help needed moving to and from a bed to a chair (including a wheelchair)?: A Little Help needed standing up from a chair using your arms (e.g., wheelchair or bedside chair)?: A Little Help needed to walk in hospital room?: A Little Help needed climbing 3-5 steps with a railing? : A Little 6 Click Score: 20    End of Session Equipment Utilized During Treatment: Gait belt Activity Tolerance: Patient tolerated treatment well Patient left: in bed;with call bell/phone within reach;with bed alarm set;with family/visitor present Nurse Communication: Mobility status;Precautions PT Visit Diagnosis: Other abnormalities of gait and mobility (R26.89);Muscle weakness (generalized) (M62.81);History of falling (Z91.81)    Time: 4401-0272 PT Time Calculation (min) (ACUTE ONLY): 20 min   Charges:   PT Evaluation $PT Eval Low Complexity: 1 Low PT Treatments $Therapeutic Activity: 8-22 mins PT General Charges $$ ACUTE PT VISIT: 1 Visit        Hendricks Limes, PT 11/02/23, 1:04 PM

## 2023-11-02 NOTE — Plan of Care (Signed)

## 2023-11-02 NOTE — Discharge Summary (Signed)
Mark KUEHNER Sr. SAY:[id number] DOB: [birth year] DOA: 11/01/2023  PCP: Bosie Clos, MD  Admit date: 11/01/2023 Discharge date: 11/02/2023  Time spent: 35 minutes  Recommendations for Outpatient Follow-up:  Pcp f/u     Discharge Diagnoses:  Principal Problem:   Influenza A with pneumonia Active Problems:   PNA (pneumonia)   Hypertension   Hyperlipidemia, unspecified   Mild cognitive disorder   Chronic kidney disease   COPD (chronic obstructive pulmonary disease) (HCC)   Discharge Condition: improved  Diet recommendation: heart healthy  Filed Weights   10/31/23 1755  Weight: 70.3 kg    History of present illness:  From admission h and p Mark Schneiders Sr. is a 89 y.o. male with medical history significant of COPD, gerd, HLD, HTN presenting w/ pna, influenza A. Pt w/ noted worsening malaise over the past 2-3 days. + mild cough and SOB. Was evaluated yesterday and diagnosed w/ influenza A.  Pt w/ worsening resp status at home. + malaise. Minimal wheezes. + decreased po intake, nausea and vomiting. No reported abdominal pain.   Hospital Course:  Patient presents with several days malaise and fever. Diagnosed with influenza day prior to admission, we confirmed that diagnosis. Cxr with mild left basilar opacity. Tamiflu started, also abx (ceftriaxone and azithromycin) and prednisone given smoking history. Patient feeling much improved, vitals wnl no hypoxia, ambulated satisfactorily with PT, labs stable. Patient actually denies any respiratory symptoms during this disease course and says copd was a diagnosis given years ago but hasn't required inhaler or had respiratory symptoms for decades. No wheeze or cough. This appears to be plain influenza, will continue tamiflu and push fluids, hold additional abx and steroids as does not appear to have bacterial infection or copd exacerbation. Bps wnl here with holding home bp meds, advise to continue to hold those until symptoms  resolved. Infection control and return to work instructions given.   Procedures: none   Consultations: none  Discharge Exam: Vitals:   11/02/23 0322 11/02/23 0800  BP: 137/71 120/61  Pulse: 83 79  Resp: 16 18  Temp: 98.1 F (36.7 C) 98.3 F (36.8 C)  SpO2: 95% 96%    General: NAD Cardiovascular: RRR Respiratory: CTAB  Discharge Instructions   Discharge Instructions     Diet - low sodium heart healthy   Complete by: As directed    Increase activity slowly   Complete by: As directed       Allergies as of 11/02/2023       Reactions   Brimonidine Other (See Comments)   Iodinated Contrast Media Other (See Comments)   Netarsudil Dimesylate    Other reaction(s): Pain in eye   Sulfamethoxazole-trimethoprim Hives   Sulfa Antibiotics Itching, Rash   Allergic reaction was over 30years ago. Patient unsure of exact reaction        Medication List     PAUSE taking these medications    amLODipine 5 MG tablet Wait to take this until: November 05, 2023 Commonly known as: NORVASC Take 1 tablet (5 mg total) by mouth daily.   telmisartan 40 MG tablet Wait to take this until: November 05, 2023 Commonly known as: MICARDIS Take 1 tablet (40 mg total) by mouth daily.   terazosin 5 MG capsule Wait to take this until: November 05, 2023 Commonly known as: HYTRIN Take 5 mg by mouth at bedtime.       TAKE these medications    aspirin EC 81 MG tablet Take 1 tablet by mouth  daily.   BENEFIBER DRINK MIX PO Take by mouth.   carboxymethylcellulose 0.5 % Soln Commonly known as: REFRESH PLUS Apply to eye.   cetirizine 10 MG tablet Commonly known as: ZYRTEC Take 10 mg by mouth daily.   dorzolamide-timolol 2-0.5 % ophthalmic solution Commonly known as: COSOPT INSTILL 1 DROP IN RIGHT EYE TWO TIMES A DAY GLAUCOMA   fluticasone 50 MCG/ACT nasal spray Commonly known as: FLONASE TAKE TWO PUFFS EACH NOSTRIL DAILY.   metoprolol tartrate 25 MG tablet Commonly known as:  LOPRESSOR Take 0.5 tablets (12.5 mg total) by mouth 2 (two) times daily.   mometasone 220 MCG/INH inhaler Commonly known as: ASMANEX Inhale 1 puff into the lungs daily.   oseltamivir 30 MG capsule Commonly known as: TAMIFLU Take 1 capsule (30 mg total) by mouth 2 (two) times daily for 4 days.   pantoprazole 40 MG tablet Commonly known as: PROTONIX Take 1 tablet (40 mg total) by mouth 2 (two) times daily.   pravastatin 20 MG tablet Commonly known as: PRAVACHOL Take 1 tablet (20 mg total) by mouth nightly   PreserVision AREDS 2 Caps TAKE  BY MOUTH TWO TIMES A DAY   Simbrinza 1-0.2 % Susp Generic drug: Brinzolamide-Brimonidine Place 3 drops into both eyes daily. As needed   sucralfate 1 g tablet Commonly known as: Carafate Take 1 tablet (1 g total) by mouth 4 (four) times daily -  with meals and at bedtime.   triamcinolone ointment 0.1 % Commonly known as: KENALOG SMARTSIG:1 Topical Daily PRN       Allergies  Allergen Reactions   Brimonidine Other (See Comments)   Iodinated Contrast Media Other (See Comments)   Netarsudil Dimesylate     Other reaction(s): Pain in eye   Sulfamethoxazole-Trimethoprim Hives   Sulfa Antibiotics Itching and Rash    Allergic reaction was over 30years ago. Patient unsure of exact reaction    Follow-up Information     Bosie Clos, MD Follow up.   Specialty: Family Medicine Contact information: [address] [place] [address] [phone]                  The results of significant diagnostics from this hospitalization (including imaging, microbiology, ancillary and laboratory) are listed below for reference.    Significant Diagnostic Studies: DG Chest 2 View Result Date: 10/31/2023 CLINICAL DATA:  Hypoxia EXAM: CHEST - 2 VIEW COMPARISON:  05/17/2016 FINDINGS: Mild left basilar opacity, suspicious for pneumonia. No definite pleural effusions. No pneumothorax. The heart is normal in size.  Thoracic aortic  atherosclerosis. IMPRESSION: Mild left basilar opacity, suspicious for pneumonia. Electronically Signed   By: Charline Bills M.D.   On: 10/31/2023 19:04    Microbiology: Recent Results (from the past 240 hours)  Resp panel by RT-PCR (RSV, Flu A&B, Covid) Anterior Nasal Swab     Status: Abnormal   Collection Time: 10/31/23  6:00 PM   Specimen: Anterior Nasal Swab  Result Value Ref Range Status   SARS Coronavirus 2 by RT PCR NEGATIVE NEGATIVE Final    Comment: (NOTE) SARS-CoV-2 target nucleic acids are NOT DETECTED.  The SARS-CoV-2 RNA is generally detectable in upper respiratory specimens during the acute phase of infection. The lowest concentration of SARS-CoV-2 viral copies this assay can detect is 138 copies/mL. A negative result does not preclude SARS-Cov-2 infection and should not be used as the sole basis for treatment or other patient management decisions. A negative result may occur with  improper specimen collection/handling, submission of  specimen other than nasopharyngeal swab, presence of viral mutation(s) within the areas targeted by this assay, and inadequate number of viral copies(<138 copies/mL). A negative result must be combined with clinical observations, patient history, and epidemiological information. The expected result is Negative.  Fact Sheet for Patients:  BloggerCourse.com  Fact Sheet for Healthcare Providers:  SeriousBroker.it  This test is no t yet approved or cleared by the [place] FDA and  has been authorized for detection and/or diagnosis of SARS-CoV-2 by FDA under an Emergency Use Authorization (EUA). This EUA will remain  in effect (meaning this test can be used) for the duration of the COVID-19 declaration under Section 564(b)(1) of the Act, 21 U.S.C.section 360bbb-3(b)(1), unless the authorization is terminated  or revoked sooner.       Influenza A by PCR POSITIVE (A) NEGATIVE Final    Influenza B by PCR NEGATIVE NEGATIVE Final    Comment: (NOTE) The Xpert Xpress SARS-CoV-2/FLU/RSV plus assay is intended as an aid in the diagnosis of influenza from Nasopharyngeal swab specimens and should not be used as a sole basis for treatment. Nasal washings and aspirates are unacceptable for Xpert Xpress SARS-CoV-2/FLU/RSV testing.  Fact Sheet for Patients: BloggerCourse.com  Fact Sheet for Healthcare Providers: SeriousBroker.it  This test is not yet approved or cleared by the [place] FDA and has been authorized for detection and/or diagnosis of SARS-CoV-2 by FDA under an Emergency Use Authorization (EUA). This EUA will remain in effect (meaning this test can be used) for the duration of the COVID-19 declaration under Section 564(b)(1) of the Act, 21 U.S.C. section 360bbb-3(b)(1), unless the authorization is terminated or revoked.     Resp Syncytial Virus by PCR NEGATIVE NEGATIVE Final    Comment: (NOTE) Fact Sheet for Patients: BloggerCourse.com  Fact Sheet for Healthcare Providers: SeriousBroker.it  This test is not yet approved or cleared by the [place] FDA and has been authorized for detection and/or diagnosis of SARS-CoV-2 by FDA under an Emergency Use Authorization (EUA). This EUA will remain in effect (meaning this test can be used) for the duration of the COVID-19 declaration under Section 564(b)(1) of the Act, 21 U.S.C. section 360bbb-3(b)(1), unless the authorization is terminated or revoked.  Performed at [facility], [address] [place], [place] [address]      Labs: Basic Metabolic Panel: Recent Labs  Lab 11/01/23 0300 11/02/23 0622  NA 133* 137  K 4.1 3.9  CL 100 109  CO2 21* 20*  GLUCOSE 145* 98  BUN 26* 24*  CREATININE 1.33* 0.95  CALCIUM 8.3* 8.1*   Liver Function Tests: Recent Labs  Lab 11/02/23 0622  AST  29  ALT 17  ALKPHOS 45  BILITOT 0.7  PROT 5.8*  ALBUMIN 3.0*   Recent Labs  Lab 11/01/23 0300  LIPASE 26   No results for input(s): "AMMONIA" in the last 168 hours. CBC: Recent Labs  Lab 10/31/23 1800 11/02/23 0622  WBC 9.2 8.9  NEUTROABS 7.2  --   HGB 12.4* 12.5*  HCT 36.7* 36.6*  MCV 93.4 91.5  PLT 147* 126*   Cardiac Enzymes: No results for input(s): "CKTOTAL", "CKMB", "CKMBINDEX", "TROPONINI" in the last 168 hours. BNP: BNP (last 3 results) No results for input(s): "BNP" in the last 8760 hours.  ProBNP (last 3 results) No results for input(s): "PROBNP" in the last 8760 hours.  CBG: No results for input(s): "GLUCAP" in the last 168 hours.     Signed:  Silvano Bilis MD.  [facility]  11/02/2023, 1:22 PM

## 2023-11-02 NOTE — Care Management Obs Status (Signed)
MEDICARE OBSERVATION STATUS NOTIFICATION   Patient Details  Name: Mark Sanchez. MRN: [id number] Date of Birth: January 24, 1933   Medicare Observation Status Notification Given:  Yes  Patient is on isolation. Reviewed MOON with patient. Patient verbalized understanding. Hard copy provided for patient.  Ozie Lupe E Chamara Dyck, LCSW 11/02/2023, 11:42 AM

## 2023-11-02 NOTE — Plan of Care (Signed)
  Problem: Health Behavior/Discharge Planning: Goal: Ability to manage health-related needs will improve Outcome: Progressing   

## 2023-11-03 LAB — LEGIONELLA PNEUMOPHILA SEROGP 1 UR AG: L. pneumophila Serogp 1 Ur Ag: NEGATIVE
